# Patient Record
Sex: Male | Born: 1935 | Race: White | Hispanic: No | Smoking: Former smoker
Health system: Southern US, Community
[De-identification: ages and names within clinical notes are randomized; demographics above are authoritative.]

## PROBLEM LIST (undated history)

## (undated) DIAGNOSIS — E785 Hyperlipidemia, unspecified: Secondary | ICD-10-CM

## (undated) DIAGNOSIS — Z947 Corneal transplant status: Secondary | ICD-10-CM

## (undated) DIAGNOSIS — H2010 Chronic iridocyclitis, unspecified eye: Secondary | ICD-10-CM

## (undated) DIAGNOSIS — M25559 Pain in unspecified hip: Secondary | ICD-10-CM

## (undated) DIAGNOSIS — H5702 Anisocoria: Secondary | ICD-10-CM

## (undated) DIAGNOSIS — I63239 Cerebral infarction due to unspecified occlusion or stenosis of unspecified carotid arteries: Secondary | ICD-10-CM

## (undated) DIAGNOSIS — I10 Essential (primary) hypertension: Principal | ICD-10-CM

## (undated) DIAGNOSIS — H1851 Endothelial corneal dystrophy: Secondary | ICD-10-CM

## (undated) DIAGNOSIS — G459 Transient cerebral ischemic attack, unspecified: Secondary | ICD-10-CM

## (undated) DIAGNOSIS — R202 Paresthesia of skin: Secondary | ICD-10-CM

## (undated) DIAGNOSIS — L821 Other seborrheic keratosis: Secondary | ICD-10-CM

## (undated) DIAGNOSIS — H908 Mixed conductive and sensorineural hearing loss, unspecified: Secondary | ICD-10-CM

## (undated) DIAGNOSIS — R55 Syncope and collapse: Secondary | ICD-10-CM

## (undated) DIAGNOSIS — I498 Other specified cardiac arrhythmias: Secondary | ICD-10-CM

## (undated) DIAGNOSIS — Z961 Presence of intraocular lens: Secondary | ICD-10-CM

## (undated) DIAGNOSIS — I739 Peripheral vascular disease, unspecified: Secondary | ICD-10-CM

## (undated) DIAGNOSIS — H18519 Endothelial corneal dystrophy, unspecified eye: Secondary | ICD-10-CM

## (undated) DIAGNOSIS — R351 Nocturia: Secondary | ICD-10-CM

## (undated) DIAGNOSIS — H35359 Cystoid macular degeneration, unspecified eye: Secondary | ICD-10-CM

## (undated) DIAGNOSIS — L570 Actinic keratosis: Secondary | ICD-10-CM

## (undated) DIAGNOSIS — M542 Cervicalgia: Secondary | ICD-10-CM

## (undated) DIAGNOSIS — H401190 Primary open-angle glaucoma, unspecified eye, stage unspecified: Secondary | ICD-10-CM

## (undated) DIAGNOSIS — L719 Rosacea, unspecified: Secondary | ICD-10-CM

## (undated) DIAGNOSIS — M2040 Other hammer toe(s) (acquired), unspecified foot: Secondary | ICD-10-CM

## (undated) DIAGNOSIS — H353132 Nonexudative age-related macular degeneration, bilateral, intermediate dry stage: Secondary | ICD-10-CM

## (undated) DIAGNOSIS — R350 Frequency of micturition: Secondary | ICD-10-CM

## (undated) HISTORY — DX: Syncope and collapse: R55

## (undated) HISTORY — DX: Presence of intraocular lens: Z96.1

## (undated) HISTORY — DX: Essential (primary) hypertension: I10

## (undated) HISTORY — DX: Endothelial corneal dystrophy, unspecified eye: H18.519

## (undated) HISTORY — DX: Rosacea, unspecified: L71.9

## (undated) HISTORY — DX: Mixed conductive and sensorineural hearing loss, unspecified: H90.8

## (undated) HISTORY — PX: VASECTOMY: SHX75

## (undated) HISTORY — DX: Corneal transplant status: Z94.7

## (undated) HISTORY — DX: Frequency of micturition: R35.0

## (undated) HISTORY — DX: Nonexudative age-related macular degeneration, bilateral, intermediate dry stage: H35.3132

## (undated) HISTORY — DX: Anisocoria: H57.02

## (undated) HISTORY — DX: Peripheral vascular disease, unspecified: I73.9

## (undated) HISTORY — PX: CORNEAL TRANSPLANT: SHX108

## (undated) HISTORY — DX: Endothelial corneal dystrophy: H18.51

## (undated) HISTORY — DX: Hyperlipidemia, unspecified: E78.5

## (undated) HISTORY — DX: Nocturia: R35.1

## (undated) HISTORY — DX: Actinic keratosis: L57.0

## (undated) HISTORY — DX: Pain in unspecified hip: M25.559

## (undated) HISTORY — DX: Primary open-angle glaucoma, unspecified eye, stage unspecified: H40.1190

## (undated) HISTORY — DX: Cystoid macular degeneration, unspecified eye: H35.359

## (undated) HISTORY — DX: Cerebral infarction due to unspecified occlusion or stenosis of unspecified carotid artery: I63.239

## (undated) HISTORY — DX: Chronic iridocyclitis, unspecified eye: H20.10

## (undated) HISTORY — DX: Other hammer toe(s) (acquired), unspecified foot: M20.40

## (undated) HISTORY — DX: Cervicalgia: M54.2

## (undated) HISTORY — DX: Other specified cardiac arrhythmias: I49.8

## (undated) HISTORY — DX: Paresthesia of skin: R20.2

## (undated) HISTORY — DX: Transient cerebral ischemic attack, unspecified: G45.9

## (undated) HISTORY — PX: TONSILLECTOMY: SHX5217

## (undated) HISTORY — DX: Other seborrheic keratosis: L82.1

---

## 1983-07-21 HISTORY — PX: HERNIA REPAIR: SHX51

## 2003-07-21 HISTORY — PX: CATARACT EXTRACTION: SUR2

## 2003-07-21 HISTORY — PX: SHOULDER DEBRIDEMENT: SHX1052

## 2003-10-19 HISTORY — PX: TRANSURETHRAL RESECTION OF PROSTATE: SHX73

## 2004-03-20 HISTORY — PX: HERNIA REPAIR: SHX51

## 2011-09-15 DIAGNOSIS — M25569 Pain in unspecified knee: Secondary | ICD-10-CM | POA: Diagnosis not present

## 2011-09-15 DIAGNOSIS — I10 Essential (primary) hypertension: Secondary | ICD-10-CM | POA: Diagnosis not present

## 2011-09-25 DIAGNOSIS — H401190 Primary open-angle glaucoma, unspecified eye, stage unspecified: Secondary | ICD-10-CM | POA: Insufficient documentation

## 2011-09-25 DIAGNOSIS — H4011X0 Primary open-angle glaucoma, stage unspecified: Secondary | ICD-10-CM | POA: Diagnosis not present

## 2011-09-25 DIAGNOSIS — H409 Unspecified glaucoma: Secondary | ICD-10-CM | POA: Diagnosis not present

## 2011-09-25 HISTORY — DX: Primary open-angle glaucoma, unspecified eye, stage unspecified: H40.1190

## 2011-10-26 DIAGNOSIS — H4011X0 Primary open-angle glaucoma, stage unspecified: Secondary | ICD-10-CM | POA: Diagnosis not present

## 2011-10-26 DIAGNOSIS — H409 Unspecified glaucoma: Secondary | ICD-10-CM | POA: Diagnosis not present

## 2011-11-13 DIAGNOSIS — M2040 Other hammer toe(s) (acquired), unspecified foot: Secondary | ICD-10-CM | POA: Diagnosis not present

## 2011-11-23 DIAGNOSIS — L570 Actinic keratosis: Secondary | ICD-10-CM | POA: Diagnosis not present

## 2011-11-23 DIAGNOSIS — L821 Other seborrheic keratosis: Secondary | ICD-10-CM | POA: Diagnosis not present

## 2011-11-23 DIAGNOSIS — L719 Rosacea, unspecified: Secondary | ICD-10-CM | POA: Diagnosis not present

## 2011-11-23 DIAGNOSIS — D485 Neoplasm of uncertain behavior of skin: Secondary | ICD-10-CM | POA: Diagnosis not present

## 2011-12-07 DIAGNOSIS — H18519 Endothelial corneal dystrophy, unspecified eye: Secondary | ICD-10-CM | POA: Diagnosis not present

## 2011-12-07 DIAGNOSIS — I440 Atrioventricular block, first degree: Secondary | ICD-10-CM | POA: Diagnosis not present

## 2011-12-07 DIAGNOSIS — I498 Other specified cardiac arrhythmias: Secondary | ICD-10-CM | POA: Diagnosis not present

## 2011-12-10 DIAGNOSIS — L719 Rosacea, unspecified: Secondary | ICD-10-CM | POA: Diagnosis not present

## 2011-12-10 DIAGNOSIS — I10 Essential (primary) hypertension: Secondary | ICD-10-CM | POA: Diagnosis not present

## 2011-12-10 DIAGNOSIS — H409 Unspecified glaucoma: Secondary | ICD-10-CM | POA: Diagnosis not present

## 2011-12-10 DIAGNOSIS — Z87891 Personal history of nicotine dependence: Secondary | ICD-10-CM | POA: Diagnosis not present

## 2011-12-10 DIAGNOSIS — N400 Enlarged prostate without lower urinary tract symptoms: Secondary | ICD-10-CM | POA: Diagnosis not present

## 2011-12-10 DIAGNOSIS — Z7982 Long term (current) use of aspirin: Secondary | ICD-10-CM | POA: Diagnosis not present

## 2011-12-10 DIAGNOSIS — H18519 Endothelial corneal dystrophy, unspecified eye: Secondary | ICD-10-CM

## 2011-12-10 DIAGNOSIS — H4010X0 Unspecified open-angle glaucoma, stage unspecified: Secondary | ICD-10-CM | POA: Diagnosis not present

## 2011-12-10 HISTORY — DX: Endothelial corneal dystrophy, unspecified eye: H18.519

## 2011-12-16 DIAGNOSIS — H18519 Endothelial corneal dystrophy, unspecified eye: Secondary | ICD-10-CM | POA: Diagnosis not present

## 2011-12-22 DIAGNOSIS — M25519 Pain in unspecified shoulder: Secondary | ICD-10-CM | POA: Diagnosis not present

## 2012-01-19 DIAGNOSIS — M9981 Other biomechanical lesions of cervical region: Secondary | ICD-10-CM | POA: Diagnosis not present

## 2012-01-19 DIAGNOSIS — M545 Low back pain: Secondary | ICD-10-CM | POA: Diagnosis not present

## 2012-01-19 DIAGNOSIS — M999 Biomechanical lesion, unspecified: Secondary | ICD-10-CM | POA: Diagnosis not present

## 2012-01-23 DIAGNOSIS — M999 Biomechanical lesion, unspecified: Secondary | ICD-10-CM | POA: Diagnosis not present

## 2012-01-23 DIAGNOSIS — M545 Low back pain: Secondary | ICD-10-CM | POA: Diagnosis not present

## 2012-01-23 DIAGNOSIS — M9981 Other biomechanical lesions of cervical region: Secondary | ICD-10-CM | POA: Diagnosis not present

## 2012-01-25 DIAGNOSIS — T85398A Other mechanical complication of other ocular prosthetic devices, implants and grafts, initial encounter: Secondary | ICD-10-CM | POA: Diagnosis not present

## 2012-01-26 DIAGNOSIS — M545 Low back pain: Secondary | ICD-10-CM | POA: Diagnosis not present

## 2012-01-26 DIAGNOSIS — M9981 Other biomechanical lesions of cervical region: Secondary | ICD-10-CM | POA: Diagnosis not present

## 2012-01-26 DIAGNOSIS — M999 Biomechanical lesion, unspecified: Secondary | ICD-10-CM | POA: Diagnosis not present

## 2012-02-27 DIAGNOSIS — M545 Low back pain: Secondary | ICD-10-CM | POA: Diagnosis not present

## 2012-02-27 DIAGNOSIS — M9981 Other biomechanical lesions of cervical region: Secondary | ICD-10-CM | POA: Diagnosis not present

## 2012-02-27 DIAGNOSIS — M999 Biomechanical lesion, unspecified: Secondary | ICD-10-CM | POA: Diagnosis not present

## 2012-03-05 DIAGNOSIS — M545 Low back pain: Secondary | ICD-10-CM | POA: Diagnosis not present

## 2012-03-05 DIAGNOSIS — M9981 Other biomechanical lesions of cervical region: Secondary | ICD-10-CM | POA: Diagnosis not present

## 2012-03-05 DIAGNOSIS — M999 Biomechanical lesion, unspecified: Secondary | ICD-10-CM | POA: Diagnosis not present

## 2012-03-29 DIAGNOSIS — L821 Other seborrheic keratosis: Secondary | ICD-10-CM | POA: Diagnosis not present

## 2012-03-29 DIAGNOSIS — L719 Rosacea, unspecified: Secondary | ICD-10-CM | POA: Diagnosis not present

## 2012-03-29 DIAGNOSIS — L570 Actinic keratosis: Secondary | ICD-10-CM | POA: Diagnosis not present

## 2012-04-04 DIAGNOSIS — E785 Hyperlipidemia, unspecified: Secondary | ICD-10-CM | POA: Diagnosis not present

## 2012-04-04 DIAGNOSIS — I10 Essential (primary) hypertension: Secondary | ICD-10-CM | POA: Diagnosis not present

## 2012-04-06 DIAGNOSIS — H4011X0 Primary open-angle glaucoma, stage unspecified: Secondary | ICD-10-CM | POA: Diagnosis not present

## 2012-04-06 DIAGNOSIS — H409 Unspecified glaucoma: Secondary | ICD-10-CM | POA: Diagnosis not present

## 2012-04-06 DIAGNOSIS — I10 Essential (primary) hypertension: Secondary | ICD-10-CM | POA: Diagnosis not present

## 2012-04-12 DIAGNOSIS — E785 Hyperlipidemia, unspecified: Secondary | ICD-10-CM | POA: Diagnosis not present

## 2012-04-12 DIAGNOSIS — M25559 Pain in unspecified hip: Secondary | ICD-10-CM | POA: Diagnosis not present

## 2012-04-12 DIAGNOSIS — I10 Essential (primary) hypertension: Secondary | ICD-10-CM | POA: Diagnosis not present

## 2012-04-25 DIAGNOSIS — H409 Unspecified glaucoma: Secondary | ICD-10-CM | POA: Diagnosis not present

## 2012-04-25 DIAGNOSIS — Z947 Corneal transplant status: Secondary | ICD-10-CM | POA: Insufficient documentation

## 2012-04-25 DIAGNOSIS — H4011X0 Primary open-angle glaucoma, stage unspecified: Secondary | ICD-10-CM | POA: Diagnosis not present

## 2012-04-25 HISTORY — DX: Corneal transplant status: Z94.7

## 2012-06-10 DIAGNOSIS — H4011X0 Primary open-angle glaucoma, stage unspecified: Secondary | ICD-10-CM | POA: Diagnosis not present

## 2012-06-10 DIAGNOSIS — Z947 Corneal transplant status: Secondary | ICD-10-CM | POA: Diagnosis not present

## 2012-06-10 DIAGNOSIS — H409 Unspecified glaucoma: Secondary | ICD-10-CM | POA: Diagnosis not present

## 2012-07-18 ENCOUNTER — Ambulatory Visit (INDEPENDENT_AMBULATORY_CARE_PROVIDER_SITE_OTHER): Payer: Medicare Other | Admitting: Family Medicine

## 2012-07-18 VITALS — BP 151/73 | HR 63 | Temp 97.7°F | Resp 18 | Ht 68.5 in | Wt 155.6 lb

## 2012-07-18 DIAGNOSIS — G459 Transient cerebral ischemic attack, unspecified: Secondary | ICD-10-CM | POA: Diagnosis not present

## 2012-07-18 DIAGNOSIS — H539 Unspecified visual disturbance: Secondary | ICD-10-CM | POA: Diagnosis not present

## 2012-07-18 MED ORDER — ASPIRIN 81 MG PO TABS: 324.0000 mg | ORAL_TABLET | Freq: Every day | ORAL | Status: DC

## 2012-07-18 NOTE — Progress Notes (Signed)
Subjective:    Patient ID: Russell [name], male    DOB: Aug 31, 1926, 78 y.o.   MRN: [id number] Chief Complaint  Patient presents with  . Eye Problem    episode of visual disturbance lasted about 13 min (rt eye) has had prev episode same eye also some speech issues     HPI  Russell [name] is a delightful 78 yo male with a history of well controlled HTN - on hctz and enalapril and HPL - on niacin and lovastatin, and has taken asa 81mg  po qd for many years.  When he and his wife were driving home from [facility] today, from 10:13 - 10:26, he experience vision changes where he could see a checkerboard pattern out in his right field of vision and he had trouble with work finding.  He was able to recognize that he was using the wrong word though and immediately correct it and choose the right word - but this happened recurrently during these 13 minutes.  The checkerboard and speech problems resolved and he did notice for some time afterwards that he continued to have a visual field defect where he could not see an entire road sign, this gradually resolved back to normal. Russell [name] presented to my clinic - about 9-10 hrs after this episode and all sxs have completely resolved. He feels fine and currently has no complaints. His wife is with him who agrees that he appears to be at baseline. Pt had what was assumed to be a TIA in 2011 as he had a self-limited episode of poor word finding - sounds like a Wernicke's type aphasia - that lasted for about an hour. In this episode he was speaking almost complete jargon and could not find the correct words to correct himself as he had been able to during his episode today.  After this TIA in 2011, he underwent a CT scan which did not show any sig abnormality and retina scan.   Pt's PCP is Dr. Eulah Citizen w/ [facility].   His glaucoma specialist is Dr. Lottie Dawson and Dr. 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 Pt reports his baseline vision is 20/40 and 20/80 with glasses which pt does not have with him currently.   Past Medical History  Diagnosis Date  . Hypertension    Past Surgical History  Procedure Date  . Eye surgery   . Hernia repair   . Vasectomy    Current Outpatient Prescriptions on File Prior to Visit  Medication Sig Dispense Refill  . hydrochlorothiazide (HYDRODIURIL) 25 MG tablet Take 25 mg by mouth daily.       Not on File  Review of Systems  Constitutional: Negative for fever, chills, diaphoresis, activity change, appetite change and fatigue.  HENT: Positive for hearing loss. Negative for ear pain, congestion, sore throat, neck pain, neck stiffness, sinus pressure and ear discharge.   Eyes: Positive for visual disturbance. Negative for photophobia.  Respiratory: Negative for cough and shortness of breath.   Cardiovascular: Negative for chest pain and leg swelling.  Gastrointestinal: Negative for nausea, vomiting, abdominal pain, diarrhea and constipation.  Genitourinary: Negative for dysuria and decreased urine volume.  Musculoskeletal: Negative for myalgias, joint swelling and gait problem.  Skin: Negative for rash.  Neurological: Positive for speech difficulty. Negative for dizziness, tremors, seizures, syncope, facial asymmetry, weakness, light-headedness, numbness and headaches.  Psychiatric/Behavioral: Negative for sleep disturbance.  BP 151/73  Pulse 63  Temp 97.7 F (36.5 C) (Oral)  Resp 18  Ht 5' 8.5" (1.74 m)  Wt 155 lb 9.6 oz (70.58 kg)  BMI 23.31 kg/m2  SpO2 97% Objective:   Physical Exam  Constitutional: He is oriented to person, place, and time. He appears well-developed and well-nourished. No distress.  HENT:  Head: Normocephalic and atraumatic. No trismus in the jaw.  Right Ear: Tympanic membrane, external ear and ear canal normal.  Left Ear: Tympanic membrane, external ear and ear canal normal.  Nose: Nose normal.    Mouth/Throat: Uvula is midline, oropharynx is clear and moist and mucous membranes are normal. No uvula swelling. No oropharyngeal exudate.       Wearing bilateral hearing aides.  Eyes: Conjunctivae normal, EOM and lids are normal. Right eye exhibits no discharge. Left eye exhibits no discharge. Right conjunctiva has no hemorrhage. Left conjunctiva has no hemorrhage. No scleral icterus. Right eye exhibits normal extraocular motion and no nystagmus. Left eye exhibits normal extraocular motion and no nystagmus. Right pupil is round and reactive. Left pupil is not reactive. Left pupil is round. Pupils are unequal.       Left blown pupil, right constricted with light.  Neck: Trachea normal, normal range of motion and full passive range of motion without pain. Neck supple. Normal carotid pulses present. Carotid bruit is not present. No rigidity. Normal range of motion present. No mass and no thyromegaly present.  Cardiovascular: Normal rate, regular rhythm, S1 normal, S2 normal, normal heart sounds and intact distal pulses.   No murmur heard. Pulmonary/Chest: Effort normal and breath sounds normal. No respiratory distress.  Abdominal: Normal appearance.  Musculoskeletal: He exhibits no edema.       Right shoulder: He exhibits normal strength.       Left shoulder: He exhibits normal strength.       Right hip: He exhibits normal strength.       Left hip: He exhibits normal strength.  Lymphadenopathy:    He has no cervical adenopathy.       Right: No supraclavicular adenopathy present.       Left: No supraclavicular adenopathy present.  Neurological: He is alert and oriented to person, place, and time. He has normal strength. He displays no atrophy and no tremor. No cranial nerve deficit or sensory deficit. He exhibits normal muscle tone. He displays a negative Romberg sign. Coordination and gait normal.  Reflex Scores:      Bicep reflexes are 2+ on the right side and 2+ on the left side.       Brachioradialis reflexes are 2+ on the right side and 2+ on the left side.      Patellar reflexes are 3+ on the right side and 3+ on the left side.      Achilles reflexes are 3+ on the right side and 3+ on the left side. Skin: Skin is warm and dry. No rash noted. He is not diaphoretic. No erythema.  Psychiatric: He has a normal mood and affect. His behavior is normal. Judgment and thought content normal. His speech is not delayed and not slurred. Cognition and memory are normal. He exhibits normal recent memory and normal remote memory.      Assessment & Plan:  1. ?TIA - As this has been resolved for over 10 hrs now and pt's exam is completey normal, I do not think that he needs to undergo an urgent work-up tonight. I explained to the pt and his  wife that he is at increased risk for having a full CVA in the next 24-48 hours so he needs to be monitored very closely and at the first sign of any neurologic abnormality, they need to call 911 - do not wait to see if it will resolve on its own.  He may benefit from further work-up over the next 1-2 days with head CT or poss even MRI, consider holter, echo, encouraged pt to obtain carotid dopplers. None of these were ordered - pt would like to defer to his PCP. Pt plans to contact his PCP after the holiday to pursue further w/u and agrees to call or RTC if they would like to pursue eval sooner or have any other questions or concerns.  Start asa 325mg  po qd tonight.  2. HTN - BP at clinic decreased to 138/68 at recheck prior to discharge. Cont BP meds and monitor closely. Call if still elevated. Hydrate.

## 2012-07-19 DIAGNOSIS — M9981 Other biomechanical lesions of cervical region: Secondary | ICD-10-CM | POA: Diagnosis not present

## 2012-07-19 DIAGNOSIS — M999 Biomechanical lesion, unspecified: Secondary | ICD-10-CM | POA: Diagnosis not present

## 2012-07-19 DIAGNOSIS — M545 Low back pain: Secondary | ICD-10-CM | POA: Diagnosis not present

## 2012-07-19 DIAGNOSIS — M531 Cervicobrachial syndrome: Secondary | ICD-10-CM | POA: Diagnosis not present

## 2012-07-20 NOTE — Progress Notes (Signed)
Faxed OV notes to Dr A. Green w/confirmation.

## 2012-07-21 DIAGNOSIS — M545 Low back pain: Secondary | ICD-10-CM | POA: Diagnosis not present

## 2012-07-21 DIAGNOSIS — M9981 Other biomechanical lesions of cervical region: Secondary | ICD-10-CM | POA: Diagnosis not present

## 2012-07-21 DIAGNOSIS — M531 Cervicobrachial syndrome: Secondary | ICD-10-CM | POA: Diagnosis not present

## 2012-07-21 DIAGNOSIS — M999 Biomechanical lesion, unspecified: Secondary | ICD-10-CM | POA: Diagnosis not present

## 2012-07-26 DIAGNOSIS — M545 Low back pain: Secondary | ICD-10-CM | POA: Diagnosis not present

## 2012-07-26 DIAGNOSIS — M999 Biomechanical lesion, unspecified: Secondary | ICD-10-CM | POA: Diagnosis not present

## 2012-07-26 DIAGNOSIS — M9981 Other biomechanical lesions of cervical region: Secondary | ICD-10-CM | POA: Diagnosis not present

## 2012-07-26 DIAGNOSIS — M531 Cervicobrachial syndrome: Secondary | ICD-10-CM | POA: Diagnosis not present

## 2012-07-28 DIAGNOSIS — M531 Cervicobrachial syndrome: Secondary | ICD-10-CM | POA: Diagnosis not present

## 2012-07-28 DIAGNOSIS — M545 Low back pain: Secondary | ICD-10-CM | POA: Diagnosis not present

## 2012-07-28 DIAGNOSIS — M999 Biomechanical lesion, unspecified: Secondary | ICD-10-CM | POA: Diagnosis not present

## 2012-07-28 DIAGNOSIS — M9981 Other biomechanical lesions of cervical region: Secondary | ICD-10-CM | POA: Diagnosis not present

## 2012-08-01 DIAGNOSIS — M531 Cervicobrachial syndrome: Secondary | ICD-10-CM | POA: Diagnosis not present

## 2012-08-01 DIAGNOSIS — M9981 Other biomechanical lesions of cervical region: Secondary | ICD-10-CM | POA: Diagnosis not present

## 2012-08-01 DIAGNOSIS — M545 Low back pain: Secondary | ICD-10-CM | POA: Diagnosis not present

## 2012-08-01 DIAGNOSIS — M999 Biomechanical lesion, unspecified: Secondary | ICD-10-CM | POA: Diagnosis not present

## 2012-08-02 ENCOUNTER — Other Ambulatory Visit: Payer: Self-pay | Admitting: Internal Medicine

## 2012-08-02 DIAGNOSIS — G459 Transient cerebral ischemic attack, unspecified: Secondary | ICD-10-CM

## 2012-08-02 DIAGNOSIS — E785 Hyperlipidemia, unspecified: Secondary | ICD-10-CM | POA: Diagnosis not present

## 2012-08-02 DIAGNOSIS — Z8673 Personal history of transient ischemic attack (TIA), and cerebral infarction without residual deficits: Secondary | ICD-10-CM | POA: Diagnosis not present

## 2012-08-02 DIAGNOSIS — I10 Essential (primary) hypertension: Secondary | ICD-10-CM | POA: Diagnosis not present

## 2012-08-03 ENCOUNTER — Ambulatory Visit
Admission: RE | Admit: 2012-08-03 | Discharge: 2012-08-03 | Disposition: A | Discharge status: Home / Self Care | Payer: Medicare Other | Source: Ambulatory Visit | Attending: Internal Medicine | Admitting: Internal Medicine

## 2012-08-03 DIAGNOSIS — G459 Transient cerebral ischemic attack, unspecified: Secondary | ICD-10-CM

## 2012-08-03 DIAGNOSIS — I6529 Occlusion and stenosis of unspecified carotid artery: Secondary | ICD-10-CM | POA: Diagnosis not present

## 2012-08-03 MED ORDER — IOHEXOL 300 MG/ML  SOLN
75.0000 mL | Freq: Once | INTRAMUSCULAR | Status: AC | PRN
  Administered 2012-08-03: 75 mL via INTRAVENOUS

## 2012-08-04 DIAGNOSIS — M531 Cervicobrachial syndrome: Secondary | ICD-10-CM | POA: Diagnosis not present

## 2012-08-04 DIAGNOSIS — M9981 Other biomechanical lesions of cervical region: Secondary | ICD-10-CM | POA: Diagnosis not present

## 2012-08-04 DIAGNOSIS — M999 Biomechanical lesion, unspecified: Secondary | ICD-10-CM | POA: Diagnosis not present

## 2012-08-04 DIAGNOSIS — M545 Low back pain: Secondary | ICD-10-CM | POA: Diagnosis not present

## 2012-08-08 DIAGNOSIS — M531 Cervicobrachial syndrome: Secondary | ICD-10-CM | POA: Diagnosis not present

## 2012-08-08 DIAGNOSIS — M9981 Other biomechanical lesions of cervical region: Secondary | ICD-10-CM | POA: Diagnosis not present

## 2012-08-08 DIAGNOSIS — M545 Low back pain: Secondary | ICD-10-CM | POA: Diagnosis not present

## 2012-08-08 DIAGNOSIS — M999 Biomechanical lesion, unspecified: Secondary | ICD-10-CM | POA: Diagnosis not present

## 2012-08-11 DIAGNOSIS — M999 Biomechanical lesion, unspecified: Secondary | ICD-10-CM | POA: Diagnosis not present

## 2012-08-11 DIAGNOSIS — M545 Low back pain: Secondary | ICD-10-CM | POA: Diagnosis not present

## 2012-08-11 DIAGNOSIS — M9981 Other biomechanical lesions of cervical region: Secondary | ICD-10-CM | POA: Diagnosis not present

## 2012-08-11 DIAGNOSIS — M531 Cervicobrachial syndrome: Secondary | ICD-10-CM | POA: Diagnosis not present

## 2012-08-15 DIAGNOSIS — M531 Cervicobrachial syndrome: Secondary | ICD-10-CM | POA: Diagnosis not present

## 2012-08-15 DIAGNOSIS — M545 Low back pain: Secondary | ICD-10-CM | POA: Diagnosis not present

## 2012-08-15 DIAGNOSIS — M9981 Other biomechanical lesions of cervical region: Secondary | ICD-10-CM | POA: Diagnosis not present

## 2012-08-15 DIAGNOSIS — M999 Biomechanical lesion, unspecified: Secondary | ICD-10-CM | POA: Diagnosis not present

## 2012-08-17 DIAGNOSIS — G459 Transient cerebral ischemic attack, unspecified: Secondary | ICD-10-CM | POA: Diagnosis not present

## 2012-08-18 DIAGNOSIS — M545 Low back pain: Secondary | ICD-10-CM | POA: Diagnosis not present

## 2012-08-18 DIAGNOSIS — G459 Transient cerebral ischemic attack, unspecified: Secondary | ICD-10-CM | POA: Diagnosis not present

## 2012-08-18 DIAGNOSIS — M999 Biomechanical lesion, unspecified: Secondary | ICD-10-CM | POA: Diagnosis not present

## 2012-08-18 DIAGNOSIS — M9981 Other biomechanical lesions of cervical region: Secondary | ICD-10-CM | POA: Diagnosis not present

## 2012-08-18 DIAGNOSIS — M531 Cervicobrachial syndrome: Secondary | ICD-10-CM | POA: Diagnosis not present

## 2012-08-22 DIAGNOSIS — M999 Biomechanical lesion, unspecified: Secondary | ICD-10-CM | POA: Diagnosis not present

## 2012-08-22 DIAGNOSIS — M9981 Other biomechanical lesions of cervical region: Secondary | ICD-10-CM | POA: Diagnosis not present

## 2012-08-22 DIAGNOSIS — M531 Cervicobrachial syndrome: Secondary | ICD-10-CM | POA: Diagnosis not present

## 2012-08-22 DIAGNOSIS — M545 Low back pain: Secondary | ICD-10-CM | POA: Diagnosis not present

## 2012-08-29 DIAGNOSIS — M999 Biomechanical lesion, unspecified: Secondary | ICD-10-CM | POA: Diagnosis not present

## 2012-08-29 DIAGNOSIS — M531 Cervicobrachial syndrome: Secondary | ICD-10-CM | POA: Diagnosis not present

## 2012-08-29 DIAGNOSIS — M545 Low back pain: Secondary | ICD-10-CM | POA: Diagnosis not present

## 2012-08-29 DIAGNOSIS — M9981 Other biomechanical lesions of cervical region: Secondary | ICD-10-CM | POA: Diagnosis not present

## 2012-08-30 DIAGNOSIS — I10 Essential (primary) hypertension: Secondary | ICD-10-CM | POA: Diagnosis not present

## 2012-08-30 DIAGNOSIS — Z8673 Personal history of transient ischemic attack (TIA), and cerebral infarction without residual deficits: Secondary | ICD-10-CM | POA: Diagnosis not present

## 2012-09-02 ENCOUNTER — Encounter: Payer: Self-pay | Admitting: *Deleted

## 2012-09-03 ENCOUNTER — Other Ambulatory Visit: Payer: Self-pay

## 2012-09-05 DIAGNOSIS — M999 Biomechanical lesion, unspecified: Secondary | ICD-10-CM | POA: Diagnosis not present

## 2012-09-05 DIAGNOSIS — M545 Low back pain: Secondary | ICD-10-CM | POA: Diagnosis not present

## 2012-09-05 DIAGNOSIS — M9981 Other biomechanical lesions of cervical region: Secondary | ICD-10-CM | POA: Diagnosis not present

## 2012-09-05 DIAGNOSIS — M531 Cervicobrachial syndrome: Secondary | ICD-10-CM | POA: Diagnosis not present

## 2012-09-12 DIAGNOSIS — M9981 Other biomechanical lesions of cervical region: Secondary | ICD-10-CM | POA: Diagnosis not present

## 2012-09-12 DIAGNOSIS — M531 Cervicobrachial syndrome: Secondary | ICD-10-CM | POA: Diagnosis not present

## 2012-09-12 DIAGNOSIS — M999 Biomechanical lesion, unspecified: Secondary | ICD-10-CM | POA: Diagnosis not present

## 2012-09-19 DIAGNOSIS — M545 Low back pain: Secondary | ICD-10-CM | POA: Diagnosis not present

## 2012-09-19 DIAGNOSIS — M999 Biomechanical lesion, unspecified: Secondary | ICD-10-CM | POA: Diagnosis not present

## 2012-09-19 DIAGNOSIS — M9981 Other biomechanical lesions of cervical region: Secondary | ICD-10-CM | POA: Diagnosis not present

## 2012-09-19 DIAGNOSIS — M531 Cervicobrachial syndrome: Secondary | ICD-10-CM | POA: Diagnosis not present

## 2012-09-26 DIAGNOSIS — L570 Actinic keratosis: Secondary | ICD-10-CM | POA: Diagnosis not present

## 2012-09-26 DIAGNOSIS — M9981 Other biomechanical lesions of cervical region: Secondary | ICD-10-CM | POA: Diagnosis not present

## 2012-09-26 DIAGNOSIS — M531 Cervicobrachial syndrome: Secondary | ICD-10-CM | POA: Diagnosis not present

## 2012-09-26 DIAGNOSIS — M999 Biomechanical lesion, unspecified: Secondary | ICD-10-CM | POA: Diagnosis not present

## 2012-09-26 DIAGNOSIS — L719 Rosacea, unspecified: Secondary | ICD-10-CM | POA: Diagnosis not present

## 2012-09-26 DIAGNOSIS — M545 Low back pain: Secondary | ICD-10-CM | POA: Diagnosis not present

## 2012-09-26 DIAGNOSIS — L821 Other seborrheic keratosis: Secondary | ICD-10-CM | POA: Diagnosis not present

## 2012-10-03 DIAGNOSIS — M999 Biomechanical lesion, unspecified: Secondary | ICD-10-CM | POA: Diagnosis not present

## 2012-10-03 DIAGNOSIS — M9981 Other biomechanical lesions of cervical region: Secondary | ICD-10-CM | POA: Diagnosis not present

## 2012-10-03 DIAGNOSIS — M545 Low back pain: Secondary | ICD-10-CM | POA: Diagnosis not present

## 2012-10-03 DIAGNOSIS — M531 Cervicobrachial syndrome: Secondary | ICD-10-CM | POA: Diagnosis not present

## 2012-10-10 DIAGNOSIS — I10 Essential (primary) hypertension: Secondary | ICD-10-CM | POA: Diagnosis not present

## 2012-10-10 DIAGNOSIS — E785 Hyperlipidemia, unspecified: Secondary | ICD-10-CM | POA: Diagnosis not present

## 2012-10-10 DIAGNOSIS — Z947 Corneal transplant status: Secondary | ICD-10-CM | POA: Diagnosis not present

## 2012-10-11 ENCOUNTER — Other Ambulatory Visit: Payer: Self-pay | Admitting: *Deleted

## 2012-10-11 ENCOUNTER — Encounter: Payer: Self-pay | Admitting: Internal Medicine

## 2012-10-11 MED ORDER — ENALAPRIL MALEATE 20 MG PO TABS: ORAL_TABLET | ORAL | Status: DC

## 2012-10-11 MED ORDER — HYDROCHLOROTHIAZIDE 25 MG PO TABS: ORAL_TABLET | ORAL | Status: DC

## 2012-10-13 DIAGNOSIS — H4011X0 Primary open-angle glaucoma, stage unspecified: Secondary | ICD-10-CM | POA: Diagnosis not present

## 2012-10-13 DIAGNOSIS — H409 Unspecified glaucoma: Secondary | ICD-10-CM | POA: Diagnosis not present

## 2012-10-14 DIAGNOSIS — L570 Actinic keratosis: Secondary | ICD-10-CM | POA: Diagnosis not present

## 2012-10-17 DIAGNOSIS — M531 Cervicobrachial syndrome: Secondary | ICD-10-CM | POA: Diagnosis not present

## 2012-10-17 DIAGNOSIS — M9981 Other biomechanical lesions of cervical region: Secondary | ICD-10-CM | POA: Diagnosis not present

## 2012-10-17 DIAGNOSIS — M999 Biomechanical lesion, unspecified: Secondary | ICD-10-CM | POA: Diagnosis not present

## 2012-10-17 DIAGNOSIS — M545 Low back pain: Secondary | ICD-10-CM | POA: Diagnosis not present

## 2012-10-18 ENCOUNTER — Non-Acute Institutional Stay: Payer: Medicare Other | Admitting: Internal Medicine

## 2012-10-18 ENCOUNTER — Encounter: Payer: Self-pay | Admitting: Internal Medicine

## 2012-10-18 VITALS — BP 100/58 | HR 56 | Ht 68.5 in | Wt 156.0 lb

## 2012-10-18 DIAGNOSIS — I498 Other specified cardiac arrhythmias: Secondary | ICD-10-CM | POA: Insufficient documentation

## 2012-10-18 DIAGNOSIS — E785 Hyperlipidemia, unspecified: Secondary | ICD-10-CM

## 2012-10-18 DIAGNOSIS — H908 Mixed conductive and sensorineural hearing loss, unspecified: Secondary | ICD-10-CM | POA: Diagnosis not present

## 2012-10-18 DIAGNOSIS — I10 Essential (primary) hypertension: Secondary | ICD-10-CM

## 2012-10-18 DIAGNOSIS — G459 Transient cerebral ischemic attack, unspecified: Secondary | ICD-10-CM

## 2012-10-18 DIAGNOSIS — Z8673 Personal history of transient ischemic attack (TIA), and cerebral infarction without residual deficits: Secondary | ICD-10-CM | POA: Diagnosis not present

## 2012-10-18 DIAGNOSIS — L570 Actinic keratosis: Secondary | ICD-10-CM

## 2012-10-18 DIAGNOSIS — L719 Rosacea, unspecified: Secondary | ICD-10-CM | POA: Insufficient documentation

## 2012-10-18 DIAGNOSIS — M25559 Pain in unspecified hip: Secondary | ICD-10-CM | POA: Insufficient documentation

## 2012-10-18 HISTORY — DX: Hyperlipidemia, unspecified: E78.5

## 2012-10-18 HISTORY — DX: Essential (primary) hypertension: I10

## 2012-10-18 HISTORY — DX: Mixed conductive and sensorineural hearing loss, unspecified: H90.8

## 2012-10-18 HISTORY — DX: Transient cerebral ischemic attack, unspecified: G45.9

## 2012-10-18 HISTORY — DX: Actinic keratosis: L57.0

## 2012-10-18 NOTE — Patient Instructions (Signed)
Continue current medications. 

## 2012-10-18 NOTE — Progress Notes (Signed)
Patient ID: Russell [name], male   DOB: 1927-01-19, 79 y.o.   MRN: [id number]  History and Physical  Luis Nickles ZHY:[id number] DOB: 1927/02/15   PCP: Kimber Relic, MD   Chief Complaint: Patient is here for a complete exam and review of his chronic medical conditions HPI:  Says he has had 2 TIAs. Last one  Was seen in ER 07/18/12.  When he bends over, he gets flickers of light in his eyes.  Review of Systems  Constitutional: Negative for fever, chills, weight loss, malaise/fatigue and diaphoresis.  HENT: Positive for hearing loss and neck pain. Negative for ear pain, nosebleeds, congestion, sore throat, tinnitus and ear discharge.        Wears hearing aid.  Eyes: Negative for blurred vision, double vision, photophobia, pain and discharge.       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 Can drive. Some visual impairment with a hazy film. Difficult to focus.  Respiratory: Negative for cough, hemoptysis, sputum production, shortness of breath, wheezing and stridor.   Cardiovascular: Negative for chest pain, palpitations, orthopnea, claudication, leg swelling and PND.  Gastrointestinal: Negative for heartburn, nausea, vomiting, abdominal pain, diarrhea, constipation, blood in stool and melena.  Genitourinary: Negative for dysuria, urgency, frequency, hematuria and flank pain.       Diminished stream  Musculoskeletal: Negative for myalgias, back pain, joint pain and falls.       Seeing a chiropractor, Dr. Lucretia Field, for neck discomfort. Has had right hip discomfort, but this is better.  Skin: Negative for itching and rash.       Cryotherapy for AK by Dr. Everlene Farrier, derm.  Neurological: Negative for dizziness, tingling, tremors, sensory change, speech change, focal weakness, seizures, loss of consciousness, weakness and headaches.  Endo/Heme/Allergies: Negative for environmental allergies and polydipsia. Does not bruise/bleed easily.  Psychiatric/Behavioral: Negative for depression, suicidal ideas,  hallucinations, memory loss and substance abuse. The patient is not nervous/anxious and does not have insomnia.      Past Medical History  Diagnosis Date  . Hypertension   . Occlusion and stenosis of carotid artery with cerebral infarction   . Peripheral vascular disease, unspecified   . Other specified cardiac dysrhythmias   . Other and unspecified hyperlipidemia   . Endothelial corneal dystrophy   . Rosacea   . Unspecified glaucoma(365.9)   . Pain in joint, pelvic region and thigh    Past Surgical History  Procedure Laterality Date  . Eye surgery    . Vasectomy    . Hernia repair  1985    Left  . Hernia repair  03/2004    Right  . Corneal transplant  2010 left; 2013 right eye   Social History:  reports that he quit smoking about 48 years ago. He has never used smokeless tobacco. He reports that he  drinks alcohol. He does not use illicit drugs. Retired. Previously employed as a Sport and exercise psychologist. Continues to volunteer his time at [facility]. Spends summers in [place]. Married Housing: Lives in an apartment at [facility]. There February 2013. Previously lived in [facility] in [place]. Exercise: Patient walks, bikes, works out in [employer] He does hiking and biking during the summers. Stress issues: Wife was diagnosed with cancer in 2011.  No Known Allergies  Family History  Problem Relation Age of Onset  . Cancer Mother   . Heart disease Father     Prior to Admission medications   Medication Sig Start Date End Date Taking? Authorizing Provider  aspirin 81  MG tablet Take 4 tablets (324 mg total) by mouth daily. 07/18/12  Yes Sherren Mocha, MD  brimonidine-timolol (COMBIGAN) 0.2-0.5 % ophthalmic solution Place 1 drop into both eyes every 12 (twelve) hours.   Yes Historical Provider, MD  doxycycline (VIBRAMYCIN) 100 MG capsule Take 100 mg by mouth. One tablet three times a week   Yes Historical Provider, MD  enalapril (VASOTEC) 20 MG tablet Take one  tablet twice daily to control blood pressure. 10/11/12  Yes Kimber Relic, MD  hydrochlorothiazide (HYDRODIURIL) 25 MG tablet Take one tablet  a day for blood pressure. 10/11/12  Yes Kimber Relic, MD  loteprednol (LOTEMAX) 0.5 % ophthalmic suspension Place 1 drop into both eyes. In morning   Yes Historical Provider, MD  lovastatin (MEVACOR) 40 MG tablet Take one tablet once daily for cholesterol.   Yes Historical Provider, MD  niacin 500 MG tablet Take 500 mg by mouth daily with breakfast.   Yes Historical Provider, MD   Procedures:  2009 colonoscopy: History of polyps 08/06/2012 CT brain: Mild age-related changes 08/17/2012 2-D echocardiogram: LVEF 65%. Mild aortic regurgitation. Trace mitral regurgitation. Mild tricuspid regurgitation. 08/21/12 carotid Doppler: Heterogeneous nonobstructing plaque in the LICA and RICA. Left bulb has heterogeneous plaque. No stenoses evident.  Consultants: Derm: Dr. Everlene Farrier Chiropractor: Dr. Lucretia Field Ophthalmology: Dr. Deidre Ala  Physical Exam:  Filed Vitals:   10/18/12 1009  BP: 100/58  Pulse: 56  Height: 5' 8.5" (1.74 m)  Weight: 156 lb (70.761 kg)    Constitutional: Vital signs reviewed.  Patient is well-developed and well-nourished in no acute distress and cooperative with exam. He appears physiologically younger than his stated years of age Skin: Warm, dry and intact. No rash, cyanosis, or clubbing.  Head: Normocephalic and atraumatic Ear: TM normal bilaterally. There is some wax partially obstructing the external auditory canals bilaterally. There is diminished hearing bilaterally. He generally wears a hearing aid but does not have these in place today. Mouth: no erythema or exudates. Full dentures Eyes: PERRL, EOMI, conjunctival clouding, No scleral icterus.  Neck: Stiff. Tender to palpation in the left neck. Trachea midline normal ROM, No JVD, mass, thyromegaly, or carotid bruit present.  Cardiovascular: RRR, S1 normal, S2 normal. Pulses  symmetric and intact bilaterally Pulmonary/Chest: no wheezes, rales, or rhonchi Abdominal: Soft. Non-tender, non-distended, bowel sounds are normal, no masses, organomegaly, or guarding present.  Rectal: NST, Small prostate, Stool heme negative GU: no CVA tenderness or bladder distention. Prostate normal Musculoskeletal: No joint deformities, erythema, or stiffness, ROM full and no nontender Ext: no edema and no cyanosis, pulses palpable bilaterally (DP and PT). Hammertoe on the left foot. ptosis or scoliosis. No tenderness in the back. Crepitance in both shoulders with rotation. Hematology: no cervical, inginal, or axillary adenopathy.  Neurological: A&O x3, Strenght is normal and symmetric bilaterally, cranial nerve II-XII are grossly intact, no focal motor deficit, sensory intact to light touch bilaterally.  Psychiatric: Normal mood and affect. speech and behavior is normal. Judgment and thought content normal. Cognition and memory are normal.   Labs: 10/10/12 CMP: nl  Lipids: T. Chol 129, trig 79, HDL 36, LDL 77  Assessment/Plan 401.9 hypertension: Well controlled 272.4 hyperlipidemia: Well-controlled V 12.54 personal history TIA: 2 episodes (2011, December 2013. Ttaking aspirin regularly.  Code Status: living will. 5 Wishes.  GREEN, ARTHUR G  10/18/2012, 10:27 AM

## 2012-10-31 DIAGNOSIS — M531 Cervicobrachial syndrome: Secondary | ICD-10-CM | POA: Diagnosis not present

## 2012-10-31 DIAGNOSIS — M999 Biomechanical lesion, unspecified: Secondary | ICD-10-CM | POA: Diagnosis not present

## 2012-10-31 DIAGNOSIS — M9981 Other biomechanical lesions of cervical region: Secondary | ICD-10-CM | POA: Diagnosis not present

## 2012-10-31 DIAGNOSIS — M461 Sacroiliitis, not elsewhere classified: Secondary | ICD-10-CM | POA: Diagnosis not present

## 2012-10-31 DIAGNOSIS — M545 Low back pain: Secondary | ICD-10-CM | POA: Diagnosis not present

## 2012-10-31 DIAGNOSIS — I10 Essential (primary) hypertension: Secondary | ICD-10-CM | POA: Diagnosis not present

## 2012-11-01 ENCOUNTER — Encounter: Payer: Self-pay | Admitting: Internal Medicine

## 2012-11-15 DIAGNOSIS — H35359 Cystoid macular degeneration, unspecified eye: Secondary | ICD-10-CM | POA: Diagnosis not present

## 2012-11-18 DIAGNOSIS — H35359 Cystoid macular degeneration, unspecified eye: Secondary | ICD-10-CM

## 2012-11-18 DIAGNOSIS — H2010 Chronic iridocyclitis, unspecified eye: Secondary | ICD-10-CM | POA: Insufficient documentation

## 2012-11-18 HISTORY — DX: Chronic iridocyclitis, unspecified eye: H20.10

## 2012-11-18 HISTORY — DX: Cystoid macular degeneration, unspecified eye: H35.359

## 2012-11-21 DIAGNOSIS — M531 Cervicobrachial syndrome: Secondary | ICD-10-CM | POA: Diagnosis not present

## 2012-11-21 DIAGNOSIS — M9981 Other biomechanical lesions of cervical region: Secondary | ICD-10-CM | POA: Diagnosis not present

## 2012-11-21 DIAGNOSIS — H4011X0 Primary open-angle glaucoma, stage unspecified: Secondary | ICD-10-CM | POA: Diagnosis not present

## 2012-11-21 DIAGNOSIS — Z947 Corneal transplant status: Secondary | ICD-10-CM | POA: Diagnosis not present

## 2012-11-21 DIAGNOSIS — H409 Unspecified glaucoma: Secondary | ICD-10-CM | POA: Diagnosis not present

## 2012-11-21 DIAGNOSIS — I10 Essential (primary) hypertension: Secondary | ICD-10-CM | POA: Diagnosis not present

## 2012-11-21 DIAGNOSIS — M545 Low back pain: Secondary | ICD-10-CM | POA: Diagnosis not present

## 2012-11-21 DIAGNOSIS — M999 Biomechanical lesion, unspecified: Secondary | ICD-10-CM | POA: Diagnosis not present

## 2012-11-21 DIAGNOSIS — H2010 Chronic iridocyclitis, unspecified eye: Secondary | ICD-10-CM | POA: Diagnosis not present

## 2012-11-21 DIAGNOSIS — M461 Sacroiliitis, not elsewhere classified: Secondary | ICD-10-CM | POA: Diagnosis not present

## 2012-12-06 DIAGNOSIS — H2010 Chronic iridocyclitis, unspecified eye: Secondary | ICD-10-CM | POA: Diagnosis not present

## 2012-12-06 DIAGNOSIS — H35359 Cystoid macular degeneration, unspecified eye: Secondary | ICD-10-CM | POA: Diagnosis not present

## 2012-12-06 DIAGNOSIS — H409 Unspecified glaucoma: Secondary | ICD-10-CM | POA: Diagnosis not present

## 2012-12-06 DIAGNOSIS — H4011X0 Primary open-angle glaucoma, stage unspecified: Secondary | ICD-10-CM | POA: Diagnosis not present

## 2012-12-14 DIAGNOSIS — L570 Actinic keratosis: Secondary | ICD-10-CM | POA: Diagnosis not present

## 2012-12-14 DIAGNOSIS — D485 Neoplasm of uncertain behavior of skin: Secondary | ICD-10-CM | POA: Diagnosis not present

## 2012-12-15 DIAGNOSIS — M461 Sacroiliitis, not elsewhere classified: Secondary | ICD-10-CM | POA: Diagnosis not present

## 2012-12-15 DIAGNOSIS — M531 Cervicobrachial syndrome: Secondary | ICD-10-CM | POA: Diagnosis not present

## 2012-12-15 DIAGNOSIS — M999 Biomechanical lesion, unspecified: Secondary | ICD-10-CM | POA: Diagnosis not present

## 2012-12-15 DIAGNOSIS — M545 Low back pain: Secondary | ICD-10-CM | POA: Diagnosis not present

## 2012-12-15 DIAGNOSIS — M9981 Other biomechanical lesions of cervical region: Secondary | ICD-10-CM | POA: Diagnosis not present

## 2012-12-15 DIAGNOSIS — I10 Essential (primary) hypertension: Secondary | ICD-10-CM | POA: Diagnosis not present

## 2013-01-10 DIAGNOSIS — H4011X0 Primary open-angle glaucoma, stage unspecified: Secondary | ICD-10-CM | POA: Diagnosis not present

## 2013-01-10 DIAGNOSIS — Z947 Corneal transplant status: Secondary | ICD-10-CM | POA: Diagnosis not present

## 2013-01-10 DIAGNOSIS — H2010 Chronic iridocyclitis, unspecified eye: Secondary | ICD-10-CM | POA: Diagnosis not present

## 2013-01-10 DIAGNOSIS — I10 Essential (primary) hypertension: Secondary | ICD-10-CM | POA: Diagnosis not present

## 2013-01-10 DIAGNOSIS — H409 Unspecified glaucoma: Secondary | ICD-10-CM | POA: Diagnosis not present

## 2013-01-10 DIAGNOSIS — H35359 Cystoid macular degeneration, unspecified eye: Secondary | ICD-10-CM | POA: Diagnosis not present

## 2013-02-22 ENCOUNTER — Other Ambulatory Visit: Payer: Self-pay

## 2013-04-13 DIAGNOSIS — M5137 Other intervertebral disc degeneration, lumbosacral region: Secondary | ICD-10-CM | POA: Diagnosis not present

## 2013-04-13 DIAGNOSIS — M9981 Other biomechanical lesions of cervical region: Secondary | ICD-10-CM | POA: Diagnosis not present

## 2013-04-13 DIAGNOSIS — M5030 Other cervical disc degeneration, unspecified cervical region: Secondary | ICD-10-CM | POA: Diagnosis not present

## 2013-04-13 DIAGNOSIS — I10 Essential (primary) hypertension: Secondary | ICD-10-CM | POA: Diagnosis not present

## 2013-04-13 DIAGNOSIS — M999 Biomechanical lesion, unspecified: Secondary | ICD-10-CM | POA: Diagnosis not present

## 2013-04-13 DIAGNOSIS — M461 Sacroiliitis, not elsewhere classified: Secondary | ICD-10-CM | POA: Diagnosis not present

## 2013-04-17 DIAGNOSIS — M5137 Other intervertebral disc degeneration, lumbosacral region: Secondary | ICD-10-CM | POA: Diagnosis not present

## 2013-04-17 DIAGNOSIS — M9981 Other biomechanical lesions of cervical region: Secondary | ICD-10-CM | POA: Diagnosis not present

## 2013-04-17 DIAGNOSIS — M999 Biomechanical lesion, unspecified: Secondary | ICD-10-CM | POA: Diagnosis not present

## 2013-04-17 DIAGNOSIS — I10 Essential (primary) hypertension: Secondary | ICD-10-CM | POA: Diagnosis not present

## 2013-04-17 DIAGNOSIS — M5030 Other cervical disc degeneration, unspecified cervical region: Secondary | ICD-10-CM | POA: Diagnosis not present

## 2013-04-17 DIAGNOSIS — M461 Sacroiliitis, not elsewhere classified: Secondary | ICD-10-CM | POA: Diagnosis not present

## 2013-04-18 DIAGNOSIS — I10 Essential (primary) hypertension: Secondary | ICD-10-CM | POA: Diagnosis not present

## 2013-04-18 DIAGNOSIS — H409 Unspecified glaucoma: Secondary | ICD-10-CM | POA: Diagnosis not present

## 2013-04-18 DIAGNOSIS — Z947 Corneal transplant status: Secondary | ICD-10-CM | POA: Diagnosis not present

## 2013-04-18 DIAGNOSIS — H4011X0 Primary open-angle glaucoma, stage unspecified: Secondary | ICD-10-CM | POA: Diagnosis not present

## 2013-04-18 DIAGNOSIS — H2010 Chronic iridocyclitis, unspecified eye: Secondary | ICD-10-CM | POA: Diagnosis not present

## 2013-04-18 DIAGNOSIS — H35359 Cystoid macular degeneration, unspecified eye: Secondary | ICD-10-CM | POA: Diagnosis not present

## 2013-04-19 DIAGNOSIS — L570 Actinic keratosis: Secondary | ICD-10-CM | POA: Diagnosis not present

## 2013-04-20 DIAGNOSIS — M5137 Other intervertebral disc degeneration, lumbosacral region: Secondary | ICD-10-CM | POA: Diagnosis not present

## 2013-04-20 DIAGNOSIS — M461 Sacroiliitis, not elsewhere classified: Secondary | ICD-10-CM | POA: Diagnosis not present

## 2013-04-20 DIAGNOSIS — M5030 Other cervical disc degeneration, unspecified cervical region: Secondary | ICD-10-CM | POA: Diagnosis not present

## 2013-04-20 DIAGNOSIS — I10 Essential (primary) hypertension: Secondary | ICD-10-CM | POA: Diagnosis not present

## 2013-04-20 DIAGNOSIS — M9981 Other biomechanical lesions of cervical region: Secondary | ICD-10-CM | POA: Diagnosis not present

## 2013-04-20 DIAGNOSIS — M999 Biomechanical lesion, unspecified: Secondary | ICD-10-CM | POA: Diagnosis not present

## 2013-04-24 DIAGNOSIS — M461 Sacroiliitis, not elsewhere classified: Secondary | ICD-10-CM | POA: Diagnosis not present

## 2013-04-24 DIAGNOSIS — M5137 Other intervertebral disc degeneration, lumbosacral region: Secondary | ICD-10-CM | POA: Diagnosis not present

## 2013-04-24 DIAGNOSIS — M9981 Other biomechanical lesions of cervical region: Secondary | ICD-10-CM | POA: Diagnosis not present

## 2013-04-24 DIAGNOSIS — M5030 Other cervical disc degeneration, unspecified cervical region: Secondary | ICD-10-CM | POA: Diagnosis not present

## 2013-04-24 DIAGNOSIS — I10 Essential (primary) hypertension: Secondary | ICD-10-CM | POA: Diagnosis not present

## 2013-04-24 DIAGNOSIS — M999 Biomechanical lesion, unspecified: Secondary | ICD-10-CM | POA: Diagnosis not present

## 2013-04-25 ENCOUNTER — Encounter: Payer: Self-pay | Admitting: Internal Medicine

## 2013-04-25 ENCOUNTER — Non-Acute Institutional Stay: Payer: Medicare Other | Admitting: Internal Medicine

## 2013-04-25 VITALS — BP 102/58 | HR 60 | Ht 68.5 in | Wt 156.0 lb

## 2013-04-25 DIAGNOSIS — M542 Cervicalgia: Secondary | ICD-10-CM | POA: Diagnosis not present

## 2013-04-25 DIAGNOSIS — E785 Hyperlipidemia, unspecified: Secondary | ICD-10-CM | POA: Diagnosis not present

## 2013-04-25 DIAGNOSIS — I10 Essential (primary) hypertension: Secondary | ICD-10-CM

## 2013-04-25 HISTORY — DX: Cervicalgia: M54.2

## 2013-04-25 MED ORDER — ENALAPRIL MALEATE 20 MG PO TABS: ORAL_TABLET | ORAL | Status: DC

## 2013-04-25 NOTE — Progress Notes (Signed)
Subjective:    Patient ID: Russell [name], male    DOB: [birth year], 79 y.o.   MRN: [id number]   Chief Complaint  Patient presents with  . Medical Managment of Chronic Issues    blood pressure, TIA, cholesterol    HPI Cervicalgia: Having reduced motion in his neck for several months. Left side is worse. Massage therapy gave minor relief. Currently seeing a chiropractor. Having little success.Would like to try PT  Nocturia x 4. Prior hx of TURP in 2005. He does not want a new medication or a referral to urologist yet.  Had question about whether oranges affect his medications. I advised him they are safe to eat.  Unspecified essential hypertension: controlled and now low  Other and unspecified hyperlipidemia: needs recheck      Current Outpatient Prescriptions on File Prior to Visit  Medication Sig Dispense Refill  . aspirin 81 MG tablet Take 4 tablets (324 mg total) by mouth daily.  30 tablet  0  . brimonidine-timolol (COMBIGAN) 0.2-0.5 % ophthalmic solution Place 1 drop into both eyes every 12 (twelve) hours.      Marland Kitchen doxycycline (VIBRAMYCIN) 100 MG capsule Take 100 mg by mouth. One tablet three times a week      . enalapril (VASOTEC) 20 MG tablet Take one tablet twice daily to control blood pressure.  180 tablet  3  . hydrochlorothiazide (HYDRODIURIL) 25 MG tablet Take one tablet  a day for blood pressure.      . loteprednol (LOTEMAX) 0.5 % ophthalmic suspension Place 1 drop into both eyes. In morning      . lovastatin (MEVACOR) 40 MG tablet Take one tablet once daily for cholesterol.      . niacin 500 MG tablet Take 500 mg by mouth daily with breakfast.       No current facility-administered medications on file prior to visit.    Review of Systems  Constitutional: Negative for fever, activity change, appetite change and fatigue.  HENT: Positive for hearing loss. Negative for ear pain.   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 Has a visual impairment with a  hazy film. Difficult to focus.  Respiratory: Negative for apnea, cough, choking, chest tightness and wheezing.   Cardiovascular: Negative for chest pain, palpitations and leg swelling.  Gastrointestinal: Negative for nausea, abdominal pain, diarrhea, constipation and abdominal distention.  Endocrine: Negative.   Genitourinary:       Weak stream.  Musculoskeletal:       Neck pains.  Skin:       Has seen Dr. Everlene Farrier for cryotherapy for AK.  Allergic/Immunologic: Negative.   Neurological: Negative.   Hematological: Negative.   Psychiatric/Behavioral: Negative.        Objective:BP 102/58  Pulse 60  Ht 5' 8.5" (1.74 m)  Wt 156 lb (70.761 kg)  BMI 23.37 kg/m2    Physical Exam  Constitutional: He is oriented to person, place, and time. He appears well-developed and well-nourished. No distress.  HENT:  Head: Normocephalic and atraumatic.  Right Ear: External ear normal.  Left Ear: External ear normal.  Nose: Nose normal.  Mouth/Throat: Oropharynx is clear and moist.  Partial deafness. Wears hearing aids.  Eyes:  Conjunctival clouding.  Neck: No JVD present. No tracheal deviation present. No thyromegaly present.  Cardiovascular: Normal rate, regular rhythm, normal heart sounds and intact distal pulses.  Exam reveals no gallop and no friction rub.   No murmur heard. Pulmonary/Chest: Effort normal. No respiratory  distress. He has no wheezes. He has no rales. He exhibits no tenderness.  Abdominal: He exhibits no distension and no mass. There is no tenderness.  Musculoskeletal: Normal range of motion. He exhibits no edema and no tenderness.  Neck pain with flexion, extension,and rotation.  Lymphadenopathy:    He has no cervical adenopathy.  Neurological: He is alert and oriented to person, place, and time. He has normal reflexes. No cranial nerve deficit.  Skin: No rash noted. No erythema. No pallor.  Psychiatric: He has a normal mood and affect. His behavior is normal. Judgment and  thought content normal.          Assessment & Plan:  Unspecified essential hypertension:  Stop HCTZ and reduce enalapril to 20 qd  Other and unspecified hyperlipidemia: recheck next visit  Cervicalgia  Refer to PT

## 2013-04-25 NOTE — Patient Instructions (Signed)
Use medications as directed

## 2013-04-27 DIAGNOSIS — M9981 Other biomechanical lesions of cervical region: Secondary | ICD-10-CM | POA: Diagnosis not present

## 2013-04-27 DIAGNOSIS — M5137 Other intervertebral disc degeneration, lumbosacral region: Secondary | ICD-10-CM | POA: Diagnosis not present

## 2013-04-27 DIAGNOSIS — I10 Essential (primary) hypertension: Secondary | ICD-10-CM | POA: Diagnosis not present

## 2013-04-27 DIAGNOSIS — M999 Biomechanical lesion, unspecified: Secondary | ICD-10-CM | POA: Diagnosis not present

## 2013-04-27 DIAGNOSIS — M5030 Other cervical disc degeneration, unspecified cervical region: Secondary | ICD-10-CM | POA: Diagnosis not present

## 2013-04-27 DIAGNOSIS — M461 Sacroiliitis, not elsewhere classified: Secondary | ICD-10-CM | POA: Diagnosis not present

## 2013-05-01 DIAGNOSIS — R293 Abnormal posture: Secondary | ICD-10-CM | POA: Diagnosis not present

## 2013-05-01 DIAGNOSIS — M542 Cervicalgia: Secondary | ICD-10-CM | POA: Diagnosis not present

## 2013-05-08 DIAGNOSIS — M542 Cervicalgia: Secondary | ICD-10-CM | POA: Diagnosis not present

## 2013-05-08 DIAGNOSIS — R293 Abnormal posture: Secondary | ICD-10-CM | POA: Diagnosis not present

## 2013-05-10 DIAGNOSIS — R293 Abnormal posture: Secondary | ICD-10-CM | POA: Diagnosis not present

## 2013-05-10 DIAGNOSIS — M542 Cervicalgia: Secondary | ICD-10-CM | POA: Diagnosis not present

## 2013-05-12 DIAGNOSIS — M542 Cervicalgia: Secondary | ICD-10-CM | POA: Diagnosis not present

## 2013-05-12 DIAGNOSIS — R293 Abnormal posture: Secondary | ICD-10-CM | POA: Diagnosis not present

## 2013-05-16 DIAGNOSIS — R293 Abnormal posture: Secondary | ICD-10-CM | POA: Diagnosis not present

## 2013-05-16 DIAGNOSIS — M542 Cervicalgia: Secondary | ICD-10-CM | POA: Diagnosis not present

## 2013-05-18 DIAGNOSIS — R293 Abnormal posture: Secondary | ICD-10-CM | POA: Diagnosis not present

## 2013-05-18 DIAGNOSIS — M542 Cervicalgia: Secondary | ICD-10-CM | POA: Diagnosis not present

## 2013-05-23 DIAGNOSIS — R293 Abnormal posture: Secondary | ICD-10-CM | POA: Diagnosis not present

## 2013-05-23 DIAGNOSIS — M542 Cervicalgia: Secondary | ICD-10-CM | POA: Diagnosis not present

## 2013-05-26 DIAGNOSIS — M542 Cervicalgia: Secondary | ICD-10-CM | POA: Diagnosis not present

## 2013-05-26 DIAGNOSIS — R293 Abnormal posture: Secondary | ICD-10-CM | POA: Diagnosis not present

## 2013-05-30 DIAGNOSIS — M542 Cervicalgia: Secondary | ICD-10-CM | POA: Diagnosis not present

## 2013-05-30 DIAGNOSIS — R293 Abnormal posture: Secondary | ICD-10-CM | POA: Diagnosis not present

## 2013-06-01 DIAGNOSIS — M542 Cervicalgia: Secondary | ICD-10-CM | POA: Diagnosis not present

## 2013-06-01 DIAGNOSIS — R293 Abnormal posture: Secondary | ICD-10-CM | POA: Diagnosis not present

## 2013-06-06 DIAGNOSIS — R293 Abnormal posture: Secondary | ICD-10-CM | POA: Diagnosis not present

## 2013-06-06 DIAGNOSIS — M542 Cervicalgia: Secondary | ICD-10-CM | POA: Diagnosis not present

## 2013-06-08 DIAGNOSIS — M542 Cervicalgia: Secondary | ICD-10-CM | POA: Diagnosis not present

## 2013-06-08 DIAGNOSIS — R293 Abnormal posture: Secondary | ICD-10-CM | POA: Diagnosis not present

## 2013-06-12 DIAGNOSIS — M542 Cervicalgia: Secondary | ICD-10-CM | POA: Diagnosis not present

## 2013-06-12 DIAGNOSIS — R293 Abnormal posture: Secondary | ICD-10-CM | POA: Diagnosis not present

## 2013-06-20 DIAGNOSIS — R293 Abnormal posture: Secondary | ICD-10-CM | POA: Diagnosis not present

## 2013-06-20 DIAGNOSIS — M542 Cervicalgia: Secondary | ICD-10-CM | POA: Diagnosis not present

## 2013-06-22 ENCOUNTER — Encounter (HOSPITAL_COMMUNITY): Payer: Self-pay | Admitting: Emergency Medicine

## 2013-06-22 ENCOUNTER — Telehealth: Payer: Self-pay | Admitting: *Deleted

## 2013-06-22 ENCOUNTER — Ambulatory Visit (INDEPENDENT_AMBULATORY_CARE_PROVIDER_SITE_OTHER): Payer: Medicare Other | Admitting: Emergency Medicine

## 2013-06-22 ENCOUNTER — Emergency Department (HOSPITAL_COMMUNITY)
Admission: EM | Admit: 2013-06-22 | Discharge: 2013-06-22 | Discharge status: Against Medical Advice | Payer: Medicare Other | Attending: Emergency Medicine | Admitting: Emergency Medicine

## 2013-06-22 VITALS — BP 118/72 | HR 61 | Temp 97.7°F | Resp 16 | Ht 68.0 in | Wt 157.2 lb

## 2013-06-22 DIAGNOSIS — Z87891 Personal history of nicotine dependence: Secondary | ICD-10-CM | POA: Diagnosis not present

## 2013-06-22 DIAGNOSIS — Z8673 Personal history of transient ischemic attack (TIA), and cerebral infarction without residual deficits: Secondary | ICD-10-CM | POA: Diagnosis not present

## 2013-06-22 DIAGNOSIS — R55 Syncope and collapse: Secondary | ICD-10-CM

## 2013-06-22 DIAGNOSIS — H409 Unspecified glaucoma: Secondary | ICD-10-CM | POA: Diagnosis not present

## 2013-06-22 DIAGNOSIS — I10 Essential (primary) hypertension: Secondary | ICD-10-CM | POA: Insufficient documentation

## 2013-06-22 DIAGNOSIS — Z8669 Personal history of other diseases of the nervous system and sense organs: Secondary | ICD-10-CM | POA: Insufficient documentation

## 2013-06-22 DIAGNOSIS — Z7982 Long term (current) use of aspirin: Secondary | ICD-10-CM | POA: Insufficient documentation

## 2013-06-22 DIAGNOSIS — Z79899 Other long term (current) drug therapy: Secondary | ICD-10-CM | POA: Diagnosis not present

## 2013-06-22 DIAGNOSIS — E785 Hyperlipidemia, unspecified: Secondary | ICD-10-CM | POA: Insufficient documentation

## 2013-06-22 DIAGNOSIS — Z87448 Personal history of other diseases of urinary system: Secondary | ICD-10-CM | POA: Insufficient documentation

## 2013-06-22 DIAGNOSIS — Z872 Personal history of diseases of the skin and subcutaneous tissue: Secondary | ICD-10-CM | POA: Insufficient documentation

## 2013-06-22 DIAGNOSIS — M542 Cervicalgia: Secondary | ICD-10-CM | POA: Diagnosis not present

## 2013-06-22 DIAGNOSIS — R293 Abnormal posture: Secondary | ICD-10-CM | POA: Diagnosis not present

## 2013-06-22 LAB — POCT CBC
Granulocyte percent: 74.1 %G (ref 37–80)
HCT, POC: 45.7 % (ref 43.5–53.7)
MCHC: 32.6 g/dL (ref 31.8–35.4)
MPV: 8.4 fL (ref 0–99.8)
POC Granulocyte: 6.2 (ref 2–6.9)
POC LYMPH PERCENT: 18.5 %L (ref 10–50)
POC MID %: 7.4 %M (ref 0–12)
Platelet Count, POC: 204 10*3/uL (ref 142–424)
RDW, POC: 13.6 %

## 2013-06-22 LAB — CBC WITH DIFFERENTIAL/PLATELET
Basophils Absolute: 0 10*3/uL (ref 0.0–0.1)
Basophils Relative: 0 % (ref 0–1)
Eosinophils Absolute: 0.4 10*3/uL (ref 0.0–0.7)
Eosinophils Relative: 4 % (ref 0–5)
HCT: 41.9 % (ref 39.0–52.0)
Hemoglobin: 15.4 g/dL (ref 13.0–17.0)
Lymphocytes Relative: 21 % (ref 12–46)
Lymphs Abs: 1.9 10*3/uL (ref 0.7–4.0)
MCH: 32.2 pg (ref 26.0–34.0)
MCHC: 36.8 g/dL — ABNORMAL HIGH (ref 30.0–36.0)
MCV: 87.5 fL (ref 78.0–100.0)
Monocytes Absolute: 1.1 10*3/uL — ABNORMAL HIGH (ref 0.1–1.0)
Monocytes Relative: 12 % (ref 3–12)
Neutro Abs: 5.5 10*3/uL (ref 1.7–7.7)
Neutrophils Relative %: 62 % (ref 43–77)
Platelets: 186 10*3/uL (ref 150–400)
RBC: 4.79 MIL/uL (ref 4.22–5.81)
RDW: 12.3 % (ref 11.5–15.5)
WBC: 8.9 10*3/uL (ref 4.0–10.5)

## 2013-06-22 LAB — COMPREHENSIVE METABOLIC PANEL
ALT: 20 U/L (ref 0–53)
AST: 23 U/L (ref 0–37)
AST: 25 U/L (ref 0–37)
Albumin: 3.9 g/dL (ref 3.5–5.2)
Albumin: 3.9 g/dL (ref 3.5–5.2)
Alkaline Phosphatase: 49 U/L (ref 39–117)
Alkaline Phosphatase: 54 U/L (ref 39–117)
BUN: 20 mg/dL (ref 6–23)
CO2: 28 mEq/L (ref 19–32)
Calcium: 9.4 mg/dL (ref 8.4–10.5)
Chloride: 94 mEq/L — ABNORMAL LOW (ref 96–112)
Chloride: 92 mEq/L — ABNORMAL LOW (ref 96–112)
Creatinine, Ser: 0.89 mg/dL (ref 0.50–1.35)
GFR calc Af Amer: 89 mL/min — ABNORMAL LOW (ref >90)
GFR calc non Af Amer: 76 mL/min — ABNORMAL LOW (ref >90)
Glucose, Bld: 114 mg/dL — ABNORMAL HIGH (ref 70–99)
Glucose, Bld: 94 mg/dL (ref 70–99)
Potassium: 3.8 mEq/L (ref 3.5–5.3)
Potassium: 3.7 mEq/L (ref 3.5–5.1)
Sodium: 134 mEq/L — ABNORMAL LOW (ref 135–145)
Sodium: 130 mEq/L — ABNORMAL LOW (ref 135–145)
Total Bilirubin: 0.7 mg/dL (ref 0.3–1.2)
Total Protein: 5.9 g/dL — ABNORMAL LOW (ref 6.0–8.3)
Total Protein: 6.6 g/dL (ref 6.0–8.3)

## 2013-06-22 MED ORDER — SODIUM CHLORIDE 0.9 % IV BOLUS (SEPSIS): 500.0000 mL | INTRAVENOUS | Status: DC

## 2013-06-22 NOTE — Patient Instructions (Signed)
Syncope Syncope Syncope is a fainting spell. This means the person loses consciousness and drops to the ground. The person is generally unconscious for less than 5 minutes. The person may have some muscle twitches for up to 15 seconds before waking up and returning to normal. Syncope occurs more often in elderly people, but it can happen to anyone. While most causes of syncope are not dangerous, syncope can be a sign of a serious medical problem. It is important to seek medical care.  CAUSES  Syncope is caused by a sudden decrease in blood flow to the brain. The specific cause is often not determined. Factors that can trigger syncope include:  Taking medicines that lower blood pressure.  Sudden changes in posture, such as standing up suddenly.  Taking more medicine than prescribed.  Standing in one place for too long.  Seizure disorders.  Dehydration and excessive exposure to heat.  Low blood sugar (hypoglycemia).  Straining to have a bowel movement.  Heart disease, irregular heartbeat, or other circulatory problems.  Fear, emotional distress, seeing blood, or severe pain. SYMPTOMS  Right before fainting, you may:  Feel dizzy or lightheaded.  Feel nauseous.  See all white or all black in your field of vision.  Have cold, clammy skin. DIAGNOSIS  Your caregiver will ask about your symptoms, perform a physical exam, and perform electrocardiography (ECG) to record the electrical activity of your heart. Your caregiver may also perform other heart or blood tests to determine the cause of your syncope. TREATMENT  In most cases, no treatment is needed. Depending on the cause of your syncope, your caregiver may recommend changing or stopping some of your medicines. HOME CARE INSTRUCTIONS  Have someone stay with you until you feel stable.  Do not drive, operate machinery, or play sports until your caregiver says it is okay.  Keep all follow-up appointments as directed by your  caregiver.  Lie down right away if you start feeling like you might faint. Breathe deeply and steadily. Wait until all the symptoms have passed.  Drink enough fluids to keep your urine clear or pale yellow.  If you are taking blood pressure or heart medicine, get up slowly, taking several minutes to sit and then stand. This can reduce dizziness. SEEK IMMEDIATE MEDICAL CARE IF:   You have a severe headache.  You have unusual pain in the chest, abdomen, or back.  You are bleeding from the mouth or rectum, or you have black or tarry stool.  You have an irregular or very fast heartbeat.  You have pain with breathing.  You have repeated fainting or seizure-like jerking during an episode.  You faint when sitting or lying down.  You have confusion.  You have difficulty walking.  You have severe weakness.  You have vision problems. If you fainted, call your local emergency services (911 in [place] Do not drive yourself to the hospital.  MAKE SURE YOU:  Understand these instructions.  Will watch your condition.  Will get help right away if you are not doing well or get worse. Document Released: 07/06/2005 Document Revised: 01/05/2012 Document Reviewed: 09/04/2011 [facility] Patient Information 2014 [place], [place].

## 2013-06-22 NOTE — Telephone Encounter (Signed)
Therese with [facility] called and stated that patient had syncope, cold and clamey, Sweaty and Unresponsive for 5 minutes this morning. She took his BP and it was 84/50, Pulse 43, SPO2: 96. She was calling wanting to know what to do and tell the patient. She wanted me to call the patient back at # [phone]. I called patient and spoke with him and his wife and told them he needed to go ahead and go to Urgent Care Center to be evaluated. Wife stated that he was feeling much better, I told her that we needed to find out why he did what he did and needed to be evaluated. Patient and wife agreed and stated they will go and then follow up with Dr. Chilton Si.

## 2013-06-22 NOTE — ED Provider Notes (Signed)
CSN: [id number]     Arrival date & time 06/22/13  1659 History   First MD Initiated Contact with Patient 06/22/13 1818     Chief Complaint  Patient presents with  . Hypotension    Sent per Urgent Care   (Consider location/radiation/quality/duration/timing/severity/associated sxs/prior Treatment) HPI Patient presents to the emergency department following a syncopal episode that occurred this morning while at physical therapy.  Patient, states, that physical therapist was manipulating his neck, and during that he passed out.  Patient, states she's not had any chest pain, shortness of breath, nausea, vomiting, headache, blurred vision, weakness, numbness, dizziness, fever abdominal pain, back pain, or dysuria.  Patient, states, that he felt fine before and after the episode.  Patient was seen at urgent care and sent to the emergency department for further evaluation.  The urgent care felt he needed more laboratory testing, and they were able to provide. Past Medical History  Diagnosis Date  . Hypertension   . Occlusion and stenosis of carotid artery with cerebral infarction   . Peripheral vascular disease, unspecified   . Other specified cardiac dysrhythmias(427.89)   . Other and unspecified hyperlipidemia   . Endothelial corneal dystrophy   . Rosacea   . Unspecified glaucoma(365.9)   . Pain in joint, pelvic region and thigh   . Urine frequency    Past Surgical History  Procedure Laterality Date  . Eye surgery    . Vasectomy    . Hernia repair  1985    Left  . Hernia repair  03/2004    Right  . 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 Tonsillectomy Bilateral childhood  . Shoulder debridement Left 2005  . Transurethral resection of prostate N/A 10/2003   Family History  Problem Relation Age of Onset  . Cancer Mother   . Heart disease Father    History  Substance Use Topics  . Smoking status: Former Smoker -- 1.00 packs/day for 25 years    Quit date: 10/18/1964  .  Smokeless tobacco: Never Used     Comment: quit 1966  . Alcohol Use: 0.6 oz/week    1 Glasses of wine per week     Comment: glass couple times a week    Review of Systems All other systems negative except as documented in the HPI. All pertinent positives and negatives as reviewed in the HPI.  Allergies  Review of patient's allergies indicates no known allergies.  Home Medications   Current Outpatient Rx  Name  Route  Sig  Dispense  Refill  . aspirin 325 MG tablet   Oral   Take 325 mg by mouth every evening.         . brimonidine-timolol (COMBIGAN) 0.2-0.5 % ophthalmic solution   Both Eyes   Place 1 drop into both eyes every 12 (twelve) hours.         Marland Kitchen doxycycline (VIBRAMYCIN) 100 MG capsule   Oral   Take 100 mg by mouth every Monday, Wednesday, and Friday. One tablet three times a week         . enalapril (VASOTEC) 20 MG tablet   Oral   Take 20 mg by mouth 2 (two) times daily.         . hydrochlorothiazide (HYDRODIURIL) 25 MG tablet   Oral   Take 25 mg by mouth every evening.          . loteprednol (LOTEMAX) 0.5 % ophthalmic suspension   Both Eyes   Place 1  drop into both eyes every morning. In morning         . lovastatin (MEVACOR) 40 MG tablet   Oral   Take 40 mg by mouth daily.          . niacin 500 MG tablet   Oral   Take 500 mg by mouth daily with breakfast.          BP 166/79  Pulse 70  Temp(Src) 97.8 F (36.6 C) (Oral)  Resp 18  SpO2 100% Physical Exam  Nursing note and vitals reviewed. Constitutional: He is oriented to person, place, and time. He appears well-developed and well-nourished.  HENT:  Head: Normocephalic and atraumatic.  Eyes: Conjunctivae are normal. Pupils are unequal.  Patient has had corneal transplant  Neck: Normal range of motion. Neck supple.  Cardiovascular: Normal rate, regular rhythm and normal heart sounds.  Exam reveals no gallop and no friction rub.   No murmur heard. Pulmonary/Chest: Effort normal and  breath sounds normal. No respiratory distress.  Abdominal: Soft. Bowel sounds are normal. He exhibits no distension. There is no tenderness.  Musculoskeletal: He exhibits no edema.  Neurological: He is alert and oriented to person, place, and time. He exhibits normal muscle tone. Coordination normal.  Skin: Skin is warm and dry.    ED Course  Procedures (including critical care time) Labs Review Labs Reviewed  CBC WITH DIFFERENTIAL - Abnormal; Notable for the following:    MCHC 36.8 (*)    Monocytes Absolute 1.1 (*)    All other components within normal limits  COMPREHENSIVE METABOLIC PANEL - Abnormal; Notable for the following:    Sodium 130 (*)    Chloride 92 (*)    GFR calc non Af Amer 76 (*)    GFR calc Af Amer 89 (*)    All other components within normal limits  URINALYSIS, ROUTINE W REFLEX MICROSCOPIC   Patient would like to leave the emergency department without any further evaluation.  Patient, states he feels fine and has felt fine all day.  He feels, like this was related to physical therapy, that he was given this morning.  The patient will followup with his primary care Dr. for recheck.  Patient's vital signs and stable here in the emergency department.  Patient is in no acute distress.  Advised patient to return here at anytime for any worsening in his condition.  He is advised that the risk of leaving include death  Carlyle Dolly, PA-C 06/23/13 0120

## 2013-06-22 NOTE — Progress Notes (Signed)
[facility] [address] [place] [address] [phone]  Date:  06/22/2013   Name:  Russell [name]   DOB:  18-Feb-1927   MRN:  [id number]  PCP:  Kimber Relic, MD    Chief Complaint: Follow-up   History of Present Illness:  Russell [name] is a 79 y.o. very pleasant male patient who presents with the following:  Undergoing PT with neck rotation manipulation this morning and had episode of self-limited unresponsiveness.  He was seen by a nurse shortly after with reported hypotension and bradycardia.  He was noted to be pallid but alert and oriented.  The patient has limited recollection of the episode.  There was no chest pain, shortness of breath, acute neuro symptoms including headache, expressive difficulties, weakness, facial asymmetry; no acute visual complaints.  No nausea or vomiting, no diaphoresis. He has a history of two prior TIA's with normal post event CT's.  Never had (apparently) examination of the carotids.  Currently admits to no distress.   Hypertension and elevated cholesterol.  Non smoker. Does use alcohol. Denies other complaint or health concern today.   Patient Active Problem List   Diagnosis Date Noted  . Cervicalgia 04/25/2013  . Unspecified essential hypertension 10/18/2012  . Other and unspecified hyperlipidemia 10/18/2012  . Transient ischemic attack (TIA), and cerebral infarction without residual deficits 10/18/2012  . 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 Pain in joint, pelvic region and thigh 10/18/2012  . Other specified cardiac dysrhythmias 10/18/2012  . 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 Unspecified glaucoma(365.9) 10/18/2012  . Rosacea 10/18/2012  . Actinic keratosis 10/18/2012    Past Medical History  Diagnosis Date  . Hypertension   . Occlusion and stenosis of carotid artery with cerebral infarction   . Peripheral vascular disease, unspecified    . Other specified cardiac dysrhythmias(427.89)   . Other and unspecified hyperlipidemia   . Endothelial corneal dystrophy   . Rosacea   . Unspecified glaucoma(365.9)   . Pain in joint, pelvic region and thigh   . Urine frequency     Past Surgical History  Procedure Laterality Date  . Eye surgery    . Vasectomy    . Hernia repair  1985    Left  . Hernia repair  03/2004    Right  . 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 Tonsillectomy Bilateral childhood  . Shoulder debridement Left 2005  . Transurethral resection of prostate N/A 10/2003    History  Substance Use Topics  . Smoking status: Former Smoker -- 1.00 packs/day for 25 years    Quit date: 10/18/1964  . Smokeless tobacco: Never Used     Comment: quit 1966  . Alcohol Use: 0.6 oz/week    1 Glasses of wine per week     Comment: glass couple times a week    Family History  Problem Relation Age of Onset  . Cancer Mother   . Heart disease Father     No Known Allergies  Medication list has been reviewed and updated.  Current Outpatient Prescriptions on File Prior to Visit  Medication Sig Dispense Refill  . aspirin 81 MG tablet Take 4 tablets (324 mg total) by mouth daily.  30 tablet  0  . brimonidine-timolol (COMBIGAN) 0.2-0.5 % ophthalmic solution Place 1 drop into both eyes every 12 (twelve) hours.      Marland Kitchen doxycycline (VIBRAMYCIN) 100 MG capsule Take  100 mg by mouth. One tablet three times a week      . enalapril (VASOTEC) 20 MG tablet Take one tablet daily to control blood pressure.  90 tablet  3  . loteprednol (LOTEMAX) 0.5 % ophthalmic suspension Place 1 drop into both eyes. In morning      . lovastatin (MEVACOR) 40 MG tablet Take one tablet once daily for cholesterol.      . niacin 500 MG tablet Take 500 mg by mouth daily with breakfast.       No current facility-administered medications on file prior to visit.    Review of Systems:  As per HPI, otherwise negative.    Physical  Examination: Filed Vitals:   06/22/13 1515  BP: 118/72  Pulse: 61  Temp: 97.7 F (36.5 C)  Resp: 16   Filed Vitals:   06/22/13 1515  Height: 5\' 8"  (1.727 m)  Weight: 157 lb 3.2 oz (71.305 kg)   Body mass index is 23.91 kg/(m^2). Ideal Body Weight: Weight in (lb) to have BMI = 25: 164.1  GEN: WDWN, NAD, Non-toxic, A & O x 3.  CN 2-12 intact.   HEENT: Atraumatic, Normocephalic. Neck supple. No masses, No LAD. Ears and Nose: No external deformity.  Hearing aids  Scant wax CV: RRR, No M/G/R. No JVD. No thrill. No extra heart sounds.  No carotid bruits PULM: CTA B, no wheezes, crackles, rhonchi. No retractions. No resp. distress. No accessory muscle use. ABD: S, NT, ND, +BS. No rebound. No HSM. EXTR: No c/c/e NEURO Normal gait.  Normal motor. PSYCH: Normally interactive. Conversant. Not depressed or anxious appearing.  Calm demeanor.    Assessment and Plan: Syncope:  Carotid sinus stimulation vs TIA vs arrhythmia To ER Refused EMS  Signed,  Phillips Odor, MD   Results for orders placed in visit on 06/22/13  POCT CBC      Result Value Range   WBC 8.3  4.6 - 10.2 K/uL   Lymph, poc 1.5  0.6 - 3.4   POC LYMPH PERCENT 18.5  10 - 50 %L   MID (cbc) 0.6  0 - 0.9   POC MID % 7.4  0 - 12 %M   POC Granulocyte 6.2  2 - 6.9   Granulocyte percent 74.1  37 - 80 %G   RBC 4.71  4.69 - 6.13 M/uL   Hemoglobin 14.9  14.1 - 18.1 g/dL   HCT, POC 16.1  09.6 - 53.7 %   MCV 97.1 (*) 80 - 97 fL   MCH, POC 31.6 (*) 27 - 31.2 pg   MCHC 32.6  31.8 - 35.4 g/dL   RDW, POC 04.5     Platelet Count, POC 204  142 - 424 K/uL   MPV 8.4  0 - 99.8 fL

## 2013-06-22 NOTE — ED Notes (Signed)
Pt states he does not want to stay any long and be evaluated. PA Lawyer aware.

## 2013-06-22 NOTE — ED Notes (Addendum)
Pt alert, arrives from Urgent Care, instructed to come to ED after evaluation, pt states "they could not find anything", denies needs, resp even unlabored, skin pwd

## 2013-06-25 NOTE — ED Provider Notes (Signed)
Medical screening examination/treatment/procedure(s) were performed by non-physician practitioner and as supervising physician I was immediately available for consultation/collaboration.  EKG Interpretation   None        Juliet Rude. Rubin Payor, MD 06/25/13 1043

## 2013-06-27 ENCOUNTER — Encounter: Payer: Self-pay | Admitting: Internal Medicine

## 2013-06-27 ENCOUNTER — Non-Acute Institutional Stay: Payer: Medicare Other | Admitting: Internal Medicine

## 2013-06-27 VITALS — BP 118/60 | HR 52 | Wt 156.0 lb

## 2013-06-27 DIAGNOSIS — M542 Cervicalgia: Secondary | ICD-10-CM | POA: Diagnosis not present

## 2013-06-27 DIAGNOSIS — E871 Hypo-osmolality and hyponatremia: Secondary | ICD-10-CM

## 2013-06-27 DIAGNOSIS — R55 Syncope and collapse: Secondary | ICD-10-CM | POA: Insufficient documentation

## 2013-06-27 DIAGNOSIS — R293 Abnormal posture: Secondary | ICD-10-CM | POA: Diagnosis not present

## 2013-06-27 HISTORY — DX: Syncope and collapse: R55

## 2013-06-27 NOTE — Progress Notes (Signed)
Subjective:    Patient ID: Russell [name], male    DOB: 1927/02/17, 79 y.o.   MRN: [id number]  Chief Complaint  Patient presents with  . Near Syncope    on 06/22/13 durning PT working on neck, had syncopal episode. Went to Urgent Care, then to [facility] ER. Couple of weeks prior to this durning PT almost had syncope episode.     HPI Episode of unresponsiveness, but did not black out, during a manipulation of the neck with PT. Had a similar episode about 2 weeks. He did not remember much about the episode.   Denies difficulty with speech. No change in vision. Sodium was slightly low at 130 in ER.  When he recovered from this episode, the was no detected neurologic issue, arrhythmia, or significant BP change noted.  Current Outpatient Prescriptions on File Prior to Visit  Medication Sig Dispense Refill  . aspirin 325 MG tablet Take 325 mg by mouth every evening.      . brimonidine-timolol (COMBIGAN) 0.2-0.5 % ophthalmic solution Place 1 drop into both eyes every 12 (twelve) hours.      Marland Kitchen doxycycline (VIBRAMYCIN) 100 MG capsule Take 100 mg by mouth every Monday, Wednesday, and Friday. One tablet three times a week      . enalapril (VASOTEC) 20 MG tablet Take 20 mg by mouth 2 (two) times daily.      . hydrochlorothiazide (HYDRODIURIL) 25 MG tablet Take 25 mg by mouth every evening.       . loteprednol (LOTEMAX) 0.5 % ophthalmic suspension Place 1 drop into both eyes every morning. In morning      . lovastatin (MEVACOR) 40 MG tablet Take 40 mg by mouth daily.       . niacin 500 MG tablet Take 500 mg by mouth daily with breakfast.       No current facility-administered medications on file prior to visit.    Review of Systems  Constitutional: Negative for fever, activity change, appetite change and fatigue.  HENT: Positive for hearing loss. Negative for ear pain.   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 Has a visual impairment with a hazy film. Difficult to focus.  Respiratory:  Negative for apnea, cough, choking, chest tightness and wheezing.   Cardiovascular: Negative for chest pain, palpitations and leg swelling.  Gastrointestinal: Negative for nausea, abdominal pain, diarrhea, constipation and abdominal distention.  Endocrine: Negative.   Genitourinary:       Weak stream.  Musculoskeletal:       Neck pains.  Skin:       Has seen Dr. Everlene Farrier for cryotherapy for AK.  Allergic/Immunologic: Negative.   Neurological: Positive for syncope.  Hematological: Negative.   Psychiatric/Behavioral: Negative.        Objective:   Physical Exam  Constitutional: He is oriented to person, place, and time. He appears well-developed and well-nourished. No distress.  HENT:  Head: Normocephalic and atraumatic.  Right Ear: External ear normal.  Left Ear: External ear normal.  Nose: Nose normal.  Mouth/Throat: Oropharynx is clear and moist.  Partial deafness. Wears hearing aids.  Eyes:  Conjunctival clouding.  Neck: No JVD present. No tracheal deviation present. No thyromegaly present.  No carotid bruit.No mass.  Cardiovascular: Normal rate, regular rhythm, normal heart sounds and intact distal pulses.  Exam reveals no gallop and no friction rub.   No murmur heard. Pulmonary/Chest: Effort normal. No respiratory distress. He has no wheezes. He has no rales. He  exhibits no tenderness.  Abdominal: He exhibits no distension and no mass. There is no tenderness.  Musculoskeletal: Normal range of motion. He exhibits no edema and no tenderness.  Neck pain with flexion, extension,and rotation.  Lymphadenopathy:    He has no cervical adenopathy.  Neurological: He is alert and oriented to person, place, and time. He has normal reflexes. No cranial nerve deficit.  Skin: No rash noted. No erythema. No pallor.  Psychiatric: He has a normal mood and affect. His behavior is normal. Judgment and thought content normal.    Admission on 06/22/2013, Discharged on 06/22/2013  Component  Date Value Range Status  . WBC 06/22/2013 8.9  4.0 - 10.5 K/uL Final  . RBC 06/22/2013 4.79  4.22 - 5.81 MIL/uL Final  . Hemoglobin 06/22/2013 15.4  13.0 - 17.0 g/dL Final  . HCT 16/04/9603 41.9  39.0 - 52.0 % Final  . MCV 06/22/2013 87.5  78.0 - 100.0 fL Final  . MCH 06/22/2013 32.2  26.0 - 34.0 pg Final  . MCHC 06/22/2013 36.8* 30.0 - 36.0 g/dL Final  . RDW 54/03/8118 12.3  11.5 - 15.5 % Final  . Platelets 06/22/2013 186  150 - 400 K/uL Final  . Neutrophils Relative % 06/22/2013 62  43 - 77 % Final  . Neutro Abs 06/22/2013 5.5  1.7 - 7.7 K/uL Final  . Lymphocytes Relative 06/22/2013 21  12 - 46 % Final  . Lymphs Abs 06/22/2013 1.9  0.7 - 4.0 K/uL Final  . Monocytes Relative 06/22/2013 12  3 - 12 % Final  . Monocytes Absolute 06/22/2013 1.1* 0.1 - 1.0 K/uL Final  . Eosinophils Relative 06/22/2013 4  0 - 5 % Final  . Eosinophils Absolute 06/22/2013 0.4  0.0 - 0.7 K/uL Final  . Basophils Relative 06/22/2013 0  0 - 1 % Final  . Basophils Absolute 06/22/2013 0.0  0.0 - 0.1 K/uL Final  . Sodium 06/22/2013 130* 135 - 145 mEq/L Final  . Potassium 06/22/2013 3.7  3.5 - 5.1 mEq/L Final  . Chloride 06/22/2013 92* 96 - 112 mEq/L Final  . CO2 06/22/2013 28  19 - 32 mEq/L Final  . Glucose, Bld 06/22/2013 94  70 - 99 mg/dL Final  . BUN 14/78/2956 20  6 - 23 mg/dL Final  . Creatinine, Ser 06/22/2013 0.89  0.50 - 1.35 mg/dL Final  . Calcium 21/30/8657 9.4  8.4 - 10.5 mg/dL Final  . Total Protein 06/22/2013 6.6  6.0 - 8.3 g/dL Final  . Albumin 84/69/6295 3.9  3.5 - 5.2 g/dL Final  . AST 28/41/3244 25  0 - 37 U/L Final  . ALT 06/22/2013 20  0 - 53 U/L Final  . Alkaline Phosphatase 06/22/2013 54  39 - 117 U/L Final  . Total Bilirubin 06/22/2013 0.7  0.3 - 1.2 mg/dL Final  . GFR calc non Af Amer 06/22/2013 76* >90 mL/min Final  . GFR calc Af Amer 06/22/2013 89* >90 mL/min Final   Comment: (NOTE)                          The eGFR has been calculated using the CKD EPI equation.                           This calculation has not been validated in all clinical situations.  eGFR's persistently <90 mL/min signify possible Chronic Kidney                          Disease.  Office Visit on 06/22/2013  Component Date Value Range Status  . WBC 06/22/2013 8.3  4.6 - 10.2 K/uL Final  . Lymph, poc 06/22/2013 1.5  0.6 - 3.4 Final  . POC LYMPH PERCENT 06/22/2013 18.5  10 - 50 %L Final  . MID (cbc) 06/22/2013 0.6  0 - 0.9 Final  . POC MID % 06/22/2013 7.4  0 - 12 %M Final  . POC Granulocyte 06/22/2013 6.2  2 - 6.9 Final  . Granulocyte percent 06/22/2013 74.1  37 - 80 %G Final  . RBC 06/22/2013 4.71  4.69 - 6.13 M/uL Final  . Hemoglobin 06/22/2013 14.9  14.1 - 18.1 g/dL Final  . HCT, POC 16/04/9603 45.7  43.5 - 53.7 % Final  . MCV 06/22/2013 97.1* 80 - 97 fL Final  . MCH, POC 06/22/2013 31.6* 27 - 31.2 pg Final  . MCHC 06/22/2013 32.6  31.8 - 35.4 g/dL Final  . RDW, POC 54/03/8118 13.6   Final  . Platelet Count, POC 06/22/2013 204  142 - 424 K/uL Final  . MPV 06/22/2013 8.4  0 - 99.8 fL Final  . Sodium 06/22/2013 134* 135 - 145 mEq/L Final  . Potassium 06/22/2013 3.8  3.5 - 5.3 mEq/L Final  . Chloride 06/22/2013 94* 96 - 112 mEq/L Final  . CO2 06/22/2013 32  19 - 32 mEq/L Final  . Glucose, Bld 06/22/2013 114* 70 - 99 mg/dL Final  . BUN 14/78/2956 19  6 - 23 mg/dL Final  . Creat 21/30/8657 0.82  0.50 - 1.35 mg/dL Final  . Total Bilirubin 06/22/2013 0.8  0.3 - 1.2 mg/dL Final  . Alkaline Phosphatase 06/22/2013 49  39 - 117 U/L Final  . AST 06/22/2013 23  0 - 37 U/L Final  . ALT 06/22/2013 18  0 - 53 U/L Final  . Total Protein 06/22/2013 5.9* 6.0 - 8.3 g/dL Final  . Albumin 84/69/6295 3.9  3.5 - 5.2 g/dL Final  . Calcium 28/41/3244 9.1  8.4 - 10.5 mg/dL Final         Assessment & Plan:  1. Syncope Stop HCTZ. Do carotid US.  2. Hyponatremia Recheck at next visit

## 2013-06-27 NOTE — Patient Instructions (Signed)
Stop HCTZ due to low BP and low sodium.

## 2013-06-29 ENCOUNTER — Other Ambulatory Visit: Payer: Self-pay

## 2013-06-29 DIAGNOSIS — M542 Cervicalgia: Secondary | ICD-10-CM | POA: Diagnosis not present

## 2013-06-29 DIAGNOSIS — R55 Syncope and collapse: Secondary | ICD-10-CM

## 2013-06-29 DIAGNOSIS — R293 Abnormal posture: Secondary | ICD-10-CM | POA: Diagnosis not present

## 2013-06-30 ENCOUNTER — Telehealth: Payer: Self-pay

## 2013-06-30 ENCOUNTER — Other Ambulatory Visit: Payer: Self-pay | Admitting: Internal Medicine

## 2013-06-30 DIAGNOSIS — R55 Syncope and collapse: Secondary | ICD-10-CM

## 2013-06-30 NOTE — Telephone Encounter (Signed)
Made appt at [facility] 07/03/13 arrive at 12:15 at [address]. Left message on Mr. Wintle recorder, Dr. Chilton Si does want test done before he leaves town,  Left date and time on recorder.

## 2013-07-03 ENCOUNTER — Ambulatory Visit
Admission: RE | Admit: 2013-07-03 | Discharge: 2013-07-03 | Disposition: A | Discharge status: Home / Self Care | Payer: Medicare Other | Source: Ambulatory Visit | Attending: Internal Medicine | Admitting: Internal Medicine

## 2013-07-03 DIAGNOSIS — R293 Abnormal posture: Secondary | ICD-10-CM | POA: Diagnosis not present

## 2013-07-03 DIAGNOSIS — L821 Other seborrheic keratosis: Secondary | ICD-10-CM | POA: Diagnosis not present

## 2013-07-03 DIAGNOSIS — M542 Cervicalgia: Secondary | ICD-10-CM | POA: Diagnosis not present

## 2013-07-03 DIAGNOSIS — R55 Syncope and collapse: Secondary | ICD-10-CM

## 2013-07-24 DIAGNOSIS — M542 Cervicalgia: Secondary | ICD-10-CM | POA: Diagnosis not present

## 2013-07-24 DIAGNOSIS — R293 Abnormal posture: Secondary | ICD-10-CM | POA: Diagnosis not present

## 2013-07-28 DIAGNOSIS — M542 Cervicalgia: Secondary | ICD-10-CM | POA: Diagnosis not present

## 2013-07-28 DIAGNOSIS — R293 Abnormal posture: Secondary | ICD-10-CM | POA: Diagnosis not present

## 2013-07-31 DIAGNOSIS — R293 Abnormal posture: Secondary | ICD-10-CM | POA: Diagnosis not present

## 2013-07-31 DIAGNOSIS — M542 Cervicalgia: Secondary | ICD-10-CM | POA: Diagnosis not present

## 2013-08-02 DIAGNOSIS — R293 Abnormal posture: Secondary | ICD-10-CM | POA: Diagnosis not present

## 2013-08-02 DIAGNOSIS — M542 Cervicalgia: Secondary | ICD-10-CM | POA: Diagnosis not present

## 2013-08-07 DIAGNOSIS — R293 Abnormal posture: Secondary | ICD-10-CM | POA: Diagnosis not present

## 2013-08-07 DIAGNOSIS — M542 Cervicalgia: Secondary | ICD-10-CM | POA: Diagnosis not present

## 2013-08-09 DIAGNOSIS — R293 Abnormal posture: Secondary | ICD-10-CM | POA: Diagnosis not present

## 2013-08-09 DIAGNOSIS — M542 Cervicalgia: Secondary | ICD-10-CM | POA: Diagnosis not present

## 2013-08-14 DIAGNOSIS — R293 Abnormal posture: Secondary | ICD-10-CM | POA: Diagnosis not present

## 2013-08-14 DIAGNOSIS — M542 Cervicalgia: Secondary | ICD-10-CM | POA: Diagnosis not present

## 2013-09-04 DIAGNOSIS — H4011X0 Primary open-angle glaucoma, stage unspecified: Secondary | ICD-10-CM | POA: Diagnosis not present

## 2013-09-04 DIAGNOSIS — H5231 Anisometropia: Secondary | ICD-10-CM | POA: Diagnosis not present

## 2013-09-04 DIAGNOSIS — H52209 Unspecified astigmatism, unspecified eye: Secondary | ICD-10-CM | POA: Diagnosis not present

## 2013-09-04 DIAGNOSIS — Z947 Corneal transplant status: Secondary | ICD-10-CM | POA: Diagnosis not present

## 2013-09-04 DIAGNOSIS — H18519 Endothelial corneal dystrophy, unspecified eye: Secondary | ICD-10-CM | POA: Diagnosis not present

## 2013-09-08 ENCOUNTER — Other Ambulatory Visit: Payer: Self-pay | Admitting: Internal Medicine

## 2013-09-25 ENCOUNTER — Other Ambulatory Visit: Payer: Self-pay | Admitting: Internal Medicine

## 2013-09-25 DIAGNOSIS — Z947 Corneal transplant status: Secondary | ICD-10-CM | POA: Diagnosis not present

## 2013-09-25 DIAGNOSIS — H4011X0 Primary open-angle glaucoma, stage unspecified: Secondary | ICD-10-CM | POA: Diagnosis not present

## 2013-09-25 DIAGNOSIS — H409 Unspecified glaucoma: Secondary | ICD-10-CM | POA: Diagnosis not present

## 2013-10-05 DIAGNOSIS — L570 Actinic keratosis: Secondary | ICD-10-CM | POA: Diagnosis not present

## 2013-10-05 DIAGNOSIS — D485 Neoplasm of uncertain behavior of skin: Secondary | ICD-10-CM | POA: Diagnosis not present

## 2013-10-05 DIAGNOSIS — D239 Other benign neoplasm of skin, unspecified: Secondary | ICD-10-CM | POA: Diagnosis not present

## 2013-10-05 DIAGNOSIS — L719 Rosacea, unspecified: Secondary | ICD-10-CM | POA: Diagnosis not present

## 2013-10-05 DIAGNOSIS — L821 Other seborrheic keratosis: Secondary | ICD-10-CM | POA: Diagnosis not present

## 2013-10-05 DIAGNOSIS — L819 Disorder of pigmentation, unspecified: Secondary | ICD-10-CM | POA: Diagnosis not present

## 2013-10-16 DIAGNOSIS — I10 Essential (primary) hypertension: Secondary | ICD-10-CM | POA: Diagnosis not present

## 2013-10-16 LAB — BASIC METABOLIC PANEL
BUN: 17 mg/dL (ref 4–21)
Creatinine: 0.9 mg/dL (ref 0.6–1.3)
Glucose: 118 mg/dL
Potassium: 4.1 mmol/L (ref 3.4–5.3)
SODIUM: 138 mmol/L (ref 137–147)

## 2013-10-16 LAB — LIPID PANEL
Cholesterol: 138 mg/dL (ref 0–200)
HDL: 41 mg/dL (ref 35–70)
LDL Cholesterol: 83 mg/dL
LDl/HDL Ratio: 3.4
TRIGLYCERIDES: 72 mg/dL (ref 40–160)

## 2013-10-16 LAB — HEPATIC FUNCTION PANEL
ALT: 15 U/L (ref 10–40)
AST: 21 U/L (ref 14–40)
Alkaline Phosphatase: 41 U/L (ref 25–125)
BILIRUBIN, TOTAL: 0.8 mg/dL

## 2013-10-20 DIAGNOSIS — H18519 Endothelial corneal dystrophy, unspecified eye: Secondary | ICD-10-CM | POA: Diagnosis not present

## 2013-10-20 DIAGNOSIS — H2010 Chronic iridocyclitis, unspecified eye: Secondary | ICD-10-CM | POA: Diagnosis not present

## 2013-10-20 DIAGNOSIS — H35359 Cystoid macular degeneration, unspecified eye: Secondary | ICD-10-CM | POA: Diagnosis not present

## 2013-10-24 ENCOUNTER — Encounter: Payer: Self-pay | Admitting: Internal Medicine

## 2013-10-24 ENCOUNTER — Non-Acute Institutional Stay: Payer: Medicare Other | Admitting: Internal Medicine

## 2013-10-24 VITALS — BP 122/60 | HR 60 | Wt 159.0 lb

## 2013-10-24 DIAGNOSIS — L719 Rosacea, unspecified: Secondary | ICD-10-CM

## 2013-10-24 DIAGNOSIS — E871 Hypo-osmolality and hyponatremia: Secondary | ICD-10-CM

## 2013-10-24 DIAGNOSIS — I10 Essential (primary) hypertension: Secondary | ICD-10-CM | POA: Diagnosis not present

## 2013-10-24 DIAGNOSIS — Z8673 Personal history of transient ischemic attack (TIA), and cerebral infarction without residual deficits: Secondary | ICD-10-CM

## 2013-10-24 DIAGNOSIS — E785 Hyperlipidemia, unspecified: Secondary | ICD-10-CM

## 2013-10-24 DIAGNOSIS — M542 Cervicalgia: Secondary | ICD-10-CM

## 2013-10-24 MED ORDER — DOXYCYCLINE HYCLATE 100 MG PO CAPS: 100.0000 mg | ORAL_CAPSULE | ORAL | Status: DC

## 2013-10-24 NOTE — Progress Notes (Signed)
Patient ID: Russell [name], male   DOB: Mar 29, 1927, 80 y.o.   MRN: [id number]    Location:  [facility]  Place of Service: CLINIC    No Known Allergies  Chief Complaint  Patient presents with  . 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 Russell [name] it difficult to turn head to the left.  Hyponatremia: resolved    Medications: Patient's Medications  New Prescriptions   No medications on file  Previous Medications   ASPIRIN 325 MG TABLET    Take 325 mg by mouth every evening.   BRIMONIDINE-TIMOLOL (COMBIGAN) 0.2-0.5 % OPHTHALMIC SOLUTION    Place 1 drop into both eyes every 12 (twelve) hours.   DOXYCYCLINE (VIBRAMYCIN) 100 MG CAPSULE    Take 100 mg by mouth every Monday, Wednesday, and Friday. One tablet three times a week   ENALAPRIL (VASOTEC) 20 MG TABLET    TAKE 1 TABLET TWICE A DAY TO CONTROL BLOOD PRESSURE   LOTEPREDNOL (LOTEMAX) 0.5 % OPHTHALMIC SUSPENSION    Place 1 drop into both eyes every morning. In morning   LOVASTATIN (MEVACOR) 40 MG TABLET    TAKE 1 TABLET BY MOUTH EVERY DAY   MULTIPLE VITAMINS-MINERALS (PRESERVISION AREDS 2 PO)    Take by mouth. Take one tablet twice daily for eyes   NIACIN 500 MG TABLET    Take 500 mg by mouth daily with breakfast.  Modified Medications   No medications on file  Discontinued Medications   No medications on file     Review of Systems  Constitutional: Negative for fever, activity change, appetite change and fatigue.  HENT: Positive for hearing loss. Negative for ear pain.   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 Has a visual impairment with a hazy film. Difficult to focus.  Respiratory:  Negative for apnea, cough, choking, chest tightness and wheezing.   Cardiovascular: Negative for chest pain, palpitations and leg swelling.  Gastrointestinal: Negative for nausea, abdominal pain, diarrhea, constipation and abdominal distention.  Endocrine: Negative.   Genitourinary:       Weak stream.  Musculoskeletal:       Neck pains.  Skin:       Has seen Dr. Larena Sox for cryotherapy for AK.  Allergic/Immunologic: Negative.   Neurological: Positive for syncope.  Hematological: Negative.   Psychiatric/Behavioral: Negative.     Filed Vitals:   10/24/13 0956  BP: 122/60  Pulse: 60  Weight: 159 lb (72.122 kg)   Physical Exam  Constitutional: He is oriented to person, place, and time. He appears well-developed and well-nourished. No distress.  HENT:  Head: Normocephalic and atraumatic.  Right Ear: External ear normal.  Left Ear: External ear normal.  Nose: Nose normal.  Mouth/Throat: Oropharynx is clear and moist.  Partial deafness. Wears hearing aids.  Eyes:  Conjunctival clouding.  Neck: No JVD present. No tracheal deviation present. No thyromegaly present.  No carotid bruit.No mass.  Cardiovascular: Normal rate, regular rhythm, normal heart sounds and intact distal pulses.  Exam reveals no gallop and no friction rub.   No murmur heard. Pulmonary/Chest: Effort normal. No respiratory distress. He has no wheezes. He has no rales. He exhibits no tenderness.  Abdominal: He exhibits no distension and  no mass. There is no tenderness.  Musculoskeletal: Normal range of motion. He exhibits no edema and no tenderness.  Neck pain with flexion, extension,and rotation.  Lymphadenopathy:    He has no cervical adenopathy.  Neurological: He is alert and oriented to person, place, and time. He has normal reflexes. No cranial nerve deficit.  Skin: No rash noted. No erythema. No pallor.  Psychiatric: He has a normal mood and affect. His behavior is normal. Judgment and thought content normal.      Labs reviewed: Nursing Home on 10/24/2013  Component Date Value Ref Range Status  . Glucose 10/16/2013 118   Final  . BUN 10/16/2013 17  4 - 21 mg/dL Final  . Creatinine 10/16/2013 0.9  0.6 - 1.3 mg/dL Final  . Potassium 10/16/2013 4.1  3.4 - 5.3 mmol/L Final  . Sodium 10/16/2013 138  137 - 147 mmol/L Final  . LDl/HDL Ratio 10/16/2013 3.4   Final  . Triglycerides 10/16/2013 72  40 - 160 mg/dL Final  . Cholesterol 10/16/2013 138  0 - 200 mg/dL Final  . HDL 10/16/2013 41  35 - 70 mg/dL Final  . LDL Cholesterol 10/16/2013 83   Final  . Alkaline Phosphatase 10/16/2013 41  25 - 125 U/L Final  . ALT 10/16/2013 15  10 - 40 U/L Final  . AST 10/16/2013 21  14 - 40 U/L Final  . 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 Continue with therapist.  Hyponatremia: resolved

## 2013-10-30 DIAGNOSIS — H409 Unspecified glaucoma: Secondary | ICD-10-CM | POA: Diagnosis not present

## 2013-10-30 DIAGNOSIS — Z947 Corneal transplant status: Secondary | ICD-10-CM | POA: Diagnosis not present

## 2013-10-30 DIAGNOSIS — H4011X0 Primary open-angle glaucoma, stage unspecified: Secondary | ICD-10-CM | POA: Diagnosis not present

## 2013-10-31 DIAGNOSIS — L570 Actinic keratosis: Secondary | ICD-10-CM | POA: Diagnosis not present

## 2013-11-14 ENCOUNTER — Encounter: Payer: Self-pay | Admitting: Internal Medicine

## 2013-11-15 DIAGNOSIS — Z947 Corneal transplant status: Secondary | ICD-10-CM | POA: Diagnosis not present

## 2013-11-16 ENCOUNTER — Encounter: Payer: Self-pay | Admitting: *Deleted

## 2013-12-01 ENCOUNTER — Other Ambulatory Visit: Payer: Self-pay | Admitting: Internal Medicine

## 2013-12-11 ENCOUNTER — Encounter: Payer: Self-pay | Admitting: Internal Medicine

## 2013-12-25 DIAGNOSIS — L570 Actinic keratosis: Secondary | ICD-10-CM | POA: Diagnosis not present

## 2014-04-09 DIAGNOSIS — Z947 Corneal transplant status: Secondary | ICD-10-CM | POA: Diagnosis not present

## 2014-04-09 DIAGNOSIS — H409 Unspecified glaucoma: Secondary | ICD-10-CM | POA: Diagnosis not present

## 2014-04-09 DIAGNOSIS — H4011X0 Primary open-angle glaucoma, stage unspecified: Secondary | ICD-10-CM | POA: Diagnosis not present

## 2014-04-16 DIAGNOSIS — H4011X0 Primary open-angle glaucoma, stage unspecified: Secondary | ICD-10-CM | POA: Diagnosis not present

## 2014-04-16 DIAGNOSIS — Z947 Corneal transplant status: Secondary | ICD-10-CM | POA: Diagnosis not present

## 2014-04-16 DIAGNOSIS — H409 Unspecified glaucoma: Secondary | ICD-10-CM | POA: Diagnosis not present

## 2014-04-24 ENCOUNTER — Encounter: Payer: Self-pay | Admitting: Internal Medicine

## 2014-04-24 ENCOUNTER — Non-Acute Institutional Stay: Payer: Medicare Other | Admitting: Internal Medicine

## 2014-04-24 VITALS — BP 118/70 | HR 64 | Wt 154.0 lb

## 2014-04-24 DIAGNOSIS — E785 Hyperlipidemia, unspecified: Secondary | ICD-10-CM

## 2014-04-24 DIAGNOSIS — I10 Essential (primary) hypertension: Secondary | ICD-10-CM | POA: Diagnosis not present

## 2014-04-24 DIAGNOSIS — M25569 Pain in unspecified knee: Secondary | ICD-10-CM

## 2014-04-24 DIAGNOSIS — M2040 Other hammer toe(s) (acquired), unspecified foot: Secondary | ICD-10-CM | POA: Diagnosis not present

## 2014-04-24 DIAGNOSIS — L821 Other seborrheic keratosis: Secondary | ICD-10-CM

## 2014-04-24 DIAGNOSIS — H409 Unspecified glaucoma: Secondary | ICD-10-CM

## 2014-04-24 DIAGNOSIS — M542 Cervicalgia: Secondary | ICD-10-CM | POA: Diagnosis not present

## 2014-04-24 HISTORY — DX: Other seborrheic keratosis: L82.1

## 2014-04-24 HISTORY — DX: Other hammer toe(s) (acquired), unspecified foot: M20.40

## 2014-04-24 NOTE — Progress Notes (Signed)
Patient ID: Russell [name], male   DOB: October 30, 1926, 80 y.o.   MRN: [id number]    [facility]     Place of Service: Clinic (12)    No Known Allergies  Chief Complaint  Patient presents with  . Medical Management of Chronic Issues    blood pressure  . Neck Pain    and stiffness, been to PT, gotten massages, went to Chiropractor, all suggest he has arithritis in neck  . Knee Pain    left only when he does strecking in morning  . Hammer Toe    has one on right, now starting on left, what can he do about it?  . Sore    behind lef ear, doesn't heal    HPI:  Essential hypertension: Controlled  Cervicalgia: Chronic discomfort in the neck patient has been to physical therapy, massage therapist, and a chiropractor. He has some increase in mobility at night. He presents me with x-rays done at the chiropractor, Dr. Donovan Kail. X-rays show a mild lumbar scoliosis. In the cervical spine area, there are some osteophytes consistent with arthritis in the area.  Hammer toe, unspecified laterality: Bilateral hammertoes on the third of. No ulcerations of the fifth toe. No ulceration at the proximal interphalangeal joint where the hammertoe effect occurs.  Knee pain, unspecified laterality: Left knee medial side when stretching  Glaucoma: Change in eyedrops over the summer. Last fractures reportedly was normal at the ophthalmologist  Hyperlipidemia: Needs followup in the next visit  Seborrheic keratosis: Behind left ear. No bleeding. No pain.    Medications: Patient's Medications  New Prescriptions   No medications on file  Previous Medications   ASPIRIN 325 MG TABLET    Take 325 mg by mouth every evening.   DORZOLAMIDE-TIMOLOL (COSOPT) 22.3-6.8 MG/ML OPHTHALMIC SOLUTION    1 drop. One drop in each eye twice daily for glaucoma   DOXYCYCLINE (VIBRAMYCIN) 100 MG CAPSULE    Take 1 capsule (100 mg total) by mouth every Monday, Wednesday, and Friday. One tablet three times a week for  rosacea.   ENALAPRIL (VASOTEC) 20 MG TABLET    TAKE 1 TABLET TWICE A DAY TO CONTROL BLOOD PRESSURE   LOVASTATIN (MEVACOR) 40 MG TABLET    TAKE 1 TABLET DAILY   MULTIPLE VITAMINS-MINERALS (PRESERVISION AREDS 2 PO)    Take by mouth. Take one tablet twice daily for eyes   NIACIN 500 MG TABLET    Take 500 mg by mouth daily with breakfast.   TRAVOPROST, BAK FREE, (TRAVATAN Z) 0.004 % SOLN OPHTHALMIC SOLUTION    1 drop. One drop left eye once a day for glaucoma  Modified Medications   No medications on file  Discontinued Medications   BRIMONIDINE-TIMOLOL (COMBIGAN) 0.2-0.5 % OPHTHALMIC SOLUTION    Place 1 drop into both eyes every 12 (twelve) hours.   LOTEPREDNOL (LOTEMAX) 0.5 % OPHTHALMIC SUSPENSION    Place 1 drop into both eyes every morning. In morning     Review of Systems  Constitutional: Negative for fever, activity change, appetite change and fatigue.  HENT: Positive for hearing loss. Negative for ear pain.   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 Has a visual impairment with a hazy film. Difficult to focus.  Respiratory: Negative for apnea, cough, choking, chest tightness and wheezing.   Cardiovascular: Negative for chest pain, palpitations and leg swelling.  Gastrointestinal: Negative for nausea, abdominal pain, diarrhea, constipation and abdominal distention.  Endocrine: Negative.  Genitourinary:       Weak stream.  Musculoskeletal:       Neck pains.  Skin:       Has seen Dr. Larena Sox for cryotherapy for AK.  Allergic/Immunologic: Negative.   Neurological: Positive for syncope.  Hematological: Negative.   Psychiatric/Behavioral: Negative.     Filed Vitals:   04/24/14 0959  BP: 118/70  Pulse: 64  Weight: 154 lb (69.854 kg)   Body mass index is 23.42 kg/(m^2).  Physical Exam  Constitutional: He is oriented to person, place, and time. He appears well-developed and well-nourished. No distress.  HENT:  Head: Normocephalic and atraumatic.  Right Ear:  External ear normal.  Left Ear: External ear normal.  Nose: Nose normal.  Mouth/Throat: Oropharynx is clear and moist.  Partial deafness. Wears hearing aids.  Eyes:  Conjunctival clouding.  Neck: No JVD present. No tracheal deviation present. No thyromegaly present.  No carotid bruit.No mass.  Cardiovascular: Normal rate, regular rhythm, normal heart sounds and intact distal pulses.  Exam reveals no gallop and no friction rub.   No murmur heard. Pulmonary/Chest: Effort normal. No respiratory distress. He has no wheezes. He has no rales. He exhibits no tenderness.  Abdominal: He exhibits no distension and no mass. There is no tenderness.  Musculoskeletal: Normal range of motion. He exhibits no edema and no tenderness.  Neck pain with flexion, extension,and rotation. Hammer toe bilaterally at the third toes.  Lymphadenopathy:    He has no cervical adenopathy.  Neurological: He is alert and oriented to person, place, and time. He has normal reflexes. No cranial nerve deficit.  Skin: No rash noted. No erythema. No pallor.  Small scaly seborrheic keratosis behind the left pinna.  Psychiatric: He has a normal mood and affect. His behavior is normal. Judgment and thought content normal.     Labs reviewed: No visits with results within 3 Month(s) from this visit. Latest known visit with results is:  Nursing Home on 10/24/2013  Component Date Value Ref Range Status  . Glucose 10/16/2013 118   Final  . BUN 10/16/2013 17  4 - 21 mg/dL Final  . Creatinine 10/16/2013 0.9  0.6 - 1.3 mg/dL Final  . Potassium 10/16/2013 4.1  3.4 - 5.3 mmol/L Final  . Sodium 10/16/2013 138  137 - 147 mmol/L Final  . LDl/HDL Ratio 10/16/2013 3.4   Final  . Triglycerides 10/16/2013 72  40 - 160 mg/dL Final  . Cholesterol 10/16/2013 138  0 - 200 mg/dL Final  . HDL 10/16/2013 41  35 - 70 mg/dL Final  . LDL Cholesterol 10/16/2013 83   Final  . Alkaline Phosphatase 10/16/2013 41  25 - 125 U/L Final  . ALT  10/16/2013 15  10 - 40 U/L Final  . AST 10/16/2013 21  14 - 40 U/L Final  . Bilirubin, Total 10/16/2013 0.8   Final     Assessment/Plan  1. Essential hypertension Controlled  2. Cervicalgia Chronic discomfort. Patient can "live with" his current discomfort. He has demonstrates that one of the therapeutic modalities recommended that the physical therapy, massage therapy, chiropractic was not helpful to a greater degree.  3. Hammer toe, unspecified laterality Refer to podiatrist or orthopedic foot surgeon when patient desires.  4. Knee pain, unspecified laterality Improved. Focal spot of pain on the medial aspect of the left knee. No crepitus or effusion.  5. Glaucoma Improved on current medications  6. Hyperlipidemia -Lipids  7. Seborrheic keratosis Try castor oil. If lesion fails to respond  he will show it to his dermatologist.

## 2014-05-01 ENCOUNTER — Other Ambulatory Visit: Payer: Self-pay | Admitting: Internal Medicine

## 2014-05-04 DIAGNOSIS — H35351 Cystoid macular degeneration, right eye: Secondary | ICD-10-CM | POA: Diagnosis not present

## 2014-05-04 DIAGNOSIS — H2013 Chronic iridocyclitis, bilateral: Secondary | ICD-10-CM | POA: Diagnosis not present

## 2014-05-04 DIAGNOSIS — H4011X1 Primary open-angle glaucoma, mild stage: Secondary | ICD-10-CM | POA: Diagnosis not present

## 2014-05-05 DIAGNOSIS — Z23 Encounter for immunization: Secondary | ICD-10-CM | POA: Diagnosis not present

## 2014-05-07 DIAGNOSIS — Z947 Corneal transplant status: Secondary | ICD-10-CM | POA: Diagnosis not present

## 2014-05-07 DIAGNOSIS — H4011X1 Primary open-angle glaucoma, mild stage: Secondary | ICD-10-CM | POA: Diagnosis not present

## 2014-05-08 ENCOUNTER — Telehealth: Payer: Self-pay

## 2014-05-08 NOTE — Telephone Encounter (Signed)
Patient came to [facility] clinic, needs referral to [facility] for hearing test. Call [phone], appt Tues Nov 10th at 2:00. Called patient at [phone] left message on recorder, if he can't keep that appt for him to call [facility]. Fax referral [phone].

## 2014-05-09 ENCOUNTER — Other Ambulatory Visit: Payer: Self-pay | Admitting: Internal Medicine

## 2014-06-11 DIAGNOSIS — H903 Sensorineural hearing loss, bilateral: Secondary | ICD-10-CM | POA: Diagnosis not present

## 2014-08-02 ENCOUNTER — Other Ambulatory Visit: Payer: Self-pay | Admitting: Internal Medicine

## 2014-09-26 DIAGNOSIS — H4011X1 Primary open-angle glaucoma, mild stage: Secondary | ICD-10-CM | POA: Diagnosis not present

## 2014-09-26 DIAGNOSIS — Z947 Corneal transplant status: Secondary | ICD-10-CM | POA: Diagnosis not present

## 2014-10-01 DIAGNOSIS — H4011X1 Primary open-angle glaucoma, mild stage: Secondary | ICD-10-CM | POA: Diagnosis not present

## 2014-10-01 DIAGNOSIS — Z947 Corneal transplant status: Secondary | ICD-10-CM | POA: Diagnosis not present

## 2014-10-08 DIAGNOSIS — L719 Rosacea, unspecified: Secondary | ICD-10-CM | POA: Diagnosis not present

## 2014-10-08 DIAGNOSIS — L570 Actinic keratosis: Secondary | ICD-10-CM | POA: Diagnosis not present

## 2014-10-08 DIAGNOSIS — L821 Other seborrheic keratosis: Secondary | ICD-10-CM | POA: Diagnosis not present

## 2014-10-18 ENCOUNTER — Other Ambulatory Visit: Payer: Self-pay | Admitting: Internal Medicine

## 2014-10-22 DIAGNOSIS — I10 Essential (primary) hypertension: Secondary | ICD-10-CM | POA: Diagnosis not present

## 2014-10-22 DIAGNOSIS — E785 Hyperlipidemia, unspecified: Secondary | ICD-10-CM | POA: Diagnosis not present

## 2014-10-22 LAB — LIPID PANEL
Cholesterol: 132 mg/dL (ref 0–200)
HDL: 36 mg/dL (ref 35–70)
LDL Cholesterol: 78 mg/dL
Triglycerides: 90 mg/dL (ref 40–160)

## 2014-10-22 LAB — BASIC METABOLIC PANEL
BUN: 18 mg/dL (ref 4–21)
Creatinine: 0.9 mg/dL (ref 0.6–1.3)
Glucose: 85 mg/dL
Potassium: 4 mmol/L (ref 3.4–5.3)
Sodium: 140 mmol/L (ref 137–147)

## 2014-10-22 LAB — HEPATIC FUNCTION PANEL
ALT: 14 U/L (ref 10–40)
AST: 19 U/L (ref 14–40)
Alkaline Phosphatase: 40 U/L (ref 25–125)
Bilirubin, Total: 0.8 mg/dL

## 2014-10-23 DIAGNOSIS — H35351 Cystoid macular degeneration, right eye: Secondary | ICD-10-CM | POA: Diagnosis not present

## 2014-10-30 ENCOUNTER — Non-Acute Institutional Stay: Payer: Medicare Other | Admitting: Internal Medicine

## 2014-10-30 ENCOUNTER — Encounter: Payer: Self-pay | Admitting: Internal Medicine

## 2014-10-30 VITALS — BP 136/82 | HR 60 | Temp 97.7°F | Ht 69.5 in | Wt 156.0 lb

## 2014-10-30 DIAGNOSIS — H5702 Anisocoria: Secondary | ICD-10-CM | POA: Diagnosis not present

## 2014-10-30 DIAGNOSIS — I10 Essential (primary) hypertension: Secondary | ICD-10-CM

## 2014-10-30 DIAGNOSIS — M542 Cervicalgia: Secondary | ICD-10-CM

## 2014-10-30 DIAGNOSIS — H908 Mixed conductive and sensorineural hearing loss, unspecified: Secondary | ICD-10-CM | POA: Diagnosis not present

## 2014-10-30 DIAGNOSIS — E785 Hyperlipidemia, unspecified: Secondary | ICD-10-CM

## 2014-10-30 HISTORY — DX: Anisocoria: H57.02

## 2014-10-30 NOTE — Progress Notes (Signed)
Patient ID: Russell [name], male   DOB: Jul 12, 1927, 81 y.o.   MRN: [id number]    HISTORY AND PHYSICAL  Location:  [place] of Service: Clinic (12)   Extended Emergency Contact Information Primary Emergency Contact: Optician, dispensing Address: [address] [place], [place] [address] [place] Phone: [phone] Relation: Spouse Secondary Emergency Contact: Plake,Steve  [place] Phone: [phone] Relation: Son  Advanced Directive information Does patient have an advance directive?: Yes, Type of Advance Directive: Healthcare Power of [place];Living will, Does patient want to make changes to advanced directive?: No - Patient declined  Chief Complaint  Patient presents with  . 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 Has also had bilateral cataract surgery with lens implants.    Past Medical History  Diagnosis Date  . Hypertension   . Occlusion and stenosis of carotid artery with cerebral infarction   . Peripheral vascular disease, unspecified   . Other specified cardiac dysrhythmias(427.89)   . Other and unspecified hyperlipidemia   . Endothelial corneal dystrophy   . Rosacea   . Unspecified glaucoma   . Pain in joint, pelvic region and thigh   . Urine frequency     Past Surgical History  Procedure Laterality Date  . Cataract extraction  2005    bilateral  . Vasectomy    . Hernia repair  1985    Left  . Hernia repair  03/2004    Right  . 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 Tonsillectomy Bilateral childhood  . Shoulder debridement Left 2005  . Transurethral resection of prostate N/A 10/2003    Patient Care  Team: Estill Dooms, MD as PCP - General (Internal Medicine) [facility]  History   Social History  . Marital Status: Married    Spouse Name: N/A  . Number of Children: N/A  . Years of Education: N/A   Occupational History  . retired Insurance claims handler    Social History Main Topics  . Smoking status: Former Smoker -- 1.00 packs/day for 25 years    Quit date: 10/18/1964  . Smokeless tobacco: Never Used     Comment: quit 1966  . Alcohol Use: 0.6 oz/week    1 Glasses of wine per week     Comment: glass couple times a week  . Drug Use: No  . Sexual Activity: Not on file   Other Topics Concern  . Not on file   Social History Narrative   [facility]ives at [facility] since 08/21/2011   Married to Floyd Will   Exercise: 3 x week bicycle   Previously smoked stopped 1966   Does not drink cafferinated beverage   Drinks one small glass of wine at night     reports that he quit smoking about 50 years ago. He has never used smokeless tobacco. He reports that he drinks about 0.6 oz of alcohol per week. He reports that he does not use illicit drugs.  Family History  Problem Relation Age of Onset  . Cancer Mother   . Heart disease Father  Family Status  Relation Status Death Age  . Mother Deceased 40    cancer  . Father Deceased 75    heart disease  . Sister Alive   . Son Alive   . Daughter Deceased 62    MVA    Immunization History  Administered Date(s) Administered  . Influenza Whole 04/19/2012, 04/20/2013  . Influenza-Unspecified 05/03/2014  . Pneumococcal Conjugate-13 03/20/2010  . Tdap 06/27/2011    No Known Allergies  Medications: Patient's Medications  New Prescriptions   No medications on file  Previous Medications   ASPIRIN 325 MG TABLET    Take 325 mg by mouth every evening.   DORZOLAMIDE-TIMOLOL (COSOPT) 22.3-6.8 MG/ML OPHTHALMIC SOLUTION    1 drop. One drop in each eye twice daily for glaucoma   DOXYCYCLINE (VIBRAMYCIN) 100 MG  CAPSULE    Take 100 mg by mouth. Take one tablet Monday and Thursday   ENALAPRIL (VASOTEC) 20 MG TABLET    TAKE 1 TABLET TWICE A DAY TO CONTROL BLOOD PRESSURE   LOVASTATIN (MEVACOR) 40 MG TABLET    TAKE 1 TABLET DAILY   MULTIPLE VITAMINS-MINERALS (PRESERVISION AREDS 2 PO)    Take by mouth. Take one tablet twice daily for eyes   NIACIN 500 MG TABLET    Take 500 mg by mouth daily with breakfast.   TRAVOPROST, BAK FREE, (TRAVATAN Z) 0.004 % SOLN OPHTHALMIC SOLUTION    1 drop. One drop left eye once a day for glaucoma  Modified Medications   No medications on file  Discontinued Medications   DOXYCYCLINE (VIBRAMYCIN) 100 MG CAPSULE    Take 1 capsule (100 mg total) by mouth every Monday, Wednesday, and Friday. One tablet three times a week for rosacea.    Review of Systems  Constitutional: Negative for fever, activity change, appetite change and fatigue.  HENT: Positive for hearing loss. Negative for ear pain.   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 Has a visual impairment with a hazy film. Difficult to focus.  Respiratory: Negative for apnea, cough, choking, chest tightness and wheezing.   Cardiovascular: Negative for chest pain, palpitations and leg swelling.  Gastrointestinal: Negative for nausea, abdominal pain, diarrhea, constipation and abdominal distention.  Endocrine: Negative.   Genitourinary:       Weak stream.  Musculoskeletal:       Neck pains.  Skin:       Has seen Dr. Larena Sox for cryotherapy for AK.  Allergic/Immunologic: Negative.   Neurological: Positive for syncope.  Hematological: Negative.   Psychiatric/Behavioral: Negative.     Filed Vitals:   10/30/14 1044  BP: 136/82  Pulse: 60  Temp: 97.7 F (36.5 C)  TempSrc: Oral  Height: 5' 9.5" (1.765 m)  Weight: 156 lb (70.761 kg)  SpO2: 99%   Body mass index is 22.71 kg/(m^2).  Physical Exam  Constitutional: He is oriented to person, place, and time. He appears well-developed and well-nourished.  No distress.  HENT:  Head: Normocephalic and atraumatic.  Right Ear: External ear normal.  Left Ear: External ear normal.  Nose: Nose normal.  Mouth/Throat: Oropharynx is clear and moist.  Partial deafness. Wears hearing aids.  Eyes:  Left pupil larger than the right due to surgery  Neck: No JVD present. No tracheal deviation present. No thyromegaly present.  No carotid bruit.No mass.  Cardiovascular: Normal rate, regular rhythm, normal heart sounds and intact distal pulses.  Exam reveals no gallop and no friction rub.   No murmur  heard. Pulmonary/Chest: Effort normal. No respiratory distress. He has no wheezes. He has no rales. He exhibits no tenderness.  Abdominal: He exhibits no distension and no mass. There is no tenderness.  Genitourinary: Rectum normal, prostate normal and penis normal. Guaiac negative stool.  Left lobe of the prostate is slightly larger than the right lobe.  Musculoskeletal: Normal range of motion. He exhibits no edema or tenderness.  Neck pain with flexion, extension,and rotation. Hammer toe bilaterally at the third toes.  Lymphadenopathy:    He has no cervical adenopathy.  Neurological: He is alert and oriented to person, place, and time. He has normal reflexes. No cranial nerve deficit.  10/30/14 MMSE 30/30. Passed clock drawing.  Skin: No rash noted. No erythema. No pallor.  Small scaly seborrheic keratosis behind the left pinna. Innumerable small SK on the trunk.  Psychiatric: He has a normal mood and affect. His behavior is normal. Judgment and thought content normal.     Labs reviewed: Nursing Home on 10/30/2014  Component Date Value Ref Range Status  . Glucose 10/22/2014 85   Final  . BUN 10/22/2014 18  4 - 21 mg/dL Final  . Creatinine 10/22/2014 0.9  0.6 - 1.3 mg/dL Final  . Potassium 10/22/2014 4.0  3.4 - 5.3 mmol/L Final  . Sodium 10/22/2014 140  137 - 147 mmol/L Final  . Triglycerides 10/22/2014 90  40 - 160 mg/dL Final  . Cholesterol  10/22/2014 132  0 - 200 mg/dL Final  . HDL 10/22/2014 36  35 - 70 mg/dL Final  . LDL Cholesterol 10/22/2014 78   Final  . Alkaline Phosphatase 10/22/2014 40  25 - 125 U/L Final  . ALT 10/22/2014 14  10 - 40 U/L Final  . AST 10/22/2014 19  14 - 40 U/L Final  . Bilirubin, Total 10/22/2014 0.8   Final    Assessment/Plan  1. Essential hypertension Controlled  2. Hyperlipidemia Controlled  3. Cervicalgia Improved   4. Deafness Gradually worsening  5. Anisocoria Unchanged

## 2014-10-30 NOTE — Progress Notes (Signed)
Pass clock drawing 

## 2014-11-14 ENCOUNTER — Encounter: Payer: Self-pay | Admitting: Internal Medicine

## 2014-12-24 DIAGNOSIS — L719 Rosacea, unspecified: Secondary | ICD-10-CM | POA: Diagnosis not present

## 2014-12-24 DIAGNOSIS — L570 Actinic keratosis: Secondary | ICD-10-CM | POA: Diagnosis not present

## 2014-12-28 ENCOUNTER — Other Ambulatory Visit: Payer: Self-pay | Admitting: Internal Medicine

## 2015-04-08 DIAGNOSIS — H52203 Unspecified astigmatism, bilateral: Secondary | ICD-10-CM | POA: Diagnosis not present

## 2015-04-08 DIAGNOSIS — H524 Presbyopia: Secondary | ICD-10-CM | POA: Diagnosis not present

## 2015-04-08 DIAGNOSIS — H4011X1 Primary open-angle glaucoma, mild stage: Secondary | ICD-10-CM | POA: Diagnosis not present

## 2015-04-16 ENCOUNTER — Encounter: Payer: Self-pay | Admitting: Internal Medicine

## 2015-04-18 DIAGNOSIS — Z23 Encounter for immunization: Secondary | ICD-10-CM | POA: Diagnosis not present

## 2015-04-22 DIAGNOSIS — I10 Essential (primary) hypertension: Secondary | ICD-10-CM | POA: Diagnosis not present

## 2015-04-22 LAB — BASIC METABOLIC PANEL
BUN: 16 mg/dL (ref 4–21)
CREATININE: 0.9 mg/dL (ref 0.6–1.3)
Glucose: 100 mg/dL
POTASSIUM: 4 mmol/L (ref 3.4–5.3)
Sodium: 140 mmol/L (ref 137–147)

## 2015-04-23 ENCOUNTER — Encounter: Payer: Self-pay | Admitting: *Deleted

## 2015-04-25 DIAGNOSIS — D0422 Carcinoma in situ of skin of left ear and external auricular canal: Secondary | ICD-10-CM | POA: Diagnosis not present

## 2015-04-25 DIAGNOSIS — L570 Actinic keratosis: Secondary | ICD-10-CM | POA: Diagnosis not present

## 2015-04-25 DIAGNOSIS — D485 Neoplasm of uncertain behavior of skin: Secondary | ICD-10-CM | POA: Diagnosis not present

## 2015-04-30 ENCOUNTER — Non-Acute Institutional Stay: Payer: Medicare Other | Admitting: Internal Medicine

## 2015-04-30 ENCOUNTER — Encounter: Payer: Self-pay | Admitting: Internal Medicine

## 2015-04-30 VITALS — BP 138/82 | HR 61 | Temp 97.4°F | Resp 20 | Ht 70.0 in | Wt 152.0 lb

## 2015-04-30 DIAGNOSIS — M25569 Pain in unspecified knee: Secondary | ICD-10-CM | POA: Diagnosis not present

## 2015-04-30 DIAGNOSIS — I10 Essential (primary) hypertension: Secondary | ICD-10-CM | POA: Diagnosis not present

## 2015-04-30 DIAGNOSIS — R202 Paresthesia of skin: Secondary | ICD-10-CM

## 2015-04-30 DIAGNOSIS — M542 Cervicalgia: Secondary | ICD-10-CM

## 2015-04-30 HISTORY — DX: Paresthesia of skin: R20.2

## 2015-04-30 NOTE — Progress Notes (Signed)
Patient ID: Russell [name], male   DOB: [birth year], 81 y.o.   MRN: [id number]    [facility]     Place of Service: Clinic (12)     No Known Allergies  Chief Complaint  Patient presents with  . Medical Management of Chronic Issues    6 mos f/u, lab review,    HPI:  Loss of sensation in toes of both feet. Des not interfere with walking.  Pain in the right knee. - Chronic and unchanged  Essential hypertension - controlled  Cervicalgia - mild neck discomfort when turning head. No paresthesias in arms or hands.    Medications: Patient's Medications  New Prescriptions   No medications on file  Previous Medications   ASPIRIN 325 MG TABLET    Take 325 mg by mouth every evening.   DORZOLAMIDE-TIMOLOL (COSOPT) 22.3-6.8 MG/ML OPHTHALMIC SOLUTION    1 drop. One drop in each eye twice daily for glaucoma   DOXYCYCLINE (VIBRAMYCIN) 100 MG CAPSULE    Take 100 mg by mouth. Take one tablet Monday and Thursday   ENALAPRIL (VASOTEC) 20 MG TABLET    TAKE 1 TABLET TWICE A DAY TO CONTROL BLOOD PRESSURE   LOVASTATIN (MEVACOR) 40 MG TABLET    TAKE 1 TABLET DAILY   MULTIPLE VITAMINS-MINERALS (PRESERVISION AREDS 2 PO)    Take by mouth. Take one tablet twice daily for eyes   NIACIN 500 MG TABLET    Take 500 mg by mouth daily with breakfast.   TRAVOPROST, BAK FREE, (TRAVATAN Z) 0.004 % SOLN OPHTHALMIC SOLUTION    1 drop. One drop left eye once a day for glaucoma  Modified Medications   No medications on file  Discontinued Medications   No medications on file     Review of Systems  Constitutional: Negative for fever, activity change, appetite change and fatigue.  HENT: Positive for hearing loss. Negative for ear pain.   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 Has a visual impairment with a hazy film. Difficult to focus.  Respiratory: Negative for apnea, cough, choking, chest tightness and wheezing.   Cardiovascular: Negative for chest pain, palpitations and leg  swelling.  Gastrointestinal: Negative for nausea, abdominal pain, diarrhea, constipation and abdominal distention.  Endocrine: Negative.   Genitourinary:       Weak stream.  Musculoskeletal:       Neck pains.  Skin:       Has seen Dr. Larena Sox for cryotherapy for AK.  Allergic/Immunologic: Negative.   Neurological: Positive for syncope.  Hematological: Negative.   Psychiatric/Behavioral: Negative.     Filed Vitals:   04/30/15 0908  BP: 138/82  Pulse: 61  Temp: 97.4 F (36.3 C)  TempSrc: Oral  Resp: 20  Height: '5\' 10"'  (1.778 m)  Weight: 152 lb (68.947 kg)  SpO2: 99%   Body mass index is 21.81 kg/(m^2).  Physical Exam  Constitutional: He is oriented to person, place, and time. He appears well-developed and well-nourished. No distress.  HENT:  Head: Normocephalic and atraumatic.  Right Ear: External ear normal.  Left Ear: External ear normal.  Nose: Nose normal.  Mouth/Throat: Oropharynx is clear and moist.  Partial deafness. Wears hearing aids.  Eyes:  Left pupil larger than the right due to surgery  Neck: No JVD present. No tracheal deviation present. No thyromegaly present.  No carotid bruit.No mass.  Cardiovascular: Normal rate, regular rhythm, normal heart sounds and intact distal pulses.  Exam reveals no gallop  and no friction rub.   No murmur heard. Pulmonary/Chest: Effort normal. No respiratory distress. He has no wheezes. He has no rales. He exhibits no tenderness.  Abdominal: He exhibits no distension and no mass. There is no tenderness.  Genitourinary: Rectum normal, prostate normal and penis normal. Guaiac negative stool.  Left lobe of the prostate is slightly larger than the right lobe.  Musculoskeletal: Normal range of motion. He exhibits no edema or tenderness.  Neck pain with flexion, extension,and rotation. Hammer toe bilaterally at the third toes.  Lymphadenopathy:    He has no cervical adenopathy.  Neurological: He is alert and oriented to person,  place, and time. He has normal reflexes. No cranial nerve deficit.  Numbness in toes of both feet. 10/30/14 MMSE 30/30. Passed clock drawing.  Skin: No rash noted. No erythema. No pallor.  Small scaly seborrheic keratosis behind the left pinna. Innumerable small SK on the trunk.  Psychiatric: He has a normal mood and affect. His behavior is normal. Judgment and thought content normal.     Labs reviewed: Lab Summary Latest Ref Rng 04/22/2015 10/22/2014 10/16/2013 06/22/2013 06/22/2013 06/22/2013  Hemoglobin 13.0 - 17.0 g/dL (None) (None) (None) 15.4 14.9 (None)  Hematocrit 39.0 - 52.0 % (None) (None) (None) 41.9 45.7 (None)  White count 4.0 - 10.5 K/uL (None) (None) (None) 8.9 8.3 (None)  Platelet count 150 - 400 K/uL (None) (None) (None) 186 (None) (None)  Sodium 137 - 147 mmol/L 140 140 138 130(L) (None) 134(L)  Potassium 3.4 - 5.3 mmol/L 4.0 4.0 4.1 3.7 (None) 3.8  Calcium 8.4 - 10.5 mg/dL (None) (None) (None) 9.4 (None) 9.1  Phosphorus - (None) (None) (None) (None) (None) (None)  Creatinine 0.6 - 1.3 mg/dL 0.9 0.9 0.9 0.89 (None) 0.82  AST 14 - 40 U/L (None) '19 21 25 ' (None) 23  Alk Phos 25 - 125 U/L (None) 40 41 54 (None) 49  Bilirubin 0.3 - 1.2 mg/dL (None) (None) (None) 0.7 (None) 0.8  Glucose - 100 85 118 94 (None) 114(H)  Cholesterol 0 - 200 mg/dL (None) 132 138 (None) (None) (None)  HDL cholesterol 35 - 70 mg/dL (None) 36 41 (None) (None) (None)  Triglycerides 40 - 160 mg/dL (None) 90 72 (None) (None) (None)  LDL Direct - (None) (None) (None) (None) (None) (None)  LDL Calc - (None) 78 83 (None) (None) (None)  Total protein 6.0 - 8.3 g/dL (None) (None) (None) 6.6 (None) 5.9(L)  Albumin 3.5 - 5.2 g/dL (None) (None) (None) 3.9 (None) 3.9   No results found for: TSH, T3TOTAL, T4TOTAL, THYROIDAB Lab Results  Component Value Date   BUN 16 04/22/2015   No results found for: HGBA1C     Assessment/Plan 1. Essential hypertension Controlled  2. Knee pain, unspecified  laterality Continue mild analgesics as needed  3. Cervicalgia Continue mild analgesics as needed  4. Paresthesia Patient does not feel additional action needs take place at this time. Test to consider in the future might be PNCV.

## 2015-05-27 DIAGNOSIS — D0430 Carcinoma in situ of skin of unspecified part of face: Secondary | ICD-10-CM | POA: Diagnosis not present

## 2015-09-08 ENCOUNTER — Other Ambulatory Visit: Payer: Self-pay | Admitting: Internal Medicine

## 2015-09-13 DIAGNOSIS — H401131 Primary open-angle glaucoma, bilateral, mild stage: Secondary | ICD-10-CM | POA: Diagnosis not present

## 2015-10-14 DIAGNOSIS — Z947 Corneal transplant status: Secondary | ICD-10-CM | POA: Diagnosis not present

## 2015-10-14 DIAGNOSIS — H401131 Primary open-angle glaucoma, bilateral, mild stage: Secondary | ICD-10-CM | POA: Diagnosis not present

## 2015-10-14 DIAGNOSIS — Z961 Presence of intraocular lens: Secondary | ICD-10-CM | POA: Diagnosis not present

## 2015-10-14 HISTORY — DX: Presence of intraocular lens: Z96.1

## 2015-10-16 DIAGNOSIS — L821 Other seborrheic keratosis: Secondary | ICD-10-CM | POA: Diagnosis not present

## 2015-10-16 DIAGNOSIS — Z872 Personal history of diseases of the skin and subcutaneous tissue: Secondary | ICD-10-CM | POA: Diagnosis not present

## 2015-10-16 DIAGNOSIS — Z23 Encounter for immunization: Secondary | ICD-10-CM | POA: Diagnosis not present

## 2015-10-16 DIAGNOSIS — L719 Rosacea, unspecified: Secondary | ICD-10-CM | POA: Diagnosis not present

## 2015-10-24 DIAGNOSIS — I10 Essential (primary) hypertension: Secondary | ICD-10-CM | POA: Diagnosis not present

## 2015-10-24 LAB — LIPID PANEL
Cholesterol: 124 mg/dL (ref 0–200)
HDL: 39 mg/dL (ref 35–70)
LDL Cholesterol: 74 mg/dL
TRIGLYCERIDES: 55 mg/dL (ref 40–160)

## 2015-10-24 LAB — HEPATIC FUNCTION PANEL
ALK PHOS: 40 U/L (ref 25–125)
ALT: 17 U/L (ref 10–40)
AST: 21 U/L (ref 14–40)
BILIRUBIN, TOTAL: 0.7 mg/dL

## 2015-10-24 LAB — BASIC METABOLIC PANEL
BUN: 17 mg/dL (ref 4–21)
CREATININE: 0.9 mg/dL (ref 0.6–1.3)
Glucose: 75 mg/dL
POTASSIUM: 4 mmol/L (ref 3.4–5.3)
Sodium: 141 mmol/L (ref 137–147)

## 2015-10-25 ENCOUNTER — Encounter: Payer: Self-pay | Admitting: *Deleted

## 2015-10-28 DIAGNOSIS — H401131 Primary open-angle glaucoma, bilateral, mild stage: Secondary | ICD-10-CM | POA: Diagnosis not present

## 2015-10-29 ENCOUNTER — Encounter: Payer: Self-pay | Admitting: Internal Medicine

## 2015-10-29 ENCOUNTER — Non-Acute Institutional Stay: Payer: Medicare Other | Admitting: Internal Medicine

## 2015-10-29 VITALS — BP 124/60 | HR 60 | Temp 97.5°F | Ht 70.0 in | Wt 158.0 lb

## 2015-10-29 DIAGNOSIS — M25519 Pain in unspecified shoulder: Secondary | ICD-10-CM | POA: Insufficient documentation

## 2015-10-29 DIAGNOSIS — M25511 Pain in right shoulder: Secondary | ICD-10-CM

## 2015-10-29 DIAGNOSIS — E785 Hyperlipidemia, unspecified: Secondary | ICD-10-CM

## 2015-10-29 DIAGNOSIS — I10 Essential (primary) hypertension: Secondary | ICD-10-CM

## 2015-10-29 NOTE — Progress Notes (Signed)
Patient ID: Russell [name], male   DOB: Aug 05, 1926, 82 y.o.   MRN: [id number]   Location:  [facility] [phone])  Provider: Jeanmarie Hubert,  Code Status: Full Goals of Care:  Advanced Directives 10/29/2015  Does patient have an advance directive? Yes  Type of Advance Directive [place]  Does patient want to make changes to advanced directive? -  Copy of advanced directive(s) in chart? Yes     Chief Complaint  Patient presents with  . Medical Management of Chronic Issues    blood pressure, cholesterol, labs  . Shoulder Pain    when he raise arm, hurts more with weight lifting    HPI: Patient is a 82 y.o. male seen today for medical management of chronic diseases.    Pain in joint of right shoulder - Exercising more. Right shoulder hurts with certain movements, but is somewhat unpredictable. He is unable to localize the pain by touching throughout the shoulder. He is using weights overhead and also pulled behind his head.  Essential hypertension - controlled  Hyperlipidemia -controlled      Past Medical History  Diagnosis Date  . Hypertension   . Occlusion and stenosis of carotid artery with cerebral infarction   . Peripheral vascular disease, unspecified ([place])   . Other specified cardiac dysrhythmias(427.89)   . Other and unspecified hyperlipidemia   . Endothelial corneal dystrophy   . Rosacea   . Unspecified glaucoma   . Pain in joint, pelvic region and thigh   . Urine frequency     Past Surgical History  Procedure Laterality Date  . Cataract extraction  2005    bilateral  . Vasectomy    . Hernia repair  1985    Left  . Hernia repair  03/2004    Right  . 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 Tonsillectomy Bilateral childhood  . Shoulder debridement Left 2005  . Transurethral resection of prostate N/A 10/2003    No Known Allergies    Medication List       This list is accurate as of: 10/29/15   9:25 AM.  Always use your most recent med list.               aspirin 325 MG tablet  Take 325 mg by mouth every evening.     COSOPT 22.3-6.8 MG/ML ophthalmic solution  Generic drug:  dorzolamide-timolol  1 drop. One drop in each eye twice daily for glaucoma     doxycycline 100 MG capsule  Commonly known as:  VIBRAMYCIN  Take 100 mg by mouth. Take one tablet Monday and Thursday     enalapril 20 MG tablet  Commonly known as:  VASOTEC  TAKE 1 TABLET TWICE A DAY TO CONTROL BLOOD PRESSURE     lovastatin 40 MG tablet  Commonly known as:  MEVACOR  TAKE 1 TABLET DAILY     metroNIDAZOLE 0.75 % gel  Commonly known as:  METROGEL     niacin 500 MG tablet  Take 500 mg by mouth daily with breakfast.     PRESERVISION AREDS 2 PO  Take by mouth. Take one tablet twice daily for eyes     TRAVATAN Z 0.004 % Soln ophthalmic solution  Generic drug:  Travoprost (BAK Free)  1 drop. One drop left eye once a day for glaucoma        Review of Systems:  Review of Systems  Constitutional: Negative  for fever, activity change, appetite change and fatigue.  HENT: Positive for hearing loss. Negative for ear pain.   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 Has a visual impairment with a hazy film. Difficult to focus.  Respiratory: Negative for apnea, cough, choking, chest tightness and wheezing.   Cardiovascular: Negative for chest pain, palpitations and leg swelling.  Gastrointestinal: Negative for nausea, abdominal pain, diarrhea, constipation and abdominal distention.  Endocrine: Negative.   Genitourinary:       Weak stream.  Musculoskeletal:       Neck pains.  Skin:       Has seen Dr. Larena Sox for cryotherapy for AK.  Allergic/Immunologic: Negative.   Neurological: Positive for syncope.  Hematological: Negative.   Psychiatric/Behavioral: Negative.     Health Maintenance  Topic Date Due  . ZOSTAVAX  10/21/1988  . PNA vac Low Risk Adult (2 of 2 - PPSV23) 03/21/2011  .  INFLUENZA VACCINE  02/18/2016  . TETANUS/TDAP  06/26/2021    Physical Exam: Filed Vitals:   10/29/15 0907  BP: 124/60  Pulse: 60  Temp: 97.5 F (36.4 C)  TempSrc: Oral  Height: 5\' 10"  (1.778 m)  Weight: 158 lb (71.668 kg)  SpO2: 96%   Body mass index is 22.67 kg/(m^2). Physical Exam  Constitutional: He is oriented to person, place, and time. He appears well-developed and well-nourished. No distress.  HENT:  Head: Normocephalic and atraumatic.  Right Ear: External ear normal.  Left Ear: External ear normal.  Nose: Nose normal.  Mouth/Throat: Oropharynx is clear and moist.  Partial deafness. Wears hearing aids.  Eyes:  Left pupil larger than the right due to surgery  Neck: No JVD present. No tracheal deviation present. No thyromegaly present.  No carotid bruit.No mass.  Cardiovascular: Normal rate, regular rhythm, normal heart sounds and intact distal pulses.  Exam reveals no gallop and no friction rub.   No murmur heard. Pulmonary/Chest: Effort normal. No respiratory distress. He has no wheezes. He has no rales. He exhibits no tenderness.  Abdominal: He exhibits no distension and no mass. There is no tenderness.  Genitourinary: Rectum normal, prostate normal and penis normal. Guaiac negative stool.  Left lobe of the prostate is slightly larger than the right lobe.  Musculoskeletal: Normal range of motion. He exhibits no edema or tenderness.  Neck pain with flexion, extension,and rotation. Hammer toe bilaterally at the third toes.  Lymphadenopathy:    He has no cervical adenopathy.  Neurological: He is alert and oriented to person, place, and time. He has normal reflexes. No cranial nerve deficit.  Numbness in toes of both feet. 10/30/14 MMSE 30/30. Passed clock drawing.  Skin: No rash noted. No erythema. No pallor.  Small scaly seborrheic keratosis behind the left pinna. Innumerable small SK on the trunk.  Psychiatric: He has a normal mood and affect. His behavior is  normal. Judgment and thought content normal.    Labs reviewed: Basic Metabolic Panel:  Recent Labs  04/22/15 10/24/15  NA 140 141  K 4.0 4.0  BUN 16 17  CREATININE 0.9 0.9   Liver Function Tests:  Recent Labs  10/24/15  AST 21  ALT 17  ALKPHOS 40   No results for input(s): LIPASE, AMYLASE in the last 8760 hours. No results for input(s): AMMONIA in the last 8760 hours. CBC: No results for input(s): WBC, NEUTROABS, HGB, HCT, MCV, PLT in the last 8760 hours. Lipid Panel:  Recent Labs  10/24/15  CHOL  124  HDL 39  LDLCALC 74  TRIG 55   No results found for: HGBA1C  Procedures since last visit: No results found.  Assessment/Plan  1. Pain in joint of right shoulder I advised him not to use weights overhead and behind his head. If pain should worsen, will refer to orthopedist. He does not feel he is ready for this at this time  2. Essential hypertension -CMP, future  3. Hyperlipidemia Lipid panel, future

## 2016-01-13 ENCOUNTER — Other Ambulatory Visit: Payer: Self-pay | Admitting: Internal Medicine

## 2016-04-16 DIAGNOSIS — H35351 Cystoid macular degeneration, right eye: Secondary | ICD-10-CM | POA: Diagnosis not present

## 2016-04-16 DIAGNOSIS — H401131 Primary open-angle glaucoma, bilateral, mild stage: Secondary | ICD-10-CM | POA: Diagnosis not present

## 2016-04-16 DIAGNOSIS — Z961 Presence of intraocular lens: Secondary | ICD-10-CM | POA: Diagnosis not present

## 2016-04-16 DIAGNOSIS — H353132 Nonexudative age-related macular degeneration, bilateral, intermediate dry stage: Secondary | ICD-10-CM | POA: Diagnosis not present

## 2016-04-16 HISTORY — DX: Nonexudative age-related macular degeneration, bilateral, intermediate dry stage: H35.3132

## 2016-04-27 DIAGNOSIS — I10 Essential (primary) hypertension: Secondary | ICD-10-CM | POA: Diagnosis not present

## 2016-04-27 DIAGNOSIS — E785 Hyperlipidemia, unspecified: Secondary | ICD-10-CM | POA: Diagnosis not present

## 2016-04-27 LAB — BASIC METABOLIC PANEL
BUN: 19 mg/dL (ref 4–21)
CREATININE: 0.9 mg/dL (ref 0.6–1.3)
Glucose: 89 mg/dL
Potassium: 4 mmol/L (ref 3.4–5.3)
Sodium: 143 mmol/L (ref 137–147)

## 2016-04-27 LAB — LIPID PANEL
Cholesterol: 126 mg/dL (ref 0–200)
HDL: 37 mg/dL (ref 35–70)
LDL Cholesterol: 75 mg/dL
TRIGLYCERIDES: 69 mg/dL (ref 40–160)

## 2016-04-27 LAB — HEPATIC FUNCTION PANEL
ALT: 14 U/L (ref 10–40)
AST: 19 U/L (ref 14–40)
Alkaline Phosphatase: 45 U/L (ref 25–125)
Bilirubin, Total: 0.7 mg/dL

## 2016-04-28 ENCOUNTER — Encounter: Payer: Self-pay | Admitting: *Deleted

## 2016-04-30 DIAGNOSIS — Z23 Encounter for immunization: Secondary | ICD-10-CM | POA: Diagnosis not present

## 2016-05-08 DIAGNOSIS — L570 Actinic keratosis: Secondary | ICD-10-CM | POA: Diagnosis not present

## 2016-05-08 DIAGNOSIS — L718 Other rosacea: Secondary | ICD-10-CM | POA: Diagnosis not present

## 2016-05-19 ENCOUNTER — Encounter: Payer: Self-pay | Admitting: Internal Medicine

## 2016-05-19 ENCOUNTER — Non-Acute Institutional Stay: Payer: Medicare Other | Admitting: Internal Medicine

## 2016-05-19 VITALS — BP 144/64 | HR 54 | Temp 97.5°F | Ht 70.0 in | Wt 156.0 lb

## 2016-05-19 DIAGNOSIS — G459 Transient cerebral ischemic attack, unspecified: Secondary | ICD-10-CM | POA: Diagnosis not present

## 2016-05-19 DIAGNOSIS — R351 Nocturia: Secondary | ICD-10-CM | POA: Diagnosis not present

## 2016-05-19 DIAGNOSIS — I10 Essential (primary) hypertension: Secondary | ICD-10-CM | POA: Diagnosis not present

## 2016-05-19 DIAGNOSIS — E785 Hyperlipidemia, unspecified: Secondary | ICD-10-CM | POA: Diagnosis not present

## 2016-05-19 HISTORY — DX: Nocturia: R35.1

## 2016-05-19 NOTE — Progress Notes (Signed)
Patient ID: Russell [name], male   DOB: [birth year], 82 y.o.   MRN: [id number]    HISTORY AND PHYSICAL  Location:    [facility]   Place of Service: Clinic (12)   Extended Emergency Contact Information Primary Emergency Contact: Bress,Elizabeth Patt Address: [address] [place], [place] [address] [place] Phone: [phone] Relation: Spouse Secondary Emergency Contact: Mceachern,Steve  [place] Phone: [phone] Relation: Son  Advanced Directive information Does patient have an advance directive?: Yes, Type of Advance Directive: Healthcare Power of Geographical information systems officer Complaint  Patient presents with  . Annual Exam    wellness exam  . Medical Management of Chronic Issues    medication management blood pressure, cholesterol, TIA  . other    how can he clear up "blood pool" under skin both hands  . MMSE    30/30 passed clock drawing    HPI:  Present for annual exam. Worries that his memory is not as good.  Nocturia x 4  Essential hypertension - controlled  Hyperlipidemia, unspecified hyperlipidemia type - controlled. He would like to stop cholesterol meds.  Transient cerebral ischemia, unspecified type - last episode was several years ago.    Past Medical History:  Diagnosis Date  . Actinic keratosis 10/18/2012  . 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   . Atrophy, Fuchs' 12/10/2011  . 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 Cervicalgia 04/25/2013  . 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 Cyclitis, chronic 11/18/2012  . Deafness 10/18/2012  . Endothelial corneal dystrophy   . Essential hypertension 10/18/2012  . Hammer toe 04/24/2014  . Hyperlipidemia 10/18/2012  . Hypertension   . Intermediate stage nonexudative age-related macular degeneration of both eyes 04/16/2016  . Nocturia 05/19/2016  . Occlusion and stenosis of carotid artery with  cerebral infarction   . Other and unspecified hyperlipidemia   . Other specified cardiac dysrhythmias(427.89)   . Pain in joint, pelvic region and thigh   . Paresthesia 04/30/2015   Toes of both feet   . Peripheral vascular disease, unspecified   . Primary open angle glaucoma 09/25/2011  . 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  . Rosacea   . Seborrheic keratosis 04/24/2014  . Syncope 06/27/2013  . TIA (transient ischemic attack) 10/18/2012   States he has had about 5  times  . Unspecified glaucoma(365.9)   . Urine frequency     Past Surgical History:  Procedure Laterality Date  . CATARACT EXTRACTION  2005   bilateral  . CORNEAL TRANSPLANT  2010 left; 2013 right eye  . HERNIA REPAIR  1985   Left  . HERNIA REPAIR  03/2004   Right  . SHOULDER DEBRIDEMENT Left 2005  . TONSILLECTOMY Bilateral childhood  . TRANSURETHRAL RESECTION OF PROSTATE N/A 10/2003  . VASECTOMY      Patient Care Team: Estill Dooms, MD as PCP - General (Internal Medicine) [facility], MD as Consulting Physician (Dermatology) Marilynne Halsted, MD as Referring Physician (Ophthalmology) Rondel Oh, MD as Referring Physician (Ophthalmology)  Social History   Social History  . Marital status: Married    Spouse name: N/A  . Number of children: N/A  . Years of education: N/A   Occupational History  . retired Insurance claims handler    Social History Main Topics  . Smoking status:  Former Smoker    Packs/day: 1.00    Years: 25.00    Quit date: 10/18/1964  . Smokeless tobacco: Never Used     Comment: quit 1966  . Alcohol use 0.6 oz/week    1 Glasses of wine per week     Comment: glass couple times a week  . Drug use: No  . Sexual activity: Not on file   Other Topics Concern  . Not on file   Social History Narrative   [facility] since 08/21/2011   Married to [place] Will   Exercise: 3 x week bicycle   Previously smoked stopped 1966   Does not drink cafferinated  beverage   Drinks one small glass of wine few nights a week     reports that he quit smoking about 51 years ago. He has a 25.00 pack-year smoking history. He has never used smokeless tobacco. He reports that he drinks about 0.6 oz of alcohol per week . He reports that he does not use drugs.  Family History  Problem Relation Age of Onset  . Cancer Mother   . Heart disease Father    Family Status  Relation Status  . Mother Deceased at age 73   cancer  . Father Deceased at age 31   heart disease  . Sister Alive  . Son Alive  . Daughter Deceased at age 68   MVA    Immunization History  Administered Date(s) Administered  . Influenza Whole 04/19/2012, 04/20/2013  . Influenza-Unspecified 05/03/2014, 04/18/2015, 04/30/2016  . Pneumococcal Conjugate-13 03/20/2010  . Tdap 06/27/2011    No Known Allergies  Medications: Patient's Medications  New Prescriptions   No medications on file  Previous Medications   ASPIRIN 325 MG TABLET    Take 325 mg by mouth every evening.   DORZOLAMIDE-TIMOLOL (COSOPT) 22.3-6.8 MG/ML OPHTHALMIC SOLUTION    1 drop. One drop in each eye twice daily for glaucoma   ENALAPRIL (VASOTEC) 20 MG TABLET    TAKE 1 TABLET TWICE A DAY TO CONTROL BLOOD PRESSURE   LOVASTATIN (MEVACOR) 40 MG TABLET    TAKE 1 TABLET DAILY   METRONIDAZOLE (METROGEL) 0.75 % GEL       MULTIPLE VITAMINS-MINERALS (PRESERVISION AREDS 2 PO)    Take by mouth. Take one tablet twice daily for eyes   NIACIN 500 MG TABLET    Take 500 mg by mouth daily with breakfast.   TRAVOPROST, BAK FREE, (TRAVATAN Z) 0.004 % SOLN OPHTHALMIC SOLUTION    1 drop. One drop left eye once a day for glaucoma  Modified Medications   No medications on file  Discontinued Medications   DOXYCYCLINE (VIBRAMYCIN) 100 MG CAPSULE    Take 100 mg by mouth. Take one tablet Monday and Thursday    Review of Systems  Constitutional: Negative for activity change, appetite change, fatigue and fever.  HENT: Positive for hearing  loss. Negative for ear pain.   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 Has a visual impairment with a hazy film. Difficult to focus.  Respiratory: Negative for apnea, cough, choking, chest tightness and wheezing.   Cardiovascular: Negative for chest pain, palpitations and leg swelling.  Gastrointestinal: Negative for abdominal distention, abdominal pain, constipation, diarrhea and nausea.  Endocrine: Negative.   Genitourinary:       Weak stream.  Musculoskeletal:       Neck pains.  Skin:  Has seen Dr. Larena Sox for cryotherapy for AK.  Allergic/Immunologic: Negative.   Neurological: Positive for syncope.  Hematological: Negative.   Psychiatric/Behavioral: Negative.     Vitals:   05/19/16 0917  BP: (!) 144/64  Pulse: (!) 54  Temp: 97.5 F (36.4 C)  TempSrc: Oral  SpO2: 98%  Weight: 156 lb (70.8 kg)  Height: '5\' 10"'  (1.778 m)   Body mass index is 22.38 kg/m. Filed Weights   05/19/16 0917  Weight: 156 lb (70.8 kg)     Physical Exam  Constitutional: He is oriented to person, place, and time. He appears well-developed and well-nourished. No distress.  HENT:  Head: Normocephalic and atraumatic.  Right Ear: External ear normal.  Left Ear: External ear normal.  Nose: Nose normal.  Mouth/Throat: Oropharynx is clear and moist.  Partial deafness. Wears hearing aids.  Eyes:  Left pupil larger than the right due to surgery  Neck: No JVD present. No tracheal deviation present. No thyromegaly present.  No carotid bruit.No mass.  Cardiovascular: Normal rate, regular rhythm, normal heart sounds and intact distal pulses.  Exam reveals no gallop and no friction rub.   No murmur heard. Pulmonary/Chest: Effort normal. No respiratory distress. He has no wheezes. He has no rales. He exhibits no tenderness.  Abdominal: He exhibits no distension and no mass. There is no tenderness.  Genitourinary: Rectum normal, prostate normal and penis normal. Rectal exam shows guaiac  negative stool.  Genitourinary Comments: Left lobe of the prostate is slightly larger than the right lobe.  Musculoskeletal: Normal range of motion. He exhibits no edema or tenderness.  Neck pain with flexion, extension,and rotation. Hammer toe bilaterally at the third toes.  Lymphadenopathy:    He has no cervical adenopathy.  Neurological: He is alert and oriented to person, place, and time. He has normal reflexes. No cranial nerve deficit.  Numbness in toes of both feet. 10/30/14 MMSE 30/30. Passed clock drawing.  Skin: No rash noted. No erythema. No pallor.  Small scaly seborrheic keratosis behind the left pinna. Innumerable small SK on the trunk. Ecchymosis on the dorsum of the right hand.  Psychiatric: He has a normal mood and affect. His behavior is normal. Judgment and thought content normal.    Labs reviewed: Lab Summary Latest Ref Rng & Units 04/27/2016 10/24/2015 04/22/2015 10/22/2014  Hemoglobin 13.0-17.0 g/dL (None) (None) (None) (None)  Hematocrit 39.0-52.0 % (None) (None) (None) (None)  White count - (None) (None) (None) (None)  Platelet count - (None) (None) (None) (None)  Sodium 137 - 147 mmol/L 143 141 140 140  Potassium 3.4 - 5.3 mmol/L 4.0 4.0 4.0 4.0  Calcium - (None) (None) (None) (None)  Phosphorus - (None) (None) (None) (None)  Creatinine 0.6 - 1.3 mg/dL 0.9 0.9 0.9 0.9  AST 14 - 40 U/L 19 21 (None) 19  Alk Phos 25 - 125 U/L 45 40 (None) 40  Bilirubin - (None) (None) (None) (None)  Glucose mg/dL 89 75 100 85  Cholesterol 0 - 200 mg/dL 126 124 (None) 132  HDL cholesterol 35 - 70 mg/dL 37 39 (None) 36  Triglycerides 40 - 160 mg/dL 69 55 (None) 90  LDL Direct - (None) (None) (None) (None)  LDL Calc mg/dL 75 74 (None) 78  Total protein - (None) (None) (None) (None)  Albumin - (None) (None) (None) (None)  Some recent data might be hidden   Lab Results  Component Value Date   BUN 19 04/27/2016    Assessment/Plan 1. Essential hypertension continue  enalapril  2. Hyperlipidemia, unspecified hyperlipidemia type -stop niacin and lovastatin -lipid panel, future  3. Transient cerebral ischemia, unspecified type Continue ASA  4. Nocturia monitor

## 2016-05-25 ENCOUNTER — Encounter: Payer: Self-pay | Admitting: Internal Medicine

## 2016-07-08 DIAGNOSIS — H401131 Primary open-angle glaucoma, bilateral, mild stage: Secondary | ICD-10-CM | POA: Diagnosis not present

## 2016-08-03 ENCOUNTER — Telehealth: Payer: Self-pay | Admitting: *Deleted

## 2016-08-03 NOTE — Telephone Encounter (Signed)
Patient called and just wanted to inform you that he had a TIA today. Previous one was 06/14/16. Today lasted 5 minutes. Transparent dancing pattern, slightly left of Center in vision. No loss of verbal clarity. Wonders if he should go back on the BP reducing medications you took him off of.  Next appointment 11/17/16. Please Advise.

## 2016-08-03 NOTE — Telephone Encounter (Signed)
Patient notified and agreed. Patient scheduled an appointment here at our office instead of waiting for one at [facility].

## 2016-08-03 NOTE — Telephone Encounter (Signed)
Needs 1st available appt.  If BP is <140/<90, then he does not need to resume BP meds yet. If either the SBP or the DBP is high, then resume the BP meds. Nurse at [facility] can assist in getting BP if he does not have a BP cuff.

## 2016-08-05 ENCOUNTER — Ambulatory Visit: Payer: Self-pay | Admitting: Internal Medicine

## 2016-08-12 ENCOUNTER — Ambulatory Visit: Payer: Self-pay | Admitting: Internal Medicine

## 2016-09-22 ENCOUNTER — Other Ambulatory Visit: Payer: Self-pay | Admitting: *Deleted

## 2016-09-22 MED ORDER — ENALAPRIL MALEATE 20 MG PO TABS: ORAL_TABLET | ORAL | 3 refills | Status: DC

## 2016-09-22 NOTE — Telephone Encounter (Signed)
[employer] [facility]

## 2016-09-29 ENCOUNTER — Non-Acute Institutional Stay: Payer: Medicare Other | Admitting: Internal Medicine

## 2016-09-29 ENCOUNTER — Encounter: Payer: Self-pay | Admitting: Internal Medicine

## 2016-09-29 VITALS — BP 124/60 | HR 54 | Temp 97.6°F | Ht 70.0 in | Wt 154.0 lb

## 2016-09-29 DIAGNOSIS — I10 Essential (primary) hypertension: Secondary | ICD-10-CM

## 2016-09-29 DIAGNOSIS — E785 Hyperlipidemia, unspecified: Secondary | ICD-10-CM

## 2016-09-29 DIAGNOSIS — K5904 Chronic idiopathic constipation: Secondary | ICD-10-CM | POA: Diagnosis not present

## 2016-09-29 DIAGNOSIS — K5900 Constipation, unspecified: Secondary | ICD-10-CM | POA: Insufficient documentation

## 2016-09-29 NOTE — Progress Notes (Signed)
[place] of Service: Clinic (12)     No Known Allergies  Chief Complaint  Patient presents with  . Medical Management of Chronic Issues    trouble with bowels for a while with a little constipation, take senna, then get a little loose.     HPI:  Chronic idiopathic constipation - farting more. Constipated despite prunes, fiber in cerea , and senna. Knows he needs too be more attentive to fluid intake. Stressed by his wife's continuing ill health. Has not tried probiotic. Had colonoscopy less than 10 years ago. He does not know of any problems that were found. No blood in the stool, or on toilet paper.  Essential hypertension - controled  Hyperlipidemia, unspecified hyperlipidemia type - needs lab in the future.    Medications: Patient's Medications  New Prescriptions   No medications on file  Previous Medications   ASPIRIN 325 MG TABLET    Take 325 mg by mouth every evening.   DORZOLAMIDE-TIMOLOL (COSOPT) 22.3-6.8 MG/ML OPHTHALMIC SOLUTION    1 drop. One drop in each eye twice daily for glaucoma   ENALAPRIL (VASOTEC) 20 MG TABLET    Take one tablet by mouth twice daily to control blood pressure   METRONIDAZOLE (METROGEL) 0.75 % GEL    Apply topically. Apply twice daily   MULTIPLE VITAMINS-MINERALS (PRESERVISION AREDS 2 PO)    Take by mouth. Take one tablet twice daily for eyes   TRAVOPROST, BAK FREE, (TRAVATAN Z) 0.004 % SOLN OPHTHALMIC SOLUTION    1 drop. One drop left eye once a day for glaucoma  Modified Medications   No medications on file  Discontinued Medications   No medications on file     Review of Systems  Constitutional: Negative for activity change, appetite change, fatigue and fever.  HENT: Positive for hearing loss. Negative for ear pain.   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 Has a visual impairment with a hazy film. Difficult to focus.  Respiratory: Negative for apnea, cough, choking, chest tightness and wheezing.     Cardiovascular: Negative for chest pain, palpitations and leg swelling.  Gastrointestinal: Positive for constipation. Negative for abdominal distention, abdominal pain, diarrhea and nausea.  Endocrine: Negative.   Genitourinary:       Weak stream.  Musculoskeletal:       Neck pains.  Skin:       Has seen Dr. Larena Sox for cryotherapy for AK.  Allergic/Immunologic: Negative.   Neurological: Positive for syncope.  Hematological: Negative.   Psychiatric/Behavioral: Negative.     Vitals:   09/29/16 1120  BP: 124/60  Pulse: (!) 54  Temp: 97.6 F (36.4 C)  TempSrc: Oral  SpO2: 97%  Weight: 154 lb (69.9 kg)  Height: '5\' 10"'  (1.778 m)   Wt Readings from Last 3 Encounters:  09/29/16 154 lb (69.9 kg)  05/19/16 156 lb (70.8 kg)  10/29/15 158 lb (71.7 kg)    Body mass index is 22.1 kg/m.    Physical Exam  Constitutional: He is oriented to person, place, and time. He appears well-developed and well-nourished. No distress.  HENT:  Head: Normocephalic and atraumatic.  Right Ear: External ear normal.  Left Ear: External ear normal.  Nose: Nose normal.  Mouth/Throat: Oropharynx is clear and moist.  Partial deafness. Wears hearing aids.  Eyes:  Left pupil larger than the right due to surgery  Neck: No JVD present. No tracheal deviation present. No thyromegaly  present.  No carotid bruit.No mass.  Cardiovascular: Normal rate, regular rhythm, normal heart sounds and intact distal pulses.  Exam reveals no gallop and no friction rub.   No murmur heard. Pulmonary/Chest: Effort normal. No respiratory distress. He has no wheezes. He has no rales. He exhibits no tenderness.  Abdominal: He exhibits no distension and no mass. There is no tenderness.  Genitourinary: Rectum normal, prostate normal and penis normal. Rectal exam shows guaiac negative stool.  Genitourinary Comments: Left lobe of the prostate is slightly larger than the right lobe.  Musculoskeletal: Normal range of motion. He  exhibits no edema or tenderness.  Neck pain with flexion, extension,and rotation. Hammer toe bilaterally at the third toes.  Lymphadenopathy:    He has no cervical adenopathy.  Neurological: He is alert and oriented to person, place, and time. He has normal reflexes. No cranial nerve deficit.  Numbness in toes of both feet. 10/30/14 MMSE 30/30. Passed clock drawing.  Skin: No rash noted. No erythema. No pallor.  Small scaly seborrheic keratosis behind the left pinna. Innumerable small SK on the trunk. Ecchymosis on the dorsum of the right hand.  Psychiatric: He has a normal mood and affect. His behavior is normal. Judgment and thought content normal.     Labs reviewed: Lab Summary Latest Ref Rng & Units 04/27/2016 10/24/2015 04/22/2015 10/22/2014  Hemoglobin 13.0-17.0 g/dL (None) (None) (None) (None)  Hematocrit 39.0-52.0 % (None) (None) (None) (None)  White count - (None) (None) (None) (None)  Platelet count - (None) (None) (None) (None)  Sodium 137 - 147 mmol/L 143 141 140 140  Potassium 3.4 - 5.3 mmol/L 4.0 4.0 4.0 4.0  Calcium - (None) (None) (None) (None)  Phosphorus - (None) (None) (None) (None)  Creatinine 0.6 - 1.3 mg/dL 0.9 0.9 0.9 0.9  AST 14 - 40 U/L 19 21 (None) 19  Alk Phos 25 - 125 U/L 45 40 (None) 40  Bilirubin - (None) (None) (None) (None)  Glucose mg/dL 89 75 100 85  Cholesterol 0 - 200 mg/dL 126 124 (None) 132  HDL cholesterol 35 - 70 mg/dL 37 39 (None) 36  Triglycerides 40 - 160 mg/dL 69 55 (None) 90  LDL Direct - (None) (None) (None) (None)  LDL Calc mg/dL 75 74 (None) 78  Total protein - (None) (None) (None) (None)  Albumin - (None) (None) (None) (None)  Some recent data might be hidden   No results found for: TSH Lab Results  Component Value Date   BUN 19 04/27/2016   BUN 17 10/24/2015   BUN 16 04/22/2015   Lab Results  Component Value Date   CREATININE 0.9 04/27/2016   CREATININE 0.9 10/24/2015   CREATININE 0.9 04/22/2015   No results found for:  HGBA1C     Assessment/Plan  1. Chronic idiopathic constipation *increase fluid to 1.5 quarts qd *Miralax *fiber from cereal or bran muffin, fiber bars, or fibeer wafers, etc. *senna prn  2. Essential hypertension The current medical regimen is effective;  continue present plan and medications.  3. Hyperlipidemia, unspecified hyperlipidemia type -lipid panel, future

## 2016-10-18 ENCOUNTER — Encounter: Payer: Self-pay | Admitting: Internal Medicine

## 2016-10-19 ENCOUNTER — Other Ambulatory Visit: Payer: Self-pay | Admitting: Internal Medicine

## 2016-11-06 ENCOUNTER — Other Ambulatory Visit: Payer: Self-pay

## 2016-11-06 DIAGNOSIS — E785 Hyperlipidemia, unspecified: Secondary | ICD-10-CM

## 2016-11-09 ENCOUNTER — Other Ambulatory Visit: Payer: Medicare Other

## 2016-11-17 ENCOUNTER — Encounter: Payer: Self-pay | Admitting: Internal Medicine

## 2016-11-19 ENCOUNTER — Other Ambulatory Visit: Payer: Self-pay

## 2016-11-19 ENCOUNTER — Other Ambulatory Visit: Payer: Self-pay | Admitting: Internal Medicine

## 2016-11-19 ENCOUNTER — Telehealth: Payer: Self-pay | Admitting: Internal Medicine

## 2016-11-19 DIAGNOSIS — E785 Hyperlipidemia, unspecified: Secondary | ICD-10-CM

## 2016-11-19 NOTE — Telephone Encounter (Signed)
Called [name], Appt scheduled for office visit and labs was cancelled.  Called pt to reschedule.  Left message.cdavis

## 2016-11-23 ENCOUNTER — Other Ambulatory Visit: Payer: Medicare Other

## 2016-11-24 ENCOUNTER — Encounter: Payer: Self-pay | Admitting: Internal Medicine

## 2016-11-24 DIAGNOSIS — L570 Actinic keratosis: Secondary | ICD-10-CM | POA: Diagnosis not present

## 2016-11-24 DIAGNOSIS — L821 Other seborrheic keratosis: Secondary | ICD-10-CM | POA: Diagnosis not present

## 2016-11-24 DIAGNOSIS — D225 Melanocytic nevi of trunk: Secondary | ICD-10-CM | POA: Diagnosis not present

## 2016-11-24 DIAGNOSIS — L814 Other melanin hyperpigmentation: Secondary | ICD-10-CM | POA: Diagnosis not present

## 2016-11-24 DIAGNOSIS — D1801 Hemangioma of skin and subcutaneous tissue: Secondary | ICD-10-CM | POA: Diagnosis not present

## 2016-11-24 DIAGNOSIS — D2261 Melanocytic nevi of right upper limb, including shoulder: Secondary | ICD-10-CM | POA: Diagnosis not present

## 2016-11-24 DIAGNOSIS — D2262 Melanocytic nevi of left upper limb, including shoulder: Secondary | ICD-10-CM | POA: Diagnosis not present

## 2016-11-24 DIAGNOSIS — D692 Other nonthrombocytopenic purpura: Secondary | ICD-10-CM | POA: Diagnosis not present

## 2016-11-27 ENCOUNTER — Telehealth: Payer: Self-pay

## 2016-11-27 NOTE — Telephone Encounter (Signed)
Received dermatology report from [facility] diagnostics results placed in Jessica's review and sign due to Dr. Nyoka Cowden absence.

## 2016-12-14 DIAGNOSIS — R071 Chest pain on breathing: Secondary | ICD-10-CM | POA: Diagnosis not present

## 2016-12-14 DIAGNOSIS — J90 Pleural effusion, not elsewhere classified: Secondary | ICD-10-CM | POA: Diagnosis not present

## 2016-12-14 DIAGNOSIS — R918 Other nonspecific abnormal finding of lung field: Secondary | ICD-10-CM | POA: Diagnosis not present

## 2016-12-14 DIAGNOSIS — R0789 Other chest pain: Secondary | ICD-10-CM | POA: Diagnosis not present

## 2016-12-14 DIAGNOSIS — I700 Atherosclerosis of aorta: Secondary | ICD-10-CM | POA: Diagnosis not present

## 2016-12-22 ENCOUNTER — Other Ambulatory Visit: Payer: Medicare Other

## 2016-12-22 DIAGNOSIS — E785 Hyperlipidemia, unspecified: Secondary | ICD-10-CM

## 2016-12-23 LAB — LIPID PANEL
CHOLESTEROL: 187 mg/dL (ref <200)
HDL: 45 mg/dL (ref >40)
LDL CALC: 126 mg/dL — AB (ref <100)
Total CHOL/HDL Ratio: 4.2 Ratio (ref <5.0)
Triglycerides: 78 mg/dL (ref <150)
VLDL: 16 mg/dL (ref <30)

## 2016-12-24 ENCOUNTER — Encounter: Payer: Self-pay | Admitting: *Deleted

## 2016-12-25 ENCOUNTER — Encounter: Payer: Self-pay | Admitting: Internal Medicine

## 2016-12-25 ENCOUNTER — Ambulatory Visit (INDEPENDENT_AMBULATORY_CARE_PROVIDER_SITE_OTHER): Payer: Medicare Other | Admitting: Internal Medicine

## 2016-12-25 VITALS — BP 142/70 | HR 51 | Temp 98.5°F | Wt 151.0 lb

## 2016-12-25 DIAGNOSIS — G459 Transient cerebral ischemic attack, unspecified: Secondary | ICD-10-CM

## 2016-12-25 DIAGNOSIS — Z7189 Other specified counseling: Secondary | ICD-10-CM

## 2016-12-25 DIAGNOSIS — M2040 Other hammer toe(s) (acquired), unspecified foot: Secondary | ICD-10-CM

## 2016-12-25 DIAGNOSIS — S6992XA Unspecified injury of left wrist, hand and finger(s), initial encounter: Secondary | ICD-10-CM | POA: Diagnosis not present

## 2016-12-25 DIAGNOSIS — J90 Pleural effusion, not elsewhere classified: Secondary | ICD-10-CM | POA: Diagnosis not present

## 2016-12-25 DIAGNOSIS — E7800 Pure hypercholesterolemia, unspecified: Secondary | ICD-10-CM

## 2016-12-25 MED ORDER — LOVASTATIN 20 MG PO TABS: 20.0000 mg | ORAL_TABLET | Freq: Every day | ORAL | 3 refills | Status: DC

## 2016-12-25 NOTE — Progress Notes (Signed)
Location:  [facility] clinic Provider:  Daelon Dunivan L. Mariea Clonts, D.O., C.M.D.  Code status:  DNR ACP discussion held today re: code status--pt is clear that if his heart stopped or he stopped breathing, he would not want CPR done.  He is concerned that his quality of life would be poor after that.    Goals of Care:  Advanced Directives 12/25/2016  Does Patient Have a Medical Advance Directive? Yes  Type of Advance Directive [place]  Does patient want to make changes to medical advance directive? -  Copy of [place] in Chart? -   Chief Complaint  Patient presents with  . Medical Management of Chronic Issues    14mth follow-up    HPI: Patient is a 83 y.o. male seen today for medical management of chronic diseases.  He is planning to follow with Dr. Bubba Camp at [facility] after following with Dr. Nyoka Cowden for many years.  Reports doing generally well.    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  Divorced and remarried.  His wife has neuroendocrine cancer and he's stressed.  FDA took a long time approving her medication, then not covered by medicare, put on kaytruda instead, got colitis, now on prednisone, but now has adrenal insufficiency. They went to a [place] clinic (holistic) at her family's urging it sounds.  Only had minimal wifi and food was questionable. He got dehydrated, tired and fell.  Sprained his left thumb.  Was bleeding and still has something possibly under the skin--he asks if this should be removed--we discussed since it's healed over to leave it alone now.    Since his last visit here, he saw Dr. Tammi Klippel at [facility] urgent care about acute chest wall pain on 12/14/16. He was found to have a pleural effusion.  He was to have a f/u CT chest w/o contrast.  Months and months ago, same chest pain lower left lung.  He saw the nurse at the time at [facility].  It went away and  came back.  Pain went away again this time.  Could take a deep breath and bring it on initially.  No shortness of breath.  When they went to [place], he lost 4-5 lbs with vegan diet.  He says he's trying to keep it off b/c it may have been fat anyway. He did smoke for 20 years and stopped at 83 years old.    He had a TIA.  He's been having them off and on for a couple of years now.  A couple of weeks ago, he wrote it down.  It was the usual--one of his eyes starts to show a messed up checkerboard that moves around.  He can't focus on it to study it.  His first TIA, he was losing the ability to speak and put sentences together. He and his wife were looking at something on the computer.  He went to lie down.  It started to diminish after 20 mins (happened in a [place] in [place]--2007 or 2008).  Since then, minor episodes only.  In 2014, he had carotid dopplers done when he didn't feel well during ROM exercises during PT and he became unresponsive.  He then came to and he thought he was fine.  CT in January of 2014 had showed mild age related changes and carotid and vertebral atherosclerosis.    He'd  like a podiatry referral.  He says he has difficulty seeing and reaching his feet and needs his toenails trimmed.  He was previously taken off of lovastatin and niacin by Dr. Nyoka Cowden b/c he had requested for his med list to be reduced.   He did got back to the niacin.    Past Medical History:  Diagnosis Date  . Actinic keratosis 10/18/2012  . 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   . Atrophy, Fuchs' 12/10/2011  . 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 Cervicalgia 04/25/2013  . 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 Cyclitis, chronic 11/18/2012  . Deafness 10/18/2012  . Endothelial corneal dystrophy   . Essential hypertension 10/18/2012  . Hammer toe 04/24/2014  . Hyperlipidemia 10/18/2012  . Hypertension   . Intermediate stage nonexudative  age-related macular degeneration of both eyes 04/16/2016  . Nocturia 05/19/2016  . Occlusion and stenosis of carotid artery with cerebral infarction   . Other and unspecified hyperlipidemia   . Other specified cardiac dysrhythmias(427.89)   . Pain in joint, pelvic region and thigh   . Paresthesia 04/30/2015   Toes of both feet   . Peripheral vascular disease, unspecified ([place])   . Primary open angle glaucoma 09/25/2011  . 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  . Rosacea   . Seborrheic keratosis 04/24/2014  . Syncope 06/27/2013  . TIA (transient ischemic attack) 10/18/2012   States he has had about 5  times  . Unspecified glaucoma(365.9)   . Urine frequency     Past Surgical History:  Procedure Laterality Date  . CATARACT EXTRACTION  2005   bilateral  . CORNEAL TRANSPLANT  2010 left; 2013 right eye  . HERNIA REPAIR  1985   Left  . HERNIA REPAIR  03/2004   Right  . SHOULDER DEBRIDEMENT Left 2005  . TONSILLECTOMY Bilateral childhood  . TRANSURETHRAL RESECTION OF PROSTATE N/A 10/2003  . VASECTOMY      No Known Allergies  Allergies as of 12/25/2016   No Known Allergies     Medication List       Accurate as of 12/25/16  8:35 AM. Always use your most recent med list.          aspirin 325 MG tablet Take 325 mg by mouth every evening.   COSOPT 22.3-6.8 MG/ML ophthalmic solution Generic drug:  dorzolamide-timolol 1 drop. One drop in each eye twice daily for glaucoma   enalapril 20 MG tablet Commonly known as:  VASOTEC Take one tablet by mouth twice daily to control blood pressure   Flaxseed Oil 1000 MG Caps Take by mouth.   metroNIDAZOLE 0.75 % gel Commonly known as:  METROGEL Apply topically. Apply twice daily   PRESERVISION AREDS 2 PO Take by mouth. Take one tablet twice daily for eyes   PROBIOTIC-10 PO Take by mouth.   TRAVATAN Z 0.004 % Soln ophthalmic solution Generic drug:  Travoprost (BAK Free) 1 drop. One drop left eye once a day for glaucoma       Review of  Systems:  Review of Systems  Constitutional: Positive for malaise/fatigue. Negative for chills and fever.  HENT: Positive for hearing loss. Negative for congestion.   Eyes: Positive for blurred vision.  Respiratory: Negative for cough and shortness of breath.   Cardiovascular: Positive for chest pain. Negative for palpitations and leg swelling.  Gastrointestinal: Negative for abdominal pain.  Genitourinary: Negative for dysuria.  Musculoskeletal: Positive for falls.  Skin: Negative for itching and rash.  Neurological: Negative for dizziness, loss of consciousness, weakness and headaches.  Endo/Heme/Allergies: Bruises/bleeds easily.  Psychiatric/Behavioral: Negative for depression and memory loss.    Health Maintenance  Topic Date Due  . PNA vac Low Risk Adult (2 of 2 - PPSV23) 03/21/2011  . INFLUENZA VACCINE  02/17/2017  . TETANUS/TDAP  06/26/2021    Physical Exam: Vitals:   12/25/16 0827  BP: (!) 142/70  Pulse: (!) 51  Temp: 98.5 F (36.9 C)  SpO2: 97%  Weight: 151 lb (68.5 kg)   Body mass index is 21.67 kg/m. Physical Exam  Constitutional: He is oriented to person, place, and time. He appears well-developed and well-nourished. No distress.  Cardiovascular: Normal rate, regular rhythm, normal heart sounds and intact distal pulses.   Pulmonary/Chest: Effort normal. No respiratory distress.  diminished breath sounds left base  Abdominal: Bowel sounds are normal.  Musculoskeletal: Normal range of motion.  Hammertoes; base of thumb tenderness  Neurological: He is alert and oriented to person, place, and time.  Skin: Skin is warm and dry.  Psychiatric: He has a normal mood and affect.    Labs reviewed: Basic Metabolic Panel:  Recent Labs  04/27/16  NA 143  K 4.0  BUN 19  CREATININE 0.9   Liver Function Tests:  Recent Labs  04/27/16  AST 19  ALT 14  ALKPHOS 45   No results for input(s): LIPASE, AMYLASE in the last 8760 hours. No results for input(s):  AMMONIA in the last 8760 hours. CBC: No results for input(s): WBC, NEUTROABS, HGB, HCT, MCV, PLT in the last 8760 hours. Lipid Panel:  Recent Labs  04/27/16 12/22/16 0822  CHOL 126 187  HDL 37 45  LDLCALC 75 126*  TRIG 69 78  CHOLHDL  --  4.2   No results found for: HGBA1C  Procedures since last visit: No results found. Reviewed CXR and ED records from [facility]  Assessment/Plan 1. Recurrent left pleural effusion - diminished breath sounds left base -pt had never heard from [facility] about his f/u CT so never got done, but appears he possibly missed the appt -advised we would arrange one for him and order placed - CT Chest Wo Contrast; Future -he is then to f/u with Dr. Bubba Camp at [facility] about results in clinic (I was told that clinic was booked until October?)  2. Pure hypercholesterolemia -cont niacin, restart lovastatin due to his TIAs and elevated LDL--he did not have difficulty tolerating it - lovastatin (MEVACOR) 20 MG tablet; Take 1 tablet (20 mg total) by mouth at bedtime.  Dispense: 90 tablet; Refill: 3  3. Injury of left thumb, initial encounter -s/p fall, has some tenderness at base of thumb/mcp and a palpable piece of gravel beneath the skin, but it has healed so wound not surgically remove this   4. Advance care planning - discussed over about 18 mins, his goals of care today especially focusing on his code status--he was quite clear that he would not want CPR or to be hooked up to machines to sustain his life, also has legal documents to support his wishes -goldenrod form was completed, copied to be scanned and original given to patient to keep on the back of the door as is the routine at [facility] - Do not attempt resuscitation (DNR)  5. Transient cerebral ischemia, unspecified type -seems he continues to have these episodes as would be expected with his vertebral and basilar artery atherosclerosis -maintain secondary preventative strategies with bp and lipid control given his  highly functional  status   6. Hammer toe, unspecified laterality -history of this and needs toenails trimmed b/c of vision declining and difficulty reaching his toes--podiatry referral placed  Labs/tests ordered:   Orders Placed This Encounter  Procedures  . CT Chest Wo Contrast    Standing Status:   Future    Standing Expiration Date:   02/24/2018    Order Specific Question:   Reason for Exam (SYMPTOM  OR DIAGNOSIS REQUIRED)    Answer:   left pleural effusion, had associated pleuritic chest pain    Order Specific Question:   Preferred imaging location?    Answer:   [id number]    Order Specific Question:   Radiology Contrast Protocol - do NOT remove file path    Answer:   [email or web address]    Next appt:  F/u 2 wks on CT chest (with Dr. Bubba Camp or me--first available)  Shawniece Oyola L. Dell Hurtubise, D.O. [facility] [address],  [address] Cell Phone (Mon-Fri 8am-5pm):  [phone] On Call:  [phone] & follow prompts after 5pm & weekends Office Phone:  [phone] Office Fax:  [phone]

## 2016-12-30 ENCOUNTER — Ambulatory Visit
Admission: RE | Admit: 2016-12-30 | Discharge: 2016-12-30 | Disposition: A | Discharge status: Home / Self Care | Payer: Medicare Other | Source: Ambulatory Visit | Attending: Internal Medicine | Admitting: Internal Medicine

## 2016-12-30 DIAGNOSIS — J90 Pleural effusion, not elsewhere classified: Secondary | ICD-10-CM

## 2016-12-30 DIAGNOSIS — R911 Solitary pulmonary nodule: Secondary | ICD-10-CM | POA: Diagnosis not present

## 2017-01-13 ENCOUNTER — Telehealth: Payer: Self-pay

## 2017-01-13 NOTE — Telephone Encounter (Signed)
Noted.  Pt did not have the prior CT that was ordered at [facility]--he did tell me he never went at the last appt.  Await current report.

## 2017-01-13 NOTE — Telephone Encounter (Signed)
I called [facility] this morning to follow-up on the status of CT results performed on 12/30/16  Per [facility]/Evette, report not resulted due to several attempts to contact [facility] for comparison films with no return call.   I called patient and patient states he never went to [facility] for a CT, there is nothing to compare results to  I then called Evette back at [place] and she states that she would have the CT department call me back for the report should be resulted by now and this process usually take 24-48 hours and she could not further explain why report is not in the system   Awaiting return call from the CT Department.

## 2017-01-14 NOTE — Telephone Encounter (Signed)
Still awaiting return call. I called [facility] and left message requesting return call.

## 2017-01-15 NOTE — Telephone Encounter (Signed)
Report resulted, see imaging tab

## 2017-01-25 ENCOUNTER — Ambulatory Visit (INDEPENDENT_AMBULATORY_CARE_PROVIDER_SITE_OTHER): Payer: Medicare Other | Admitting: Internal Medicine

## 2017-01-25 ENCOUNTER — Encounter: Payer: Self-pay | Admitting: Internal Medicine

## 2017-01-25 VITALS — BP 138/70 | HR 53 | Temp 97.9°F | Wt 149.0 lb

## 2017-01-25 DIAGNOSIS — J90 Pleural effusion, not elsewhere classified: Secondary | ICD-10-CM

## 2017-01-25 DIAGNOSIS — E7800 Pure hypercholesterolemia, unspecified: Secondary | ICD-10-CM

## 2017-01-25 DIAGNOSIS — G459 Transient cerebral ischemic attack, unspecified: Secondary | ICD-10-CM | POA: Diagnosis not present

## 2017-01-25 NOTE — Progress Notes (Signed)
Location:  [facility] clinic Provider:  Lakendra Helling L. Mariea Clonts, D.O., C.M.D.  Code Status: DNR Goals of Care:  Advanced Directives 01/25/2017  Does Patient Have a Medical Advance Directive? Yes  Type of Paramedic of [place];Out of facility DNR (pink MOST or yellow form)  Does patient want to make changes to medical advance directive? -  Copy of [place] in Chart? Yes  Pre-existing out of facility DNR order (yellow form or pink MOST form) Yellow form placed in chart (order not valid for inpatient use)   Chief Complaint  Patient presents with  . Follow-up    2 week follow-up on CT scan    HPI: Patient is a 83 y.o. male with h/o TIAs, pulmonary nodule, macular degeneration, Fuchs atrophy, hearing loss, hyperlipidemia, htn seen today for medical management of chronic diseases.    He did smoke for 20 years.  He did heavy duty cycling at 83 yo.  Thinks that saved him.  He no longer has the chest pain he was getting intermittently last visit.  CT chest was reviewed with him and did show a single nodule in June so a repeat is recommended in 1 year b/c of his smoking history.  He feels well w/o sob.    He also reports no further TIA spells.    His LDL had trended up when last checked and he was all for going back on statin therapy with his TIAs.  He does not want a stroke.  He's now on lovastatin. Will need f/u lipid before his appt with Dr. Bubba Camp.    Past Medical History:  Diagnosis Date  . Actinic keratosis 10/18/2012  . 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   . Atrophy, Fuchs' 12/10/2011  . 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 Cervicalgia 04/25/2013  . 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 Cyclitis, chronic 11/18/2012  . Deafness 10/18/2012  . Endothelial corneal dystrophy   . Essential hypertension 10/18/2012  . Hammer toe 04/24/2014  . Hyperlipidemia 10/18/2012  . Hypertension   . Intermediate  stage nonexudative age-related macular degeneration of both eyes 04/16/2016  . Nocturia 05/19/2016  . Occlusion and stenosis of carotid artery with cerebral infarction   . Other and unspecified hyperlipidemia   . Other specified cardiac dysrhythmias(427.89)   . Pain in joint, pelvic region and thigh   . Paresthesia 04/30/2015   Toes of both feet   . Peripheral vascular disease, unspecified ([place])   . Primary open angle glaucoma 09/25/2011  . 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  . Rosacea   . Seborrheic keratosis 04/24/2014  . Syncope 06/27/2013  . TIA (transient ischemic attack) 10/18/2012   States he has had about 5  times  . Unspecified glaucoma(365.9)   . Urine frequency     Past Surgical History:  Procedure Laterality Date  . CATARACT EXTRACTION  2005   bilateral  . CORNEAL TRANSPLANT  2010 left; 2013 right eye  . HERNIA REPAIR  1985   Left  . HERNIA REPAIR  03/2004   Right  . SHOULDER DEBRIDEMENT Left 2005  . TONSILLECTOMY Bilateral childhood  . TRANSURETHRAL RESECTION OF PROSTATE N/A 10/2003  . VASECTOMY      No Known Allergies  Allergies as of 01/25/2017   No Known Allergies     Medication List       Accurate as of 01/25/17 12:31 PM. Always use your most recent med list.  aspirin 325 MG tablet Take 325 mg by mouth every evening.   COSOPT 22.3-6.8 MG/ML ophthalmic solution Generic drug:  dorzolamide-timolol 1 drop. One drop in each eye twice daily for glaucoma   enalapril 20 MG tablet Commonly known as:  VASOTEC Take one tablet by mouth twice daily to control blood pressure   Flaxseed Oil 1000 MG Caps Take by mouth.   lovastatin 20 MG tablet Commonly known as:  MEVACOR Take 1 tablet (20 mg total) by mouth at bedtime.   metroNIDAZOLE 0.75 % gel Commonly known as:  METROGEL Apply topically. Apply twice daily   PRESERVISION AREDS 2 PO Take by mouth. Take one tablet twice daily for eyes   PROBIOTIC-10 PO Take by mouth.   TRAVATAN Z 0.004 % Soln ophthalmic  solution Generic drug:  Travoprost (BAK Free) 1 drop. One drop left eye once a day for glaucoma       Review of Systems:  Review of Systems  Constitutional: Negative for chills, fever and malaise/fatigue.  HENT: Positive for hearing loss.   Eyes: Positive for blurred vision.  Respiratory: Negative for cough, shortness of breath and wheezing.   Cardiovascular: Negative for chest pain, palpitations and leg swelling.  Gastrointestinal: Negative for abdominal pain.  Genitourinary: Negative for dysuria.  Musculoskeletal: Negative for falls.  Neurological: Negative for dizziness, loss of consciousness and weakness.  Endo/Heme/Allergies: Bruises/bleeds easily.  Psychiatric/Behavioral: Negative for memory loss.    Health Maintenance  Topic Date Due  . PNA vac Low Risk Adult (2 of 2 - PPSV23) 03/21/2011  . INFLUENZA VACCINE  02/17/2017  . TETANUS/TDAP  06/26/2021    Physical Exam: Vitals:   01/25/17 1153  BP: 138/70  Pulse: (!) 53  Temp: 97.9 F (36.6 C)  TempSrc: Oral  SpO2: 96%  Weight: 149 lb (67.6 kg)   Body mass index is 21.38 kg/m. Physical Exam  Constitutional: He is oriented to person, place, and time. He appears well-developed and well-nourished. No distress.  Cardiovascular: Normal rate, regular rhythm, normal heart sounds and intact distal pulses.   Pulmonary/Chest: Effort normal and breath sounds normal. He has no wheezes. He has no rales.  Abdominal: Bowel sounds are normal.  Musculoskeletal: Normal range of motion.  Neurological: He is alert and oriented to person, place, and time. No cranial nerve deficit.  Skin: Skin is warm and dry. Capillary refill takes less than 2 seconds.  Psychiatric: He has a normal mood and affect.    Labs reviewed: Basic Metabolic Panel:  Recent Labs  04/27/16  NA 143  K 4.0  BUN 19  CREATININE 0.9   Liver Function Tests:  Recent Labs  04/27/16  AST 19  ALT 14  ALKPHOS 45   No results for input(s): LIPASE, AMYLASE  in the last 8760 hours. No results for input(s): AMMONIA in the last 8760 hours. CBC: No results for input(s): WBC, NEUTROABS, HGB, HCT, MCV, PLT in the last 8760 hours. Lipid Panel:  Recent Labs  04/27/16 12/22/16 0822  CHOL 126 187  HDL 37 45  LDLCALC 75 126*  TRIG 69 78  CHOLHDL  --  4.2   No results found for: HGBA1C  Procedures since last visit: Ct Chest Wo Contrast  Result Date: 01/14/2017 CLINICAL DATA:  Followup LEFT basilar effusion or infiltrate. Findings identified on chest radiograph 11/21/2016. Only the report is available for comparison. Interpretation was delayed while attempts were made to acquire comparisons. Attempts unsuccessful. EXAM: CT CHEST WITHOUT CONTRAST TECHNIQUE: Multidetector CT imaging of the chest  was performed following the standard protocol without IV contrast. COMPARISON:  Chest x-ray report 12/14/2016 FINDINGS: Cardiovascular: Coronary artery calcification and aortic atherosclerotic calcification. Mediastinum/Nodes: No axillary or supraclavicular lymphadenopathy. No mediastinal hilar lymphadenopathy. No pericardial fluid. Esophagus normal. Lungs/Pleura: Minimal atelectasis and interlobular septal thickening in the LEFT lower lobe. No evidence airspace disease to suggests pneumonia. No suspicious nodularity. No bronchiectasis. Minimal centrilobular emphysema the upper lobes. Within the RIGHT upper lobe 3 mm nodule noted (image 70, series 5). Upper Abdomen: Limited view of the liver, kidneys, pancreas are unremarkable. Normal adrenal glands. Some low-density lesions in the liver and kidneys appear benign. Musculoskeletal: No aggressive osseous lesion IMPRESSION: 1. Mild atelectasis at the LEFT lung base. No evidence of pulmonary infection. 2. Mild centrilobular emphysema. 3. Small RIGHT upper lobe pulmonary nodule. No follow-up needed if patient is low-risk. Non-contrast chest CT can be considered in 12 months if patient is high-risk. This recommendation follows  the consensus statement: Guidelines for Management of Incidental Pulmonary Nodules Detected on CT Images: From the Fleischner Society 2017; Radiology 2017; 284:228-243. 4. Coronary calcifications and Aortic Atherosclerosis (ICD10-I70.0). Electronically Signed   By: Suzy Bouchard M.D.   On: 01/14/2017 15:59    Assessment/Plan 1. Recurrent left pleural effusion -no longer seen on CT chest -unclear etiology as not tested at initial presentation and quite small -no longer having any chest pain or dyspnea either  2. Pure hypercholesterolemia -cont lovastatin therapy/moderate dose only due to age and risk of myopathy/side effects - Lipid panel; Future  3. Transient cerebral ischemia, unspecified type -due to TIAs, would like for him to cont mod dose statin vs nothing to help with stroke prevention - Lipid panel; Future -outside of his TIAs and age-related sensory losses, he's a pretty good 89 so prognosis is good if he does not have a stroke event  Labs/tests ordered:   Orders Placed This Encounter  Procedures  . Lipid panel    Before [facility] clinic with Dr. Bubba Camp    Standing Status:   Future    Standing Expiration Date:   01/25/2018    Order Specific Question:   Has the patient fasted?    Answer:   Yes    Next appt:  F/u in [employer] in about 3 mos--recommended he make his appt now, but he apparently did not  Treyana Sturgell L. Cielo Arias, D.O. [facility] [address], [place] [address] Cell Phone (Mon-Fri 8am-5pm):  [phone] On Call:  [phone] & follow prompts after 5pm & weekends Office Phone:  [phone] Office Fax:  [phone]

## 2017-01-29 DIAGNOSIS — B351 Tinea unguium: Secondary | ICD-10-CM | POA: Diagnosis not present

## 2017-01-29 DIAGNOSIS — M79671 Pain in right foot: Secondary | ICD-10-CM | POA: Diagnosis not present

## 2017-01-29 DIAGNOSIS — M79672 Pain in left foot: Secondary | ICD-10-CM | POA: Diagnosis not present

## 2017-01-29 DIAGNOSIS — Q6689 Other  specified congenital deformities of feet: Secondary | ICD-10-CM | POA: Diagnosis not present

## 2017-02-17 DIAGNOSIS — Z947 Corneal transplant status: Secondary | ICD-10-CM | POA: Diagnosis not present

## 2017-02-17 DIAGNOSIS — H35351 Cystoid macular degeneration, right eye: Secondary | ICD-10-CM | POA: Diagnosis not present

## 2017-02-17 DIAGNOSIS — H401131 Primary open-angle glaucoma, bilateral, mild stage: Secondary | ICD-10-CM | POA: Diagnosis not present

## 2017-02-17 DIAGNOSIS — H353132 Nonexudative age-related macular degeneration, bilateral, intermediate dry stage: Secondary | ICD-10-CM | POA: Diagnosis not present

## 2017-04-05 ENCOUNTER — Non-Acute Institutional Stay: Payer: Medicare Other | Admitting: Internal Medicine

## 2017-04-05 ENCOUNTER — Encounter: Payer: Self-pay | Admitting: Internal Medicine

## 2017-04-05 VITALS — BP 122/64 | HR 66 | Temp 97.8°F | Resp 18 | Ht 70.0 in | Wt 149.8 lb

## 2017-04-05 DIAGNOSIS — Z8673 Personal history of transient ischemic attack (TIA), and cerebral infarction without residual deficits: Secondary | ICD-10-CM | POA: Diagnosis not present

## 2017-04-05 DIAGNOSIS — F4321 Adjustment disorder with depressed mood: Secondary | ICD-10-CM

## 2017-04-05 DIAGNOSIS — R634 Abnormal weight loss: Secondary | ICD-10-CM

## 2017-04-05 NOTE — Progress Notes (Signed)
[facility]  Provider: Blanchie Serve MD   Location:  [place] of Service:  Clinic (12)  PCP: Blanchie Serve, MD Patient Care Team: Blanchie Serve, MD as PCP - General (Internal Medicine) Oval, [facility] Crista Luria, MD as Consulting Physician (Dermatology) Marilynne Halsted, MD as Referring Physician (Ophthalmology) Bond, Tracie Harrier, MD as Referring Physician (Ophthalmology)  Extended Emergency Contact Information Primary Emergency Contact: Gwyndolyn Saxon Address: [address], [place] [address] [place] Phone: [phone] Relation: Spouse Secondary Emergency Contact: Tawny Hopping [place] Phone: [phone] Relation: Son  Code Status: DNR  Goals of Care: Advanced Directive information Advanced Directives 01/25/2017  Does Patient Have a Medical Advance Directive? Yes  Type of Paramedic of [place];Out of facility DNR (pink MOST or yellow form)  Does patient want to make changes to medical advance directive? -  Copy of [place] in Chart? Yes  Pre-existing out of facility DNR order (yellow form or pink MOST form) Yellow form placed in chart (order not valid for inpatient use)      Chief Complaint  Patient presents with  . Acute Visit    WEIGHT LOSS  . Medication Refill    No refill neede at this time.    HPI: Patient is a 83 y.o. male seen today for acute visit. He is concerned about losing weight. He weighed about 157-158 lb around April 2018.he had travelled abroad then and was eating unidentified but healthy vegan food. Once back, he has been eating 3 meals a day but smaller portion and sometimes only protein shakes replaces his meals. His wife has been having health issues and is currently hospitalized. He has been under a lot of stress and does not feel like eating or tends to make  PBJ sandwich and eat. Appetite has been fair. Denies any nausea or vomiting. Denies abdominal pain, denies melena or frank blood in stool. He mentions that he does not live to eat. He likes to have a big breakfast followed by a decent lunch and a small dinner though he is not following this in a routine manner at present.  Of note, he was having pain to his left chest few months back and with concern for pleural effusion, he underwent CT chest. Reviewing result today showing minimal atelectasis and emphysema changes along with 3 mm pulmonary nodule to right lung. Today, pt denies chest pain and his breathing is stable.  Of note on chart review, pt weight 154 lb in 09/2016 and weight 158 lns in 10/2015.   Past Medical History:  Diagnosis Date  . Actinic keratosis 10/18/2012  . 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   . Atrophy, Fuchs' 12/10/2011  . 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 Cervicalgia 04/25/2013  . 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 Cyclitis, chronic 11/18/2012  . Deafness 10/18/2012  . Endothelial corneal dystrophy   . Essential hypertension 10/18/2012  . Hammer toe 04/24/2014  . Hyperlipidemia 10/18/2012  . Hypertension   . Intermediate stage nonexudative age-related macular degeneration of both eyes 04/16/2016  . Nocturia 05/19/2016  . Occlusion and stenosis of carotid artery with cerebral infarction   . Other and unspecified hyperlipidemia   . Other specified cardiac dysrhythmias(427.89)   .  Pain in joint, pelvic region and thigh   . Paresthesia 04/30/2015   Toes of both feet   . Peripheral vascular disease, unspecified ([place])   . Primary open angle glaucoma 09/25/2011  . 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  . Rosacea   . Seborrheic keratosis 04/24/2014  . Syncope 06/27/2013  . TIA (transient ischemic attack) 10/18/2012   States he has had about 5  times  . Unspecified glaucoma(365.9)   . Urine frequency    Past Surgical History:    Procedure Laterality Date  . CATARACT EXTRACTION  2005   bilateral  . CORNEAL TRANSPLANT  2010 left; 2013 right eye  . HERNIA REPAIR  1985   Left  . HERNIA REPAIR  03/2004   Right  . SHOULDER DEBRIDEMENT Left 2005  . TONSILLECTOMY Bilateral childhood  . TRANSURETHRAL RESECTION OF PROSTATE N/A 10/2003  . VASECTOMY      reports that he quit smoking about 52 years ago. He has a 25.00 pack-year smoking history. He has never used smokeless tobacco. He reports that he drinks about 0.6 oz of alcohol per week . He reports that he does not use drugs. Social History   Social History  . Marital status: Married    Spouse name: N/A  . Number of children: N/A  . Years of education: N/A   Occupational History  . retired Insurance claims handler    Social History Main Topics  . Smoking status: Former Smoker    Packs/day: 1.00    Years: 25.00    Quit date: 10/18/1964  . Smokeless tobacco: Never Used     Comment: quit 1966  . Alcohol use 0.6 oz/week    1 Glasses of wine per week     Comment: glass couple times a week  . Drug use: No  . Sexual activity: Not on file   Other Topics Concern  . Not on file   Social History Narrative   [facility]ives at [facility] since 08/21/2011   Married to [place] Will   Exercise: 3 x week bicycle   Previously smoked stopped 1966   Does not drink cafferinated beverage   Drinks one small glass of wine few nights a week     Functional Status Survey:    Family History  Problem Relation Age of Onset  . Cancer Mother   . Heart disease Father     Health Maintenance  Topic Date Due  . PNA vac Low Risk Adult (2 of 2 - PPSV23) 03/21/2011  . INFLUENZA VACCINE  02/17/2017  . TETANUS/TDAP  06/26/2021    No Known Allergies  Outpatient Encounter Prescriptions as of 04/05/2017  Medication Sig  . aspirin 325 MG tablet Take 325 mg by mouth every evening.  . dorzolamide-timolol (COSOPT) 22.3-6.8 MG/ML ophthalmic solution 1 drop. One drop in each eye  twice daily for glaucoma  . enalapril (VASOTEC) 20 MG tablet Take one tablet by mouth twice daily to control blood pressure  . lovastatin (MEVACOR) 20 MG tablet Take 1 tablet (20 mg total) by mouth at bedtime.  . metroNIDAZOLE (METROGEL) 0.75 % gel Apply 1 application topically as needed (For roscea).   . Probiotic Product (PROBIOTIC-10 PO) Take 1 tablet by mouth daily.   . Travoprost, BAK Free, (TRAVATAN Z) 0.004 % SOLN ophthalmic solution 1 drop. One drop left eye once a day for glaucoma  . [DISCONTINUED] Flaxseed, Linseed, (FLAXSEED OIL) 1000 MG CAPS Take by mouth.  . [DISCONTINUED] Multiple Vitamins-Minerals (PRESERVISION AREDS 2 PO)  Take by mouth. Take one tablet twice daily for eyes   No facility-administered encounter medications on file as of 04/05/2017.     Review of Systems  Constitutional: Positive for appetite change. Negative for chills, diaphoresis, fatigue and fever.  HENT: Positive for hearing loss. Negative for congestion, dental problem, mouth sores and trouble swallowing.   Respiratory: Negative for cough, choking and shortness of breath.   Cardiovascular: Negative for chest pain.  Gastrointestinal: Negative for abdominal distention, abdominal pain, anal bleeding, blood in stool, constipation, diarrhea, nausea and vomiting.  Genitourinary: Negative for dysuria.  Neurological: Negative for syncope and light-headedness.  Psychiatric/Behavioral: Positive for decreased concentration and dysphoric mood.    Vitals:   04/05/17 1334  BP: 122/64  Pulse: 66  Resp: 18  Temp: 97.8 F (36.6 C)  TempSrc: Oral  SpO2: 98%  Weight: 149 lb 12.8 oz (67.9 kg)  Height: '5\' 10"'  (1.778 m)   Body mass index is 21.49 kg/m.   Wt Readings from Last 3 Encounters:  04/05/17 149 lb 12.8 oz (67.9 kg)  01/25/17 149 lb (67.6 kg)  12/25/16 151 lb (68.5 kg)   Physical Exam  Constitutional: He is oriented to person, place, and time. He appears well-developed and well-nourished. No distress.    HENT:  Head: Normocephalic and atraumatic.  Nose: Nose normal.  Mouth/Throat: Oropharynx is clear and moist. No oropharyngeal exudate.  Eyes: Pupils are equal, round, and reactive to light. Conjunctivae and EOM are normal.  Neck: Normal range of motion. Neck supple.  Cardiovascular: Normal rate and regular rhythm.   Pulmonary/Chest: Effort normal and breath sounds normal. No respiratory distress. He has no wheezes. He has no rales. He exhibits no tenderness.  Abdominal: Soft. Bowel sounds are normal. He exhibits no distension and no mass. There is no tenderness. There is no rebound and no guarding.  Musculoskeletal: Normal range of motion. He exhibits no edema.  Lymphadenopathy:    He has no cervical adenopathy.  Neurological: He is alert and oriented to person, place, and time.  Skin: Skin is warm and dry. He is not diaphoretic.  Psychiatric:  Tearful this visit, wife is being transitioned to hospice and he finds it difficult to come in terms with this    Labs reviewed: Basic Metabolic Panel:  Recent Labs  04/27/16  NA 143  K 4.0  BUN 19  CREATININE 0.9   Liver Function Tests:  Recent Labs  04/27/16  AST 19  ALT 14  ALKPHOS 45   No results for input(s): LIPASE, AMYLASE in the last 8760 hours. No results for input(s): AMMONIA in the last 8760 hours. CBC: No results for input(s): WBC, NEUTROABS, HGB, HCT, MCV, PLT in the last 8760 hours. Cardiac Enzymes: No results for input(s): CKTOTAL, CKMB, CKMBINDEX, TROPONINI in the last 8760 hours. BNP: Invalid input(s): POCBNP No results found for: HGBA1C No results found for: TSH No results found for: VITAMINB12 No results found for: FOLATE No results found for: IRON, TIBC, FERRITIN  Lipid Panel:  Recent Labs  04/27/16 12/22/16 0822  CHOL 126 187  HDL 37 45  LDLCALC 75 126*  TRIG 69 78  CHOLHDL  --  4.2   No results found for: HGBA1C  Procedures since last visit: No results found.  Assessment/Plan  1.  Weight loss Lost around 9 lbs in 1 year. Under stress with his wife's health situation currently. Normal exam finding. Concern for appetite being effected by depression and anxiety. Would attempt a trial of remeron but pt  refuses any medication at present. Supportive care. Check labs as below - CMP with eGFR; Future - CBC (no diff); Future - TSH; Future  2. History of TIA (transient ischemic attack) Continue aspiirn and statin with antihypertensive - CMP with eGFR; Future - CBC (no diff); Future  3. Situational depression Supportive care for now. Has mild depression in PHQ 9 score. Pt does not want to try any medication for now. He also refuses psychiatry and psychology service.  Denies suicidal ideation. Pt to notify if his symptom worsens. counselling provided to him.  Depression screen [facility] 2/9 04/05/2017 01/25/2017 12/25/2016 05/19/2016 10/29/2015  Decreased Interest 3 0 0 0 0  Down, Depressed, Hopeless 2 0 0 0 0  PHQ - 2 Score 5 0 0 0 0  Altered sleeping 0 - - - -  Tired, decreased energy 1 - - - -  Change in appetite 1 - - - -  Feeling bad or failure about yourself  1 - - - -  Trouble concentrating 1 - - - -  Moving slowly or fidgety/restless 0 - - - -  Suicidal thoughts 0 - - - -  PHQ-9 Score 9 - - - -  Difficult doing work/chores Somewhat difficult - - - -      Labs/tests ordered:  As above  Next appointment: 1 month for RV with MMSE and PHQ screen  Communication: reviewed care plan with patient and agrees  Blanchie Serve, MD Internal Medicine [facility] [address] [place],  [address] Cell Phone (Monday-Friday 8 am - 5 pm): [phone] On Call: [phone] and follow prompts after 5 pm and on weekends Office Phone: [phone] Office Fax: [phone]

## 2017-04-26 DIAGNOSIS — R634 Abnormal weight loss: Secondary | ICD-10-CM | POA: Diagnosis not present

## 2017-04-26 DIAGNOSIS — Z8673 Personal history of transient ischemic attack (TIA), and cerebral infarction without residual deficits: Secondary | ICD-10-CM | POA: Diagnosis not present

## 2017-04-26 LAB — COMPLETE METABOLIC PANEL WITH GFR
AG Ratio: 2.1 (calc) (ref 1.0–2.5)
ALBUMIN MSPROF: 3.9 g/dL (ref 3.6–5.1)
ALT: 17 U/L (ref 9–46)
AST: 21 U/L (ref 10–35)
Alkaline phosphatase (APISO): 51 U/L (ref 40–115)
BUN: 16 mg/dL (ref 7–25)
CALCIUM: 8.6 mg/dL (ref 8.6–10.3)
CO2: 28 mmol/L (ref 20–32)
CREATININE: 0.9 mg/dL (ref 0.70–1.11)
Chloride: 105 mmol/L (ref 98–110)
GFR, EST AFRICAN AMERICAN: 88 mL/min/{1.73_m2} (ref >=60)
GFR, EST NON AFRICAN AMERICAN: 76 mL/min/{1.73_m2} (ref >=60)
GLOBULIN: 1.9 g/dL (ref 1.9–3.7)
Glucose, Bld: 85 mg/dL (ref 65–99)
Potassium: 3.8 mmol/L (ref 3.5–5.3)
SODIUM: 141 mmol/L (ref 135–146)
Total Bilirubin: 0.9 mg/dL (ref 0.2–1.2)
Total Protein: 5.8 g/dL — ABNORMAL LOW (ref 6.1–8.1)

## 2017-04-26 LAB — TSH: TSH: 2.52 mIU/L (ref 0.40–4.50)

## 2017-04-26 LAB — CBC
HEMATOCRIT: 44.3 % (ref 38.5–50.0)
Hemoglobin: 15.2 g/dL (ref 13.2–17.1)
MCH: 30 pg (ref 27.0–33.0)
MCHC: 34.3 g/dL (ref 32.0–36.0)
MCV: 87.4 fL (ref 80.0–100.0)
MPV: 10.1 fL (ref 7.5–12.5)
Platelets: 162 10*3/uL (ref 140–400)
RBC: 5.07 10*6/uL (ref 4.20–5.80)
RDW: 12.5 % (ref 11.0–15.0)
WBC: 6.8 10*3/uL (ref 3.8–10.8)

## 2017-04-28 DIAGNOSIS — Z23 Encounter for immunization: Secondary | ICD-10-CM | POA: Diagnosis not present

## 2017-05-03 ENCOUNTER — Encounter: Payer: Self-pay | Admitting: Internal Medicine

## 2017-05-03 ENCOUNTER — Non-Acute Institutional Stay: Payer: Medicare Other | Admitting: Internal Medicine

## 2017-05-03 VITALS — BP 128/66 | HR 58 | Temp 97.8°F | Ht 70.0 in | Wt 148.6 lb

## 2017-05-03 DIAGNOSIS — E785 Hyperlipidemia, unspecified: Secondary | ICD-10-CM | POA: Diagnosis not present

## 2017-05-03 DIAGNOSIS — R634 Abnormal weight loss: Secondary | ICD-10-CM

## 2017-05-03 DIAGNOSIS — R911 Solitary pulmonary nodule: Secondary | ICD-10-CM | POA: Diagnosis not present

## 2017-05-03 DIAGNOSIS — I10 Essential (primary) hypertension: Secondary | ICD-10-CM | POA: Diagnosis not present

## 2017-05-03 DIAGNOSIS — Z8673 Personal history of transient ischemic attack (TIA), and cerebral infarction without residual deficits: Secondary | ICD-10-CM

## 2017-05-03 NOTE — Progress Notes (Signed)
[facility]  Provider: Blanchie Serve MD   Location:  [place] of Service:  Clinic (12)  PCP: Blanchie Serve, MD Patient Care Team: Blanchie Serve, MD as PCP - General (Internal Medicine) [place], [facility] Crista Luria, MD as Consulting Physician (Dermatology) Marilynne Halsted, MD as Referring Physician (Ophthalmology) Bond, Tracie Harrier, MD as Referring Physician (Ophthalmology)  Extended Emergency Contact Information Primary Emergency Contact: Gwyndolyn Saxon Address: [address], [place] [address] [place] Phone: [phone] Relation: Spouse Secondary Emergency Contact: Tawny Hopping [place] of [place] Phone: [phone] Relation: Son  Code Status: DNR  Goals of Care: Advanced Directive information Advanced Directives 01/25/2017  Does Patient Have a Medical Advance Directive? Yes  Type of Paramedic of [place];Out of facility DNR (pink MOST or yellow form)  Does patient want to make changes to medical advance directive? -  Copy of [place] in Chart? Yes  Pre-existing out of facility DNR order (yellow form or pink MOST form) Yellow form placed in chart (order not valid for inpatient use)      Chief Complaint  Patient presents with  . Medical Management of Chronic Issues    routine visit  . Medication Refill    No refills needed at this time.   Marland Kitchen Results    Discuss labs    HPI: Patient is a 83 y.o. male seen today for routine visit.  History of TIA- denies any symptom suggestive of TIA recently. Currently on full strength aspirin and statin with antihypertensives.  HTN- BP stable at home. Denies chest pain or dyspnea. Currently takes enalapril.  Allergic rhinitis- controlled symptom. Takes diphenhydramine.   Hyperlipidemia- takes lovastatin, no side effect reported.  Weight loss- weight  has been stable on review now. Appetite good. Exercises regularly. on chart review, pt weight 154 lb in 09/2016 and weight 158 lns in 10/2015.    Past Medical History:  Diagnosis Date  . Actinic keratosis 10/18/2012  . 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   . Atrophy, Fuchs' 12/10/2011  . 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 Cervicalgia 04/25/2013  . 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 Cyclitis, chronic 11/18/2012  . Deafness 10/18/2012  . Endothelial corneal dystrophy   . Essential hypertension 10/18/2012  . Hammer toe 04/24/2014  . Hyperlipidemia 10/18/2012  . Hypertension   . Intermediate stage nonexudative age-related macular degeneration of both eyes 04/16/2016  . Nocturia 05/19/2016  . Occlusion and stenosis of carotid artery with cerebral infarction   . Other and unspecified hyperlipidemia   . Other specified cardiac dysrhythmias(427.89)   . Pain in joint, pelvic region and thigh   . Paresthesia 04/30/2015   Toes of both feet   . Peripheral vascular disease, unspecified ([place])   . Primary open angle glaucoma 09/25/2011  . 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  . Rosacea   . Seborrheic keratosis 04/24/2014  . Syncope 06/27/2013  . TIA (transient ischemic attack) 10/18/2012   States he has had about 5  times  . Unspecified glaucoma(365.9)   . Urine frequency    Past Surgical History:  Procedure Laterality Date  . CATARACT EXTRACTION  2005   bilateral  . CORNEAL TRANSPLANT  2010 left; 2013 right eye  . [place]  Left  . HERNIA REPAIR  03/2004   Right  . SHOULDER DEBRIDEMENT Left 2005  . TONSILLECTOMY Bilateral childhood  . TRANSURETHRAL RESECTION OF PROSTATE N/A 10/2003  . VASECTOMY      reports that he quit smoking about 52 years ago. He has a 25.00 pack-year smoking history. He has never used smokeless tobacco. He reports that he drinks about 0.6 oz of alcohol per week . He reports that he does not use drugs. Social  History   Social History  . Marital status: Married    Spouse name: N/A  . Number of children: N/A  . Years of education: N/A   Occupational History  . retired Insurance claims handler    Social History Main Topics  . Smoking status: Former Smoker    Packs/day: 1.00    Years: 25.00    Quit date: 10/18/1964  . Smokeless tobacco: Never Used     Comment: quit 1966  . Alcohol use 0.6 oz/week    1 Glasses of wine per week     Comment: glass couple times a week  . Drug use: No  . Sexual activity: Not on file   Other Topics Concern  . Not on file   Social History Narrative   Lives at [facility] since 08/21/2011   Married to [place] Will   Exercise: 3 x week bicycle   Previously smoked stopped 1966   Does not drink cafferinated beverage   Drinks one small glass of wine few nights a week     Functional Status Survey:    Family History  Problem Relation Age of Onset  . Cancer Mother   . Heart disease Father     Health Maintenance  Topic Date Due  . PNA vac Low Risk Adult (2 of 2 - PPSV23) 03/21/2011  . INFLUENZA VACCINE  02/17/2017  . TETANUS/TDAP  06/26/2021    No Known Allergies  Outpatient Encounter Prescriptions as of 05/03/2017  Medication Sig  . aspirin 325 MG tablet Take 325 mg by mouth every evening.  . DiphenhydrAMINE HCl (ALLERGY MEDICATION PO) Take 1 tablet by mouth daily.  . enalapril (VASOTEC) 20 MG tablet Take one tablet by mouth twice daily to control blood pressure  . lovastatin (MEVACOR) 20 MG tablet Take 1 tablet (20 mg total) by mouth at bedtime.  . metroNIDAZOLE (METROGEL) 0.75 % gel Apply 1 application topically as needed (For roscea).   . Multiple Vitamins-Minerals (PRESERVISION AREDS 2 PO) Take 1 tablet by mouth 2 (two) times daily.  . niacin (SLO-NIACIN) 500 MG tablet Take 500 mg by mouth at bedtime.  . Probiotic Product (PROBIOTIC-10 PO) Take 1 tablet by mouth daily.   . Travoprost, BAK Free, (TRAVATAN Z) 0.004 % SOLN ophthalmic  solution 1 drop. One drop left eye once a day for glaucoma  . [DISCONTINUED] dorzolamide-timolol (COSOPT) 22.3-6.8 MG/ML ophthalmic solution 1 drop. One drop in each eye twice daily for glaucoma   No facility-administered encounter medications on file as of 05/03/2017.     Review of Systems  Constitutional: Negative for appetite change, chills, diaphoresis, fatigue and fever.       Appetite is fair  HENT: Positive for hearing loss. Negative for congestion, dental problem, ear pain, mouth sores, rhinorrhea, sinus pressure and trouble swallowing.        Has hearing aid  Eyes: Negative for itching.  Respiratory: Negative for cough, choking, shortness of breath and wheezing.   Cardiovascular: Negative for chest pain,  palpitations and leg swelling.  Gastrointestinal: Negative for abdominal distention, abdominal pain, anal bleeding, blood in stool, constipation, diarrhea, nausea and vomiting.  Genitourinary: Negative for dysuria and hematuria.       Wakes up 4 times at night to urinate  Musculoskeletal: Negative for back pain and gait problem.  Skin: Negative for rash and wound.  Neurological: Negative for dizziness, syncope, light-headedness and headaches.  Psychiatric/Behavioral: Negative for behavioral problems, confusion, decreased concentration and dysphoric mood.    Vitals:   05/03/17 1347  BP: 128/66  Pulse: (!) 58  Temp: 97.8 F (36.6 C)  TempSrc: Oral  SpO2: 98%  Weight: 148 lb 9.6 oz (67.4 kg)  Height: '5\' 10"'  (1.778 m)   Body mass index is 21.32 kg/m.   Wt Readings from Last 3 Encounters:  05/03/17 148 lb 9.6 oz (67.4 kg)  04/05/17 149 lb 12.8 oz (67.9 kg)  01/25/17 149 lb (67.6 kg)   Physical Exam  Constitutional: He is oriented to person, place, and time. He appears well-developed and well-nourished. No distress.  HENT:  Head: Normocephalic and atraumatic.  Nose: Nose normal.  Mouth/Throat: Oropharynx is clear and moist. No oropharyngeal exudate.  Eyes: Pupils  are equal, round, and reactive to light. Conjunctivae and EOM are normal.  Neck: Normal range of motion. Neck supple.  Cardiovascular: Normal rate and regular rhythm.   Pulmonary/Chest: Effort normal and breath sounds normal. No respiratory distress. He has no wheezes. He has no rales. He exhibits no tenderness.  Abdominal: Soft. Bowel sounds are normal. He exhibits no distension and no mass. There is no tenderness. There is no rebound and no guarding.  Musculoskeletal: Normal range of motion. He exhibits no edema.  Lymphadenopathy:    He has no cervical adenopathy.  Neurological: He is alert and oriented to person, place, and time.  05/03/17 MMSE 30/30, PASSED clock draw  Skin: Skin is warm and dry. He is not diaphoretic.  Psychiatric:  In good mood today    Labs reviewed: Basic Metabolic Panel:  Recent Labs  04/26/17 0735  NA 141  K 3.8  CL 105  CO2 28  GLUCOSE 85  BUN 16  CREATININE 0.90  CALCIUM 8.6   Liver Function Tests:  Recent Labs  04/26/17 0735  AST 21  ALT 17  BILITOT 0.9  PROT 5.8*   No results for input(s): LIPASE, AMYLASE in the last 8760 hours. No results for input(s): AMMONIA in the last 8760 hours. CBC:  Recent Labs  04/26/17 0735  WBC 6.8  HGB 15.2  HCT 44.3  MCV 87.4  PLT 162   Cardiac Enzymes: No results for input(s): CKTOTAL, CKMB, CKMBINDEX, TROPONINI in the last 8760 hours. BNP: Invalid input(s): POCBNP No results found for: HGBA1C Lab Results  Component Value Date   TSH 2.52 04/26/2017   No results found for: VITAMINB12 No results found for: FOLATE No results found for: IRON, TIBC, FERRITIN  Lipid Panel:  Recent Labs  12/22/16 0822  CHOL 187  HDL 45  LDLCALC 126*  TRIG 78  CHOLHDL 4.2   No results found for: HGBA1C  Procedures since last visit: No results found.  Assessment/Plan  1. Hyperlipidemia, unspecified hyperlipidemia type Reviewed lipid panel, LDL not at goal. Continue lovastatin 20 mg daily  - Lipid  Panel; Future  2. Essential hypertension Continue enalapril, diet and exercise counselling provided - BMP with eGFR; Future  3. History of TIA (transient ischemic attack) Continue aspirin and statin, MMSE 30/30, passed clock draw. Mood stable  today.   4. Weight loss Stable weight for past few months. Appetite fair. Monitor clinically.   5. Pulmonary nodule Recheck CT chest without contrast in 12/2017 with his history of smoking.   Depression screen [facility] 2/9 05/03/2017 04/05/2017 01/25/2017 12/25/2016 05/19/2016  Decreased Interest 0 3 0 0 0  Down, Depressed, Hopeless 0 2 0 0 0  PHQ - 2 Score 0 5 0 0 0  Altered sleeping 0 0 - - -  Tired, decreased energy - 1 - - -  Change in appetite 0 1 - - -  Feeling bad or failure about yourself  0 1 - - -  Trouble concentrating 0 1 - - -  Moving slowly or fidgety/restless 0 0 - - -  Suicidal thoughts 0 0 - - -  PHQ-9 Score 0 9 - - -  Difficult doing work/chores - Somewhat difficult - - -    6. Allergic rhinitis Continue diphenhydramine for now.   Labs/tests ordered:  As above  Next appointment: 6 month for RV   Communication: reviewed care plan with patient and agrees  Blanchie Serve, MD Internal Medicine [place], [place] [address] Cell Phone (Monday-Friday 8 am - 5 pm): [phone] On Call: [phone] and follow prompts after 5 pm and on weekends Office Phone: [phone] Office Fax: [phone]

## 2017-05-04 ENCOUNTER — Encounter: Payer: Self-pay | Admitting: Family

## 2017-05-04 ENCOUNTER — Telehealth: Payer: Self-pay | Admitting: Family

## 2017-05-04 ENCOUNTER — Non-Acute Institutional Stay: Payer: Medicare Other | Admitting: Family

## 2017-05-04 VITALS — BP 134/66 | HR 72 | Temp 97.6°F | Resp 16 | Ht 70.0 in | Wt 148.0 lb

## 2017-05-04 DIAGNOSIS — S30821A Blister (nonthermal) of abdominal wall, initial encounter: Secondary | ICD-10-CM

## 2017-05-04 DIAGNOSIS — M79671 Pain in right foot: Secondary | ICD-10-CM | POA: Diagnosis not present

## 2017-05-04 DIAGNOSIS — M79672 Pain in left foot: Secondary | ICD-10-CM | POA: Diagnosis not present

## 2017-05-04 DIAGNOSIS — M25511 Pain in right shoulder: Secondary | ICD-10-CM | POA: Diagnosis not present

## 2017-05-04 DIAGNOSIS — B351 Tinea unguium: Secondary | ICD-10-CM | POA: Diagnosis not present

## 2017-05-04 NOTE — Progress Notes (Signed)
Location:  [place] of Service:  Clinic (12) Provider: Dinah Ngetich FNP-C  Blanchie Serve, MD  Patient Care Team: Blanchie Serve, MD as PCP - General (Internal Medicine) [place], [facility] Crista Luria, MD as Consulting Physician (Dermatology) Marilynne Halsted, MD as Referring Physician (Ophthalmology) Bond, Tracie Harrier, MD as Referring Physician (Ophthalmology)  Extended Emergency Contact Information Primary Emergency Contact: Gwyndolyn Saxon Address: [address] [place], [place] [address] [place] Phone: [phone] Relation: Spouse Secondary Emergency Contact: Tawny Hopping States of [place] Phone: [phone] Relation: Son  Code Status:  DNR Goals of care: Advanced Directive information Advanced Directives 01/25/2017  Does Patient Have a Medical Advance Directive? Yes  Type of Paramedic of [place];Out of facility DNR (pink MOST or yellow form)  Does patient want to make changes to medical advance directive? -  Copy of [place] in Chart? Yes  Pre-existing out of facility DNR order (yellow form or pink MOST form) Yellow form placed in chart (order not valid for inpatient use)     Chief Complaint  Patient presents with  . Acute Visit    insect bites on abdomen; right shoulder pain    HPI:  Pt is a 83 y.o. male seen today at [facility] for an acute visit for evaluation of right shoulder pain and abdominal insect bite.He states was attending an exercise class and noticed he could not stretch right shoulder like the left shoulder.He states right shoulder painful and radiates down to right upper arm whenever he lifts.He also complains of insect bite to left side of abdomen. He thinks some thing must have bitten him but does not recall when. He states site gets itchy and feels like scratching. He denies any fever, chills or  injury to right shoulder. Of note patient's wife was recently treated for shingles over in the skilled Nursing facility and wife visits on a daily basis.   Past Medical History:  Diagnosis Date  . Actinic keratosis 10/18/2012  . 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   . Atrophy, Fuchs' 12/10/2011  . 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 Cervicalgia 04/25/2013  . 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 Cyclitis, chronic 11/18/2012  . Deafness 10/18/2012  . Endothelial corneal dystrophy   . Essential hypertension 10/18/2012  . Hammer toe 04/24/2014  . Hyperlipidemia 10/18/2012  . Hypertension   . Intermediate stage nonexudative age-related macular degeneration of both eyes 04/16/2016  . Nocturia 05/19/2016  . Occlusion and stenosis of carotid artery with cerebral infarction   . Other and unspecified hyperlipidemia   . Other specified cardiac dysrhythmias(427.89)   . Pain in joint, pelvic region and thigh   . Paresthesia 04/30/2015   Toes of both feet   . Peripheral vascular disease, unspecified ([place])   . Primary open angle glaucoma 09/25/2011  . 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  . Rosacea   . Seborrheic keratosis 04/24/2014  . Syncope 06/27/2013  . TIA (transient ischemic attack) 10/18/2012   States he has had about 5  times  . Unspecified glaucoma(365.9)   . Urine frequency    Past Surgical History:  Procedure Laterality Date  . CATARACT EXTRACTION  2005   bilateral  . CORNEAL TRANSPLANT  2010 left; 2013 right eye  . HERNIA REPAIR  1985   Left  . HERNIA REPAIR  03/2004   Right  . SHOULDER DEBRIDEMENT Left 2005  . TONSILLECTOMY Bilateral childhood  . TRANSURETHRAL RESECTION OF PROSTATE N/A 10/2003  . VASECTOMY      No Known Allergies  Outpatient Encounter Prescriptions as of 05/04/2017  Medication Sig  . aspirin 325 MG tablet Take 325 mg by mouth every evening.  . DiphenhydrAMINE HCl (ALLERGY MEDICATION PO) Take 1 tablet by mouth  daily.  . dorzolamide (TRUSOPT) 2 % ophthalmic solution Place 1 drop into both eyes 2 (two) times daily.  . enalapril (VASOTEC) 20 MG tablet Take one tablet by mouth twice daily to control blood pressure  . lovastatin (MEVACOR) 20 MG tablet Take 1 tablet (20 mg total) by mouth at bedtime.  . metroNIDAZOLE (METROGEL) 0.75 % gel Apply 1 application topically as needed (For roscea).   . Multiple Vitamins-Minerals (PRESERVISION AREDS 2 PO) Take 1 tablet by mouth 2 (two) times daily.  . niacin (SLO-NIACIN) 500 MG tablet Take 500 mg by mouth at bedtime.  . Probiotic Product (PROBIOTIC-10 PO) Take 1 tablet by mouth daily.   . timolol (BETIMOL) 0.5 % ophthalmic solution Place 1 drop into both eyes 2 (two) times daily.  . Travoprost, BAK Free, (TRAVATAN Z) 0.004 % SOLN ophthalmic solution 1 drop. One drop left eye once a day for glaucoma   No facility-administered encounter medications on file as of 05/04/2017.     Review of Systems  Constitutional: Negative for activity change, appetite change, chills, fatigue and fever.  Eyes: Negative for discharge and redness.  Respiratory: Negative for cough, chest tightness, shortness of breath and wheezing.   Cardiovascular: Negative for chest pain and palpitations.  Gastrointestinal: Negative for abdominal pain, constipation, nausea and vomiting.  Musculoskeletal: Positive for arthralgias.  Skin: Negative for color change and pallor.       Left abdominal insect bite   Neurological: Negative for dizziness, syncope, light-headedness and headaches.  Psychiatric/Behavioral: Negative for agitation and sleep disturbance. The patient is not nervous/anxious.     Immunization History  Administered Date(s) Administered  . Influenza Whole 04/19/2012, 04/20/2013  . Influenza-Unspecified 05/03/2014, 04/18/2015, 04/30/2016, 04/28/2017  . Pneumococcal Conjugate-13 03/20/2010  . Tdap 06/27/2011   Pertinent  Health Maintenance Due  Topic Date Due  . PNA vac Low  Risk Adult (2 of 2 - PPSV23) 03/21/2011  . INFLUENZA VACCINE  Completed   Fall Risk  01/25/2017 12/25/2016 05/19/2016 10/29/2015 04/30/2015  Falls in the past year? No No No No No    Vitals:   05/04/17 1303  BP: 134/66  Pulse: 72  Resp: 16  Temp: 97.6 F (36.4 C)  TempSrc: Oral  SpO2: 98%  Weight: 148 lb (67.1 kg)  Height: 5\' 10"  (1.778 m)   Body mass index is 21.24 kg/m. Physical Exam  Constitutional: He is oriented to person, place, and time. He appears well-developed and well-nourished. No distress.  Elderly   HENT:  Head: Normocephalic.  Mouth/Throat: Oropharynx is clear and moist. No oropharyngeal exudate.  HOH hearing aids in place   Eyes: Pupils are equal, round, and reactive to light. Conjunctivae and EOM are normal. Right eye exhibits no discharge. Left eye exhibits no discharge. No scleral icterus.  Neck: Normal range of motion. No JVD present. No thyromegaly present.  Cardiovascular: Normal rate, regular rhythm, normal heart sounds and intact distal pulses.  Exam reveals no gallop and no friction rub.   No murmur heard. Pulmonary/Chest: Effort normal and breath sounds normal. No  respiratory distress. He has no wheezes. He has no rales.  Abdominal: Soft. Bowel sounds are normal. He exhibits no distension. There is no tenderness. There is no rebound and no guarding.  Musculoskeletal:  Right shoulder limited abduction ROM.Right shoulder negative for bruise, swelling or tender to palpation.   Lymphadenopathy:    He has no cervical adenopathy.  Neurological: He is oriented to person, place, and time. Coordination normal.  Skin: Skin is warm and dry. No rash noted. No erythema. No pallor.  Left upper and mid quadrant x 2 small vesicle. Surrounding skin tissue without any redness or tenderness.   Psychiatric: He has a normal mood and affect.   Labs reviewed:  Recent Labs  04/26/17 0735  NA 141  K 3.8  CL 105  CO2 28  GLUCOSE 85  BUN 16  CREATININE 0.90  CALCIUM  8.6    Recent Labs  04/26/17 0735  AST 21  ALT 17  BILITOT 0.9  PROT 5.8*    Recent Labs  04/26/17 0735  WBC 6.8  HGB 15.2  HCT 44.3  MCV 87.4  PLT 162   Lab Results  Component Value Date   TSH 2.52 04/26/2017   No results found for: HGBA1C Lab Results  Component Value Date   CHOL 187 12/22/2016   HDL 45 12/22/2016   LDLCALC 126 (H) 12/22/2016   TRIG 78 12/22/2016   CHOLHDL 4.2 12/22/2016    Significant Diagnostic Results in last 30 days:  No results found.  Assessment/Plan  1. Right shoulder pain Limited ROM with abduction.No redness, swelling,crepitus or tender to palpation.will obtain portable right shoulder X-ray 2-3 views to rule fracture. Will have him work with PT/OT for ROM, exercise and muscle strengthening if negative for fracture. Encourage to take tylenol as needed for pain.     2. Blister (nonthermal) of abdominal wall Afebrile.Left upper and mid quadrant x 2 small vesicle.Surrounding skin tissue without any redness or tenderness.No symptoms of burning or pain reported. Will monitor for now. CMA to contact patient 05/06/2017 to check if vesicles increased in number, any signs of pain or burning sensation to rule out shingles due to recent exposure to wife's shingles. Patient instructed on symptoms to report to provider.Cover vesicle with Band aid.   Family/ staff Communication: Reviewed plan of care with patient and facility Nurse.  Labs/tests ordered: portable right shoulder X-ray 2-3 views to rule fracture.   Sandrea Hughs, NP

## 2017-05-04 NOTE — Telephone Encounter (Signed)
Please call patient on 05/06/2017 to follow up left abdominal small blister for worsening symptoms: increased in number of blisters, redness, burning sensation or fever.

## 2017-05-06 DIAGNOSIS — M25511 Pain in right shoulder: Secondary | ICD-10-CM | POA: Diagnosis not present

## 2017-05-06 NOTE — Telephone Encounter (Signed)
Patient came by the office and Dinah visualized rash. Not any worse, seems to be improving.

## 2017-05-11 DIAGNOSIS — R29898 Other symptoms and signs involving the musculoskeletal system: Secondary | ICD-10-CM | POA: Diagnosis not present

## 2017-05-11 DIAGNOSIS — M6281 Muscle weakness (generalized): Secondary | ICD-10-CM | POA: Diagnosis not present

## 2017-05-11 DIAGNOSIS — M25511 Pain in right shoulder: Secondary | ICD-10-CM | POA: Diagnosis not present

## 2017-05-13 DIAGNOSIS — M25511 Pain in right shoulder: Secondary | ICD-10-CM | POA: Diagnosis not present

## 2017-05-13 DIAGNOSIS — M6281 Muscle weakness (generalized): Secondary | ICD-10-CM | POA: Diagnosis not present

## 2017-05-13 DIAGNOSIS — R29898 Other symptoms and signs involving the musculoskeletal system: Secondary | ICD-10-CM | POA: Diagnosis not present

## 2017-05-18 DIAGNOSIS — R29898 Other symptoms and signs involving the musculoskeletal system: Secondary | ICD-10-CM | POA: Diagnosis not present

## 2017-05-18 DIAGNOSIS — M25511 Pain in right shoulder: Secondary | ICD-10-CM | POA: Diagnosis not present

## 2017-05-18 DIAGNOSIS — M6281 Muscle weakness (generalized): Secondary | ICD-10-CM | POA: Diagnosis not present

## 2017-05-25 DIAGNOSIS — M25511 Pain in right shoulder: Secondary | ICD-10-CM | POA: Diagnosis not present

## 2017-05-25 DIAGNOSIS — M6281 Muscle weakness (generalized): Secondary | ICD-10-CM | POA: Diagnosis not present

## 2017-05-25 DIAGNOSIS — R29898 Other symptoms and signs involving the musculoskeletal system: Secondary | ICD-10-CM | POA: Diagnosis not present

## 2017-06-01 DIAGNOSIS — M6281 Muscle weakness (generalized): Secondary | ICD-10-CM | POA: Diagnosis not present

## 2017-06-01 DIAGNOSIS — R29898 Other symptoms and signs involving the musculoskeletal system: Secondary | ICD-10-CM | POA: Diagnosis not present

## 2017-06-01 DIAGNOSIS — M25511 Pain in right shoulder: Secondary | ICD-10-CM | POA: Diagnosis not present

## 2017-06-09 DIAGNOSIS — L814 Other melanin hyperpigmentation: Secondary | ICD-10-CM | POA: Diagnosis not present

## 2017-06-09 DIAGNOSIS — D692 Other nonthrombocytopenic purpura: Secondary | ICD-10-CM | POA: Diagnosis not present

## 2017-06-09 DIAGNOSIS — L570 Actinic keratosis: Secondary | ICD-10-CM | POA: Diagnosis not present

## 2017-06-09 DIAGNOSIS — D485 Neoplasm of uncertain behavior of skin: Secondary | ICD-10-CM | POA: Diagnosis not present

## 2017-06-09 DIAGNOSIS — L853 Xerosis cutis: Secondary | ICD-10-CM | POA: Diagnosis not present

## 2017-06-09 DIAGNOSIS — D1801 Hemangioma of skin and subcutaneous tissue: Secondary | ICD-10-CM | POA: Diagnosis not present

## 2017-06-09 DIAGNOSIS — L821 Other seborrheic keratosis: Secondary | ICD-10-CM | POA: Diagnosis not present

## 2017-06-17 DIAGNOSIS — H401131 Primary open-angle glaucoma, bilateral, mild stage: Secondary | ICD-10-CM | POA: Diagnosis not present

## 2017-06-17 DIAGNOSIS — H353132 Nonexudative age-related macular degeneration, bilateral, intermediate dry stage: Secondary | ICD-10-CM | POA: Diagnosis not present

## 2017-06-17 DIAGNOSIS — Z961 Presence of intraocular lens: Secondary | ICD-10-CM | POA: Diagnosis not present

## 2017-06-17 DIAGNOSIS — Z947 Corneal transplant status: Secondary | ICD-10-CM | POA: Diagnosis not present

## 2017-06-25 ENCOUNTER — Non-Acute Institutional Stay: Payer: Medicare Other

## 2017-06-25 VITALS — BP 132/72 | HR 66 | Temp 98.2°F | Ht 70.0 in | Wt 148.0 lb

## 2017-06-25 DIAGNOSIS — Z0000 Encounter for general adult medical examination without abnormal findings: Secondary | ICD-10-CM | POA: Diagnosis not present

## 2017-06-25 NOTE — Patient Instructions (Signed)
[name] , Thank you for taking time to come for your Medicare Wellness Visit. I appreciate your ongoing commitment to your health goals. Please review the following plan we discussed and let me know if I can assist you in the future.   Screening recommendations/referrals: Colonoscopy excluded, you are over age 81 Recommended yearly ophthalmology/optometry visit for glaucoma screening and checkup Recommended yearly dental visit for hygiene and checkup  Vaccinations: Influenza vaccine up to date. Due 2019 fall season Pneumococcal vaccine 23 due, sent to pharmacy Tdap vaccine up to date Due 06/26/2021 Shingles vaccine on waiting list at pharmacy   Advanced directives: In Chart  Conditions/risks identified: None  Next appointment: Dr. Bubba Camp 11/01/2016 @ 1:30pm  Preventive Care 59 Years and Older, Male Preventive care refers to lifestyle choices and visits with your health care provider that can promote health and wellness. What does preventive care include?  A yearly physical exam. This is also called an annual well check.  Dental exams once or twice a year.  Routine eye exams. Ask your health care provider how often you should have your eyes checked.  Personal lifestyle choices, including:  Daily care of your teeth and gums.  Regular physical activity.  Eating a healthy diet.  Avoiding tobacco and drug use.  Limiting alcohol use.  Practicing safe sex.  Taking low doses of aspirin every day.  Taking vitamin and mineral supplements as recommended by your health care provider. What happens during an annual well check? The services and screenings done by your health care provider during your annual well check will depend on your age, overall health, lifestyle risk factors, and family history of disease. Counseling  Your health care provider may ask you questions about your:  Alcohol use.  Tobacco use.  Drug use.  Emotional well-being.  Home and relationship  well-being.  Sexual activity.  Eating habits.  History of falls.  Memory and ability to understand (cognition).  Work and work Statistician. Screening  You may have the following tests or measurements:  Height, weight, and BMI.  Blood pressure.  Lipid and cholesterol levels. These may be checked every 5 years, or more frequently if you are over 78 years old.  Skin check.  Lung cancer screening. You may have this screening every year starting at age 56 if you have a 30-pack-year history of smoking and currently smoke or have quit within the past 15 years.  Fecal occult blood test (FOBT) of the stool. You may have this test every year starting at age 58.  Flexible sigmoidoscopy or colonoscopy. You may have a sigmoidoscopy every 5 years or a colonoscopy every 10 years starting at age 54.  Prostate cancer screening. Recommendations will vary depending on your family history and other risks.  Hepatitis C blood test.  Hepatitis B blood test.  Sexually transmitted disease (STD) testing.  Diabetes screening. This is done by checking your blood sugar (glucose) after you have not eaten for a while (fasting). You may have this done every 1-3 years.  Abdominal aortic aneurysm (AAA) screening. You may need this if you are a current or former smoker.  Osteoporosis. You may be screened starting at age 34 if you are at high risk. Talk with your health care provider about your test results, treatment options, and if necessary, the need for more tests. Vaccines  Your health care provider may recommend certain vaccines, such as:  Influenza vaccine. This is recommended every year.  Tetanus, diphtheria, and acellular pertussis (Tdap, Td) vaccine.  You may need a Td booster every 10 years.  Zoster vaccine. You may need this after age 85.  Pneumococcal 13-valent conjugate (PCV13) vaccine. One dose is recommended after age 75.  Pneumococcal polysaccharide (PPSV23) vaccine. One dose is  recommended after age 44. Talk to your health care provider about which screenings and vaccines you need and how often you need them. This information is not intended to replace advice given to you by your health care provider. Make sure you discuss any questions you have with your health care provider. Document Released: 08/02/2015 Document Revised: 03/25/2016 Document Reviewed: 05/07/2015 Elsevier Interactive Patient Education  2017 [place] Prevention in the Home Falls can cause injuries. They can happen to people of all ages. There are many things you can do to make your home safe and to help prevent falls. What can I do on the outside of my home?  Regularly fix the edges of walkways and driveways and fix any cracks.  Remove anything that might make you trip as you walk through a door, such as a raised step or threshold.  Trim any bushes or trees on the path to your home.  Use bright outdoor lighting.  Clear any walking paths of anything that might make someone trip, such as rocks or tools.  Regularly check to see if handrails are loose or broken. Make sure that both sides of any steps have handrails.  Any raised decks and porches should have guardrails on the edges.  Have any leaves, snow, or ice cleared regularly.  Use sand or salt on walking paths during winter.  Clean up any spills in your garage right away. This includes oil or grease spills. What can I do in the bathroom?  Use night lights.  Install grab bars by the toilet and in the tub and shower. Do not use towel bars as grab bars.  Use non-skid mats or decals in the tub or shower.  If you need to sit down in the shower, use a plastic, non-slip stool.  Keep the floor dry. Clean up any water that spills on the floor as soon as it happens.  Remove soap buildup in the tub or shower regularly.  Attach bath mats securely with double-sided non-slip rug tape.  Do not have throw rugs and other things on  the floor that can make you trip. What can I do in the bedroom?  Use night lights.  Make sure that you have a light by your bed that is easy to reach.  Do not use any sheets or blankets that are too big for your bed. They should not hang down onto the floor.  Have a firm chair that has side arms. You can use this for support while you get dressed.  Do not have throw rugs and other things on the floor that can make you trip. What can I do in the kitchen?  Clean up any spills right away.  Avoid walking on wet floors.  Keep items that you use a lot in easy-to-reach places.  If you need to reach something above you, use a strong step stool that has a grab bar.  Keep electrical cords out of the way.  Do not use floor polish or wax that makes floors slippery. If you must use wax, use non-skid floor wax.  Do not have throw rugs and other things on the floor that can make you trip. What can I do with my stairs?  Do not leave any  items on the stairs.  Make sure that there are handrails on both sides of the stairs and use them. Fix handrails that are broken or loose. Make sure that handrails are as long as the stairways.  Check any carpeting to make sure that it is firmly attached to the stairs. Fix any carpet that is loose or worn.  Avoid having throw rugs at the top or bottom of the stairs. If you do have throw rugs, attach them to the floor with carpet tape.  Make sure that you have a light switch at the top of the stairs and the bottom of the stairs. If you do not have them, ask someone to add them for you. What else can I do to help prevent falls?  Wear shoes that:  Do not have high heels.  Have rubber bottoms.  Are comfortable and fit you well.  Are closed at the toe. Do not wear sandals.  If you use a stepladder:  Make sure that it is fully opened. Do not climb a closed stepladder.  Make sure that both sides of the stepladder are locked into place.  Ask someone to  hold it for you, if possible.  Clearly mark and make sure that you can see:  Any grab bars or handrails.  First and last steps.  Where the edge of each step is.  Use tools that help you move around (mobility aids) if they are needed. These include:  Canes.  Walkers.  Scooters.  Crutches.  Turn on the lights when you go into a dark area. Replace any light bulbs as soon as they burn out.  Set up your furniture so you have a clear path. Avoid moving your furniture around.  If any of your floors are uneven, fix them.  If there are any pets around you, be aware of where they are.  Review your medicines with your doctor. Some medicines can make you feel dizzy. This can increase your chance of falling. Ask your doctor what other things that you can do to help prevent falls. This information is not intended to replace advice given to you by your health care provider. Make sure you discuss any questions you have with your health care provider. Document Released: 05/02/2009 Document Revised: 12/12/2015 Document Reviewed: 08/10/2014 Elsevier Interactive Patient Education  2017 [employer].

## 2017-06-25 NOTE — Progress Notes (Signed)
Subjective:   Russell [name] is a 83 y.o. male who presents for an Initial Medicare Annual Wellness Visit at [facility]      Objective:    Today's Vitals   06/25/17 1105  BP: 132/72  Pulse: 66  Temp: 98.2 F (36.8 C)  TempSrc: Oral  SpO2: 98%  Weight: 148 lb (67.1 kg)  Height: 5\' 10"  (1.778 m)   Body mass index is 21.24 kg/m.  Advanced Directives 06/25/2017 01/25/2017 12/25/2016 09/29/2016 05/19/2016 10/29/2015 04/30/2015  Does Patient Have a Medical Advance Directive? Yes Yes Yes Yes Yes Yes Yes  Type of Paramedic of [place];Living will;Out of facility DNR (pink MOST or yellow form) [place];Out of facility DNR (pink MOST or yellow form) Healthcare Power of [place] of [place] of [place] of [place]  Does patient want to make changes to medical advance directive? No - Patient declined - - - - - No - Patient declined  Copy of [place] in Chart? Yes Yes - Yes Yes Yes Yes  Pre-existing out of facility DNR order (yellow form or pink MOST form) Yellow form placed in chart (order not valid for inpatient use);Pink MOST form placed in chart (order not valid for inpatient use) Yellow form placed in chart (order not valid for inpatient use) - - - - -    Current Medications (verified) Outpatient Encounter Medications as of 06/25/2017  Medication Sig  . aspirin 325 MG tablet Take 325 mg by mouth every evening.  . DiphenhydrAMINE HCl (ALLERGY MEDICATION PO) Take 1 tablet by mouth daily.  . dorzolamide (TRUSOPT) 2 % ophthalmic solution Place 1 drop into both eyes 2 (two) times daily.  . enalapril (VASOTEC) 20 MG tablet Take one tablet by mouth twice daily to control blood pressure  . lovastatin (MEVACOR) 20 MG tablet Take 1 tablet (20 mg total) by mouth at bedtime.  . metroNIDAZOLE (METROGEL) 0.75 % gel Apply 1 application  topically as needed (For roscea).   . Multiple Vitamins-Minerals (PRESERVISION AREDS 2 PO) Take 1 tablet by mouth 2 (two) times daily.  . niacin (SLO-NIACIN) 500 MG tablet Take 500 mg by mouth at bedtime.  . Omega-3 Fatty Acids (FISH OIL) 1000 MG CAPS Take 1,000 mg by mouth.  . Probiotic Product (PROBIOTIC-10 PO) Take 1 tablet by mouth daily.   . timolol (BETIMOL) 0.5 % ophthalmic solution Place 1 drop into both eyes 2 (two) times daily.  . Travoprost, BAK Free, (TRAVATAN Z) 0.004 % SOLN ophthalmic solution 1 drop. One drop left eye once a day for glaucoma   No facility-administered encounter medications on file as of 06/25/2017.     Allergies (verified) Patient has no known allergies.   History: Past Medical History:  Diagnosis Date  . Actinic keratosis 10/18/2012  . 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   . Atrophy, Fuchs' 12/10/2011  . 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 Cervicalgia 04/25/2013  . 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 Cyclitis, chronic 11/18/2012  . Deafness 10/18/2012  . Endothelial corneal dystrophy   . Essential hypertension 10/18/2012  . Hammer toe 04/24/2014  . Hyperlipidemia 10/18/2012  . Hypertension   . Intermediate stage nonexudative age-related macular degeneration of both eyes 04/16/2016  . Nocturia 05/19/2016  . Occlusion and stenosis of carotid artery with cerebral infarction   . Other and unspecified hyperlipidemia   .  Other specified cardiac dysrhythmias(427.89)   . Pain in joint, pelvic region and thigh   . Paresthesia 04/30/2015   Toes of both feet   . Peripheral vascular disease, unspecified ([place])   . Primary open angle glaucoma 09/25/2011  . 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  . Rosacea   . Seborrheic keratosis 04/24/2014  . Syncope 06/27/2013  . TIA (transient ischemic attack) 10/18/2012   States he has had about 5  times  . Unspecified glaucoma(365.9)   . Urine frequency    Past Surgical  History:  Procedure Laterality Date  . CATARACT EXTRACTION  2005   bilateral  . CORNEAL TRANSPLANT  2010 left; 2013 right eye  . HERNIA REPAIR  1985   Left  . HERNIA REPAIR  03/2004   Right  . SHOULDER DEBRIDEMENT Left 2005  . TONSILLECTOMY Bilateral childhood  . TRANSURETHRAL RESECTION OF PROSTATE N/A 10/2003  . VASECTOMY     Family History  Problem Relation Age of Onset  . Cancer Mother   . Heart disease Father    Social History   Socioeconomic History  . Marital status: Widowed    Spouse name: None  . Number of children: None  . Years of education: None  . Highest education level: None  Social Needs  . Financial resource strain: Not hard at all  . Food insecurity - worry: Never true  . Food insecurity - inability: Never true  . Transportation needs - medical: No  . Transportation needs - non-medical: No  Occupational History  . Occupation: retired Insurance claims handler  Tobacco Use  . Smoking status: Former Smoker    Packs/day: 1.00    Years: 25.00    Pack years: 25.00    Last attempt to quit: 10/18/1964    Years since quitting: 52.7  . Smokeless tobacco: Never Used  . Tobacco comment: quit 1966  Substance and Sexual Activity  . Alcohol use: No    Alcohol/week: 0.6 oz    Types: 1 Glasses of wine per week    Frequency: Never  . Drug use: No  . Sexual activity: None  Other Topics Concern  . None  Social History Narrative   L[facility] at [facility] since 08/21/2011   Married to Elmore Will   Exercise: 3 x week bicycle   Previously smoked stopped 1966   Does not drink cafferinated beverage   Drinks one small glass of wine few nights a week    Tobacco Counseling Counseling given: Not Answered Comment: quit 1966   Clinical Intake:  Pre-visit preparation completed: No  Pain : No/denies pain        Ambulation: Independent with device- listed below Home Assistive Devices/Equipment: Eyeglasses, Dentures (specify type), Communication device  (specify type)(full dentures, bilateal hearing aids)                 Activities of Daily Living In your present state of health, do you have any difficulty performing the following activities: 06/25/2017  Hearing? Y  Vision? N  Difficulty concentrating or making decisions? N  Walking or climbing stairs? N  Dressing or bathing? N  Doing errands, shopping? N  Preparing Food and eating ? N  Using the Toilet? N  In the past six months, have you accidently leaked urine? N  Do you have problems with loss of bowel control? N  Managing your Medications? N  Managing your Finances? N  Housekeeping or managing your Housekeeping? N  Some recent data might be hidden  Timed Get Up and Go performed: 13 seconds, within normal limits  Immunizations and Health Maintenance Immunization History  Administered Date(s) Administered  . Influenza Whole 04/19/2012, 04/20/2013  . Influenza-Unspecified 05/03/2014, 04/18/2015, 04/30/2016, 04/18/2017  . Pneumococcal Conjugate-13 03/20/2010  . Tdap 06/27/2011   Health Maintenance Due  Topic Date Due  . PNA vac Low Risk Adult (2 of 2 - PPSV23) 03/21/2011    Patient Care Team: Blanchie Serve, MD as PCP - General (Internal Medicine) [place], [facility] Crista Luria, MD as Consulting Physician (Dermatology) Marilynne Halsted, MD as Referring Physician (Ophthalmology) Bond, Tracie Harrier, MD as Referring Physician (Ophthalmology)  Indicate any recent Medical Services you may have received from other than [facility] providers in the past year (date may be approximate).    Assessment:   This is a routine wellness examination for Leondre.   Hearing/Vision screen Hearing Screening Comments: Wearing hearing aids Vision Screening Comments: Goes to [facility] opthamologist 3 times a year  Dietary issues and exercise activities discussed: Current Exercise Habits: Home exercise routine, Type of exercise: Other - see comments(biking), Time (Minutes): 20,  Frequency (Times/Week): 5, Weekly Exercise (Minutes/Week): 100, Intensity: Mild, Exercise limited by: None identified  Goals    . Patient Stated     Patient will work on Education officer, community.      Depression Screen PHQ 2/9 Scores 06/25/2017 05/03/2017 04/05/2017 01/25/2017  PHQ - 2 Score 0 0 5 0  PHQ- 9 Score - 0 9 -    Fall Risk Fall Risk  06/25/2017 01/25/2017 12/25/2016 05/19/2016 10/29/2015  Falls in the past year? No No No No No    Is the patient's home free of loose throw rugs in walkways, pet beds, electrical cords, etc?   yes      Grab bars in the bathroom? yes      Handrails on the stairs?   yes      Adequate lighting?   yes  Cognitive Function: MMSE - Mini Mental State Exam 05/03/2017 05/19/2016 10/30/2014 10/18/2012  Not completed: (No Data) - - -  Orientation to time 5 5 5 5   Orientation to Place 5 5 5 5   Registration 3 3 3 3   Attention/ Calculation 5 5 5 5   Recall 3 3 3 3   Language- name 2 objects 2 2 2 2   Language- repeat 1 1 1 1   Language- follow 3 step command 3 3 3 3   Language- read & follow direction 1 1 1 1   Write a sentence 1 1 1 1   Copy design 1 1 1 1   Total score 30 30 30 30         Screening Tests Health Maintenance  Topic Date Due  . PNA vac Low Risk Adult (2 of 2 - PPSV23) 03/21/2011  . TETANUS/TDAP  06/26/2021  . INFLUENZA VACCINE  Completed   Cancer Screenings: Lung:  Low Dose CT Chest recommended if Age 84-80 years, 30 pack-year currently smoking OR have quit w/in 15years. Patient does not qualify. Breast:  Up to date on Mammogram? Yes  Up to date of Bone Density/Dexa? Yes Colorectal: up to date  Additional Screenings:  Hepatitis B/HIV/Syphillis:Declined Hepatitis C Screening: Declined      Plan:    I have personally reviewed and addressed the Medicare Annual Wellness questionnaire and have noted the following in the patient's chart:  A. Medical and social history B. Use of alcohol, tobacco or illicit drugs  C. Current medications and  supplements D. Functional ability and status E.  Nutritional  status F.  Physical activity G. Advance directives H. List of other physicians I.  Hospitalizations, surgeries, and ER visits in previous 12 months J.  [place] to include hearing, vision, cognitive, depression L. Referrals and appointments - none  In addition, I have reviewed and discussed with patient certain preventive protocols, quality metrics, and best practice recommendations. A written personalized care plan for preventive services as well as general preventive health recommendations were provided to patient.  See attached scanned questionnaire for additional information.   Signed,   Rich Reining, RN Nurse Health Advisor   Quick Notes   Health Maintenance: On waiting list for Shingrix. Sent prescription for PNA 23     Abnormal Screen: MMSE 30/30 on 05/03/2017     Patient Concerns: Bp has been unconsistent and is going to call to make dr appointment to discuss     Nurse Concerns: None

## 2017-07-05 ENCOUNTER — Non-Acute Institutional Stay: Payer: Medicare Other | Admitting: Internal Medicine

## 2017-07-05 ENCOUNTER — Encounter: Payer: Self-pay | Admitting: Internal Medicine

## 2017-07-05 VITALS — BP 144/78 | HR 64 | Temp 97.6°F | Resp 18 | Ht 70.0 in | Wt 148.8 lb

## 2017-07-05 DIAGNOSIS — Z8673 Personal history of transient ischemic attack (TIA), and cerebral infarction without residual deficits: Secondary | ICD-10-CM

## 2017-07-05 DIAGNOSIS — F4321 Adjustment disorder with depressed mood: Secondary | ICD-10-CM | POA: Diagnosis not present

## 2017-07-05 DIAGNOSIS — I10 Essential (primary) hypertension: Secondary | ICD-10-CM | POA: Diagnosis not present

## 2017-07-05 NOTE — Progress Notes (Signed)
[facility]  Provider: Blanchie Serve MD   Location:  [place] of Service:  Clinic (12)  PCP: Blanchie Serve, MD Patient Care Team: Blanchie Serve, MD as PCP - General (Internal Medicine) [place], [facility] Crista Luria, MD as Consulting Physician (Dermatology) Marilynne Halsted, MD as Referring Physician (Ophthalmology) Bond, Tracie Harrier, MD as Referring Physician (Ophthalmology)  Extended Emergency Contact Information Primary Emergency Contact: Gwyndolyn Saxon Address: [address], [place] [address] [place] Phone: [phone] Relation: Spouse Secondary Emergency Contact: Tawny Hopping [place] of [place] Phone: [phone] Relation: Son  Code Status: DNR  Goals of Care: Advanced Directive information Advanced Directives 06/25/2017  Does Patient Have a Medical Advance Directive? Yes  Type of Paramedic of [place];Living will;Out of facility DNR (pink MOST or yellow form)  Does patient want to make changes to medical advance directive? No - Patient declined  Copy of [place] in Chart? Yes  Pre-existing out of facility DNR order (yellow form or pink MOST form) Yellow form placed in chart (order not valid for inpatient use);Pink MOST form placed in chart (order not valid for inpatient use)      Chief Complaint  Patient presents with  . Acute Visit    Patient has some concerns about his elevated blood pressure readings. Patient brought his blood pressure readings with him today.     HPI: Patient is a 83 y.o. male seen today for acute visit. He is here for follow up on his BP visit. Reviewed home reading 169/83, 152/75, 139/70, 158/72, 189/82. Denies any headache, chest pain or shortness of breath. Denies any abdominal pain. Lost his wife recently. Mood stable. PHQ 2 screen score is 0. At office, BP  stable.   Past Medical History:  Diagnosis Date  . Actinic keratosis 10/18/2012  . 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   . Atrophy, Fuchs' 12/10/2011  . 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 Cervicalgia 04/25/2013  . 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 Cyclitis, chronic 11/18/2012  . Deafness 10/18/2012  . Endothelial corneal dystrophy   . Essential hypertension 10/18/2012  . Hammer toe 04/24/2014  . Hyperlipidemia 10/18/2012  . Hypertension   . Intermediate stage nonexudative age-related macular degeneration of both eyes 04/16/2016  . Nocturia 05/19/2016  . Occlusion and stenosis of carotid artery with cerebral infarction   . Other and unspecified hyperlipidemia   . Other specified cardiac dysrhythmias(427.89)   . Pain in joint, pelvic region and thigh   . Paresthesia 04/30/2015   Toes of both feet   . Peripheral vascular disease, unspecified ([place])   . Primary open angle glaucoma 09/25/2011  . 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  . Rosacea   . Seborrheic keratosis 04/24/2014  . Syncope 06/27/2013  . TIA (transient ischemic attack) 10/18/2012   States he has had about 5  times  . Unspecified glaucoma(365.9)   . Urine frequency    Past Surgical History:  Procedure Laterality Date  . CATARACT EXTRACTION  2005   bilateral  . CORNEAL TRANSPLANT  2010 left; 2013 right eye  . HERNIA REPAIR  1985   Left  . HERNIA REPAIR  03/2004   Right  . SHOULDER DEBRIDEMENT Left 2005  . TONSILLECTOMY Bilateral childhood  . TRANSURETHRAL  RESECTION OF PROSTATE N/A 10/2003  . VASECTOMY      reports that he quit smoking about 52 years ago. He has a 25.00 pack-year smoking history. he has never used smokeless tobacco. He reports that he does not drink alcohol or use drugs. Social History   Socioeconomic History  . Marital status: Widowed    Spouse name: Not on file  . Number of children: Not on file  . Years of education: Not on file  . Highest  education level: Not on file  Social Needs  . Financial resource strain: Not hard at all  . Food insecurity - worry: Never true  . Food insecurity - inability: Never true  . Transportation needs - medical: No  . Transportation needs - non-medical: No  Occupational History  . Occupation: retired Insurance claims handler  Tobacco Use  . Smoking status: Former Smoker    Packs/day: 1.00    Years: 25.00    Pack years: 25.00    Last attempt to quit: 10/18/1964    Years since quitting: 52.7  . Smokeless tobacco: Never Used  . Tobacco comment: quit 1966  Substance and Sexual Activity  . Alcohol use: No    Alcohol/week: 0.6 oz    Types: 1 Glasses of wine per week    Frequency: Never  . Drug use: No  . Sexual activity: Not on file  Other Topics Concern  . Not on file  Social History Narrative   [facility] since 08/21/2011   Married to [place] Will   Exercise: 3 x week bicycle   Previously smoked stopped 1966   Does not drink cafferinated beverage   Drinks one small glass of wine few nights a week     Functional Status Survey:    Family History  Problem Relation Age of Onset  . Cancer Mother   . Heart disease Father     Health Maintenance  Topic Date Due  . PNA vac Low Risk Adult (2 of 2 - PPSV23) 03/21/2011  . TETANUS/TDAP  06/26/2021  . INFLUENZA VACCINE  Completed    No Known Allergies  Outpatient Encounter Medications as of 07/05/2017  Medication Sig  . aspirin 325 MG tablet Take 325 mg by mouth every evening.  . dorzolamide (TRUSOPT) 2 % ophthalmic solution Place 1 drop into both eyes 2 (two) times daily.  . enalapril (VASOTEC) 20 MG tablet Take one tablet by mouth twice daily to control blood pressure  . lovastatin (MEVACOR) 20 MG tablet Take 1 tablet (20 mg total) by mouth at bedtime.  . Multiple Vitamins-Minerals (PRESERVISION AREDS 2 PO) Take 1 tablet by mouth 2 (two) times daily.  . niacin (SLO-NIACIN) 500 MG tablet Take 500 mg by mouth at  bedtime.  . Omega-3 Fatty Acids (FISH OIL) 1000 MG CAPS Take 1,000 mg by mouth.  . Probiotic Product (PROBIOTIC-10 PO) Take 1 tablet by mouth daily.   . timolol (BETIMOL) 0.5 % ophthalmic solution Place 1 drop into both eyes 2 (two) times daily.  . Travoprost, BAK Free, (TRAVATAN Z) 0.004 % SOLN ophthalmic solution 1 drop. One drop left eye once a day for glaucoma  . DiphenhydrAMINE HCl (ALLERGY MEDICATION PO) Take 1 tablet by mouth daily.  . metroNIDAZOLE (METROGEL) 0.75 % gel Apply 1 application topically as needed (For roscea).    No facility-administered encounter medications on file as of 07/05/2017.     Review of Systems  Constitutional: Negative for appetite change and  fever.  HENT: Positive for hearing loss and rhinorrhea. Negative for congestion, sore throat and trouble swallowing.   Eyes:       Denies new visual disturbance  Respiratory: Negative for cough and shortness of breath.   Cardiovascular: Negative for chest pain, palpitations and leg swelling.  Gastrointestinal: Negative for abdominal pain, nausea and vomiting.  Musculoskeletal:       No fall reported  Skin: Negative for rash.  Neurological: Negative for dizziness, tremors, numbness and headaches.  Psychiatric/Behavioral: Negative for behavioral problems and confusion.    Vitals:   07/05/17 1533  BP: (!) 144/78  Pulse: 64  Resp: 18  Temp: 97.6 F (36.4 C)  TempSrc: Oral  SpO2: 96%  Weight: 148 lb 12.8 oz (67.5 kg)  Height: 5\' 10"  (1.778 m)   Body mass index is 21.35 kg/m.   Wt Readings from Last 3 Encounters:  07/05/17 148 lb 12.8 oz (67.5 kg)  06/25/17 148 lb (67.1 kg)  05/04/17 148 lb (67.1 kg)   Physical Exam  Constitutional: He is oriented to person, place, and time. He appears well-developed and well-nourished. No distress.  HENT:  Head: Normocephalic and atraumatic.  Mouth/Throat: Oropharynx is clear and moist.  Eyes: Conjunctivae and EOM are normal.  Neck: Normal range of motion. Neck  supple.  Cardiovascular: Normal rate and regular rhythm.  Pulmonary/Chest: Effort normal and breath sounds normal. He has no wheezes.  Abdominal: Soft. Bowel sounds are normal.  Musculoskeletal: Normal range of motion. He exhibits no edema.  Neurological: He is alert and oriented to person, place, and time.  Skin: Skin is warm and dry. He is not diaphoretic.    Labs reviewed: Basic Metabolic Panel: Recent Labs    04/26/17 0735  NA 141  K 3.8  CL 105  CO2 28  GLUCOSE 85  BUN 16  CREATININE 0.90  CALCIUM 8.6   Liver Function Tests: Recent Labs    04/26/17 0735  AST 21  ALT 17  BILITOT 0.9  PROT 5.8*   No results for input(s): LIPASE, AMYLASE in the last 8760 hours. No results for input(s): AMMONIA in the last 8760 hours. CBC: Recent Labs    04/26/17 0735  WBC 6.8  HGB 15.2  HCT 44.3  MCV 87.4  PLT 162   Cardiac Enzymes: No results for input(s): CKTOTAL, CKMB, CKMBINDEX, TROPONINI in the last 8760 hours. BNP: Invalid input(s): POCBNP No results found for: HGBA1C Lab Results  Component Value Date   TSH 2.52 04/26/2017   No results found for: VITAMINB12 No results found for: FOLATE No results found for: IRON, TIBC, FERRITIN  Lipid Panel: Recent Labs    12/22/16 0822  CHOL 187  HDL 45  LDLCALC 126*  TRIG 78  CHOLHDL 4.2   No results found for: HGBA1C  Procedures since last visit: No results found.  Assessment/Plan   1. Essential hypertension Pt went back to apartment to get his BP machine. After resting, BP from home machine 155/83 and BP from office machine 138/74. Symptom free. Continue current regimen of enalapril 20 mg bid and monitor. Warning symptoms with elevated BP explained and pt to have nurses check his BP if has warning symptom. Pt voices understanding this.   2. History of TIA (transient ischemic attack) C/w aspirin and statin. Controlled BP.   3. Grief Lost his wife recently, coping fair. PHQ-2 score 0. Mood stable.  monitor   Labs/tests ordered:  Has future lab orders  Next appointment: 4 month (has f/u)  Blanchie Serve, MD Internal Medicine [facility] [address] [place], [place] [address] Cell Phone (Monday-Friday 8 am - 5 pm): [phone] On Call: [phone] and follow prompts after 5 pm and on weekends Office Phone: [phone] Office Fax: [phone]

## 2017-07-15 DIAGNOSIS — M25511 Pain in right shoulder: Secondary | ICD-10-CM | POA: Diagnosis not present

## 2017-07-16 DIAGNOSIS — M25511 Pain in right shoulder: Secondary | ICD-10-CM | POA: Diagnosis not present

## 2017-07-22 DIAGNOSIS — M25511 Pain in right shoulder: Secondary | ICD-10-CM | POA: Diagnosis not present

## 2017-07-23 DIAGNOSIS — M25511 Pain in right shoulder: Secondary | ICD-10-CM | POA: Diagnosis not present

## 2017-07-28 DIAGNOSIS — M25511 Pain in right shoulder: Secondary | ICD-10-CM | POA: Diagnosis not present

## 2017-07-29 DIAGNOSIS — M25511 Pain in right shoulder: Secondary | ICD-10-CM | POA: Diagnosis not present

## 2017-07-30 DIAGNOSIS — M25511 Pain in right shoulder: Secondary | ICD-10-CM | POA: Diagnosis not present

## 2017-08-05 DIAGNOSIS — M25511 Pain in right shoulder: Secondary | ICD-10-CM | POA: Diagnosis not present

## 2017-08-06 DIAGNOSIS — M25511 Pain in right shoulder: Secondary | ICD-10-CM | POA: Diagnosis not present

## 2017-08-11 DIAGNOSIS — M25511 Pain in right shoulder: Secondary | ICD-10-CM | POA: Diagnosis not present

## 2017-08-12 DIAGNOSIS — M25511 Pain in right shoulder: Secondary | ICD-10-CM | POA: Diagnosis not present

## 2017-08-13 DIAGNOSIS — M25511 Pain in right shoulder: Secondary | ICD-10-CM | POA: Diagnosis not present

## 2017-08-17 ENCOUNTER — Encounter: Payer: Medicare Other | Admitting: Family

## 2017-08-18 DIAGNOSIS — M25511 Pain in right shoulder: Secondary | ICD-10-CM | POA: Diagnosis not present

## 2017-08-19 DIAGNOSIS — M25511 Pain in right shoulder: Secondary | ICD-10-CM | POA: Diagnosis not present

## 2017-08-20 ENCOUNTER — Encounter: Payer: Self-pay | Admitting: Family

## 2017-08-20 ENCOUNTER — Non-Acute Institutional Stay: Payer: Medicare Other | Admitting: Family

## 2017-08-20 VITALS — BP 140/70 | HR 60 | Temp 97.6°F | Resp 16 | Ht 70.0 in | Wt 153.0 lb

## 2017-08-20 DIAGNOSIS — R42 Dizziness and giddiness: Secondary | ICD-10-CM

## 2017-08-20 DIAGNOSIS — M25511 Pain in right shoulder: Secondary | ICD-10-CM | POA: Diagnosis not present

## 2017-08-20 DIAGNOSIS — L853 Xerosis cutis: Secondary | ICD-10-CM | POA: Diagnosis not present

## 2017-08-20 MED ORDER — AQUAPHOR EX OINT: TOPICAL_OINTMENT | Freq: Two times a day (BID) | CUTANEOUS | 0 refills | Status: DC

## 2017-08-20 NOTE — Progress Notes (Signed)
Location:  [place] of Service:  Clinic (12) Provider: Dinah Ngetich FNP-C  Blanchie Serve, MD  Patient Care Team: Blanchie Serve, MD as PCP - General (Internal Medicine) [place], [facility] Crista Luria, MD as Consulting Physician (Dermatology) Marilynne Halsted, MD as Referring Physician (Ophthalmology) Bond, Tracie Harrier, MD as Referring Physician (Ophthalmology)  Extended Emergency Contact Information Primary Emergency Contact: Gwyndolyn Saxon Address: [address], [place] [address] [place] Phone: [phone] Relation: Spouse Secondary Emergency Contact: Tawny Hopping [place] of [place] Phone: [phone] Relation: Son  Code Status:  DNR Goals of care: Advanced Directive information Advanced Directives 08/20/2017  Does Patient Have a Medical Advance Directive? Yes  Type of Advance Directive Out of facility DNR (pink MOST or yellow form);Healthcare Power of Attorney  Does patient want to make changes to medical advance directive? -  Copy of [place] in Chart? Yes  Pre-existing out of facility DNR order (yellow form or pink MOST form) Yellow form placed in chart (order not valid for inpatient use)     Chief Complaint  Patient presents with  . Acute Visit    dizziness    HPI:  Pt is a 84 y.o. male seen today at [facility] clinic for an acute visit for evaluation of dizziness.He states he had an episode of dizziness one week ago after getting up at night to use the bathroom.He had to sit at side of the bed for a few minutes before he could get up.He also noticed dizziness early in the morning. He states symptoms have not occurred since one week ago but he would like to be checked because he will be going to visit with the sister in law for the next two weeks and does not want anything to happen while he is away. He denies any  headache,nausea,vomiting,fever,chills or cough. He states eating well. He does not check his blood pressure prior to talking blood pressure but he is aware to get his blood pressure checked at the ALF.    Past Medical History:  Diagnosis Date  . Actinic keratosis 10/18/2012  . 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   . Atrophy, Fuchs' 12/10/2011  . 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 Cervicalgia 04/25/2013  . 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 Cyclitis, chronic 11/18/2012  . Deafness 10/18/2012  . Endothelial corneal dystrophy   . Essential hypertension 10/18/2012  . Hammer toe 04/24/2014  . Hyperlipidemia 10/18/2012  . Hypertension   . Intermediate stage nonexudative age-related macular degeneration of both eyes 04/16/2016  . Nocturia 05/19/2016  . Occlusion and stenosis of carotid artery with cerebral infarction   . Other and unspecified hyperlipidemia   . Other specified cardiac dysrhythmias(427.89)   . Pain in joint, pelvic region and thigh   . Paresthesia 04/30/2015   Toes of both feet   . Peripheral vascular disease, unspecified ([place])   . Primary open angle glaucoma 09/25/2011  . 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  . Rosacea   . Seborrheic keratosis 04/24/2014  . Syncope 06/27/2013  . TIA (transient ischemic attack) 10/18/2012   States he has had about 5  times  . Unspecified glaucoma(365.9)   . Urine frequency    Past Surgical History:  Procedure Laterality Date  . CATARACT EXTRACTION  2005  bilateral  . CORNEAL TRANSPLANT  2010 left; 2013 right eye  . HERNIA REPAIR  1985   Left  . HERNIA REPAIR  03/2004   Right  . SHOULDER DEBRIDEMENT Left 2005  . TONSILLECTOMY Bilateral childhood  . TRANSURETHRAL RESECTION OF PROSTATE N/A 10/2003  . VASECTOMY      No Known Allergies  Outpatient Encounter Medications as of 08/20/2017  Medication Sig  . aspirin 325 MG tablet Take 325 mg by mouth every evening.  . DiphenhydrAMINE HCl  (ALLERGY MEDICATION PO) Take 1 tablet by mouth daily.  . dorzolamide (TRUSOPT) 2 % ophthalmic solution Place 1 drop into both eyes 2 (two) times daily.  . enalapril (VASOTEC) 20 MG tablet Take one tablet by mouth twice daily to control blood pressure  . lovastatin (MEVACOR) 20 MG tablet Take 1 tablet (20 mg total) by mouth at bedtime.  . niacin (SLO-NIACIN) 500 MG tablet Take 500 mg by mouth at bedtime.  . Omega-3 Fatty Acids (FISH OIL) 1000 MG CAPS Take 1,000 mg by mouth.  . Probiotic Product (PROBIOTIC-10 PO) Take 1 tablet by mouth daily.   . timolol (BETIMOL) 0.5 % ophthalmic solution Place 1 drop into both eyes 2 (two) times daily.  . Travoprost, BAK Free, (TRAVATAN Z) 0.004 % SOLN ophthalmic solution 1 drop. One drop left eye once a day for glaucoma  . mineral oil-hydrophilic petrolatum (AQUAPHOR) ointment Apply topically 2 (two) times daily. Apply to both legs for dryness  . [DISCONTINUED] metroNIDAZOLE (METROGEL) 0.75 % gel Apply 1 application topically as needed (For roscea).   . [DISCONTINUED] Multiple Vitamins-Minerals (PRESERVISION AREDS 2 PO) Take 1 tablet by mouth 2 (two) times daily.   No facility-administered encounter medications on file as of 08/20/2017.     Review of Systems  Constitutional: Negative for appetite change, chills, fatigue and fever.  HENT: Positive for hearing loss. Negative for congestion, rhinorrhea, sinus pressure, sinus pain, sneezing and sore throat.   Eyes: Positive for visual disturbance. Negative for pain, discharge, redness and itching.       Has hx glaucoma,macular degeneration following up with ophthalmology.    Respiratory: Negative for cough, chest tightness, shortness of breath and wheezing.   Cardiovascular: Negative for chest pain, palpitations and leg swelling.  Gastrointestinal: Negative for abdominal distention, abdominal pain, constipation, diarrhea, nausea and vomiting.  Skin: Negative for color change, pallor and rash.  Neurological:  Negative for syncope, light-headedness and headaches.       Dizziness per HPI   Psychiatric/Behavioral: Negative for agitation and sleep disturbance. The patient is not nervous/anxious.     Immunization History  Administered Date(s) Administered  . Influenza Whole 04/19/2012, 04/20/2013  . Influenza-Unspecified 05/03/2014, 04/18/2015, 04/30/2016, 04/18/2017  . Pneumococcal Conjugate-13 03/20/2010  . Tdap 06/27/2011   Pertinent  Health Maintenance Due  Topic Date Due  . PNA vac Low Risk Adult (2 of 2 - PPSV23) 03/21/2011  . INFLUENZA VACCINE  Completed   Fall Risk  06/25/2017 01/25/2017 12/25/2016 05/19/2016 10/29/2015  Falls in the past year? No No No No No      Vitals:   08/20/17 1050 08/20/17 1058  BP: 138/78 140/70  Pulse: 72 60  Resp:  16  Temp:  97.6 F (36.4 C)  SpO2:  99%  Weight:  153 lb (69.4 kg)  Height:  '5\' 10"'  (1.778 m)   Body mass index is 21.95 kg/m. Physical Exam  Constitutional: He is oriented to person, place, and time. He appears well-developed and well-nourished.  Elderly in no  acute distress   HENT:  Head: Normocephalic.  Right Ear: External ear normal.  Left Ear: External ear normal.  Mouth/Throat: Oropharynx is clear and moist. No oropharyngeal exudate.  Eyes: Conjunctivae and EOM are normal. Right eye exhibits no discharge. Left eye exhibits no discharge. No scleral icterus.  pupil reactive left pupil larger than right.   Neck: Normal range of motion. No JVD present. No thyromegaly present.  Cardiovascular: Normal rate, regular rhythm, normal heart sounds and intact distal pulses. Exam reveals no gallop and no friction rub.  No murmur heard. Pulmonary/Chest: Effort normal and breath sounds normal. No respiratory distress. He has no wheezes. He has no rales.  Abdominal: Soft. Bowel sounds are normal. He exhibits no distension. There is no tenderness. There is no rebound and no guarding.  Musculoskeletal: Normal range of motion. He exhibits no edema or  tenderness.  Lymphadenopathy:    He has no cervical adenopathy.  Neurological: He is oriented to person, place, and time. Coordination normal.  Skin: Skin is warm and dry. No rash noted. No erythema.  Bilateral lower extremities dry and scaly   Psychiatric: He has a normal mood and affect.   Labs reviewed: Recent Labs    04/26/17 0735  NA 141  K 3.8  CL 105  CO2 28  GLUCOSE 85  BUN 16  CREATININE 0.90  CALCIUM 8.6   Recent Labs    04/26/17 0735  AST 21  ALT 17  BILITOT 0.9  PROT 5.8*   Recent Labs    04/26/17 0735  WBC 6.8  HGB 15.2  HCT 44.3  MCV 87.4  PLT 162   Lab Results  Component Value Date   TSH 2.52 04/26/2017   No results found for: HGBA1C Lab Results  Component Value Date   CHOL 187 12/22/2016   HDL 45 12/22/2016   LDLCALC 126 (H) 12/22/2016   TRIG 78 12/22/2016   CHOLHDL 4.2 12/22/2016    Significant Diagnostic Results in last 30 days:  No results found.  Assessment/Plan 1. Dizziness Has symptoms one week ago worst when getting out of bed.No symptoms reported since then.AfebrileOrthostatic blood pressure within range.will obtain lab work to rule out any infectious or metabolic causes. - CBC with Differential/Platelets 08/23/2017 - CMP with eGFR([facility]) 08/23/2017  2. Dry skin dermatitis  bilateral lower extremities skin dry and flaky. Encouraged to drink 6-8 glasses of water daily to keep skin moist.Apply Aquaphor ointment to both legs at least twice daily.   Family/ staff Communication: Reviewed plan of care with patient and facility Nurse.   Labs/tests ordered: None   Dinah C Ngetich, NP

## 2017-08-20 NOTE — Patient Instructions (Signed)
-   Drink 6-8 glasses of water daily - Apply Aquaphor ointment to both legs twice daily for dry skin - Notify provider if dizziness not resolved or worsen.

## 2017-08-23 ENCOUNTER — Other Ambulatory Visit: Payer: Self-pay

## 2017-08-23 DIAGNOSIS — R42 Dizziness and giddiness: Secondary | ICD-10-CM | POA: Diagnosis not present

## 2017-08-23 LAB — CBC WITH DIFFERENTIAL/PLATELET
Basophils Absolute: 63 cells/uL (ref 0–200)
Basophils Relative: 0.9 %
Eosinophils Absolute: 259 cells/uL (ref 15–500)
Eosinophils Relative: 3.7 %
HCT: 43.2 % (ref 38.5–50.0)
Hemoglobin: 15.2 g/dL (ref 13.2–17.1)
Lymphs Abs: 1568 cells/uL (ref 850–3900)
MCH: 30.4 pg (ref 27.0–33.0)
MCHC: 35.2 g/dL (ref 32.0–36.0)
MCV: 86.4 fL (ref 80.0–100.0)
MONOS PCT: 9.8 %
MPV: 9.7 fL (ref 7.5–12.5)
NEUTROS PCT: 63.2 %
Neutro Abs: 4424 cells/uL (ref 1500–7800)
Platelets: 176 10*3/uL (ref 140–400)
RBC: 5 10*6/uL (ref 4.20–5.80)
RDW: 13.1 % (ref 11.0–15.0)
Total Lymphocyte: 22.4 %
WBC mixed population: 686 cells/uL (ref 200–950)
WBC: 7 10*3/uL (ref 3.8–10.8)

## 2017-08-23 LAB — COMPLETE METABOLIC PANEL WITH GFR
AG RATIO: 2 (calc) (ref 1.0–2.5)
ALBUMIN MSPROF: 3.9 g/dL (ref 3.6–5.1)
ALT: 15 U/L (ref 9–46)
AST: 22 U/L (ref 10–35)
Alkaline phosphatase (APISO): 53 U/L (ref 40–115)
BILIRUBIN TOTAL: 0.7 mg/dL (ref 0.2–1.2)
BUN: 18 mg/dL (ref 7–25)
CHLORIDE: 104 mmol/L (ref 98–110)
CO2: 29 mmol/L (ref 20–32)
Calcium: 9.1 mg/dL (ref 8.6–10.3)
Creat: 0.89 mg/dL (ref 0.70–1.11)
GFR, EST AFRICAN AMERICAN: 88 mL/min/{1.73_m2} (ref >=60)
GFR, Est Non African American: 76 mL/min/{1.73_m2} (ref >=60)
Globulin: 2 g/dL (calc) (ref 1.9–3.7)
Glucose, Bld: 74 mg/dL (ref 65–99)
POTASSIUM: 4 mmol/L (ref 3.5–5.3)
Sodium: 139 mmol/L (ref 135–146)
Total Protein: 5.9 g/dL — ABNORMAL LOW (ref 6.1–8.1)

## 2017-08-25 DIAGNOSIS — M25511 Pain in right shoulder: Secondary | ICD-10-CM | POA: Diagnosis not present

## 2017-08-26 DIAGNOSIS — M25511 Pain in right shoulder: Secondary | ICD-10-CM | POA: Diagnosis not present

## 2017-08-27 DIAGNOSIS — H401131 Primary open-angle glaucoma, bilateral, mild stage: Secondary | ICD-10-CM | POA: Diagnosis not present

## 2017-10-15 DIAGNOSIS — B351 Tinea unguium: Secondary | ICD-10-CM | POA: Diagnosis not present

## 2017-10-15 DIAGNOSIS — M79672 Pain in left foot: Secondary | ICD-10-CM | POA: Diagnosis not present

## 2017-10-15 DIAGNOSIS — M79671 Pain in right foot: Secondary | ICD-10-CM | POA: Diagnosis not present

## 2017-10-18 DIAGNOSIS — H401131 Primary open-angle glaucoma, bilateral, mild stage: Secondary | ICD-10-CM | POA: Diagnosis not present

## 2017-10-24 ENCOUNTER — Other Ambulatory Visit: Payer: Self-pay | Admitting: Internal Medicine

## 2017-10-25 DIAGNOSIS — I10 Essential (primary) hypertension: Secondary | ICD-10-CM

## 2017-10-25 DIAGNOSIS — E785 Hyperlipidemia, unspecified: Secondary | ICD-10-CM

## 2017-10-25 LAB — LIPID PANEL
Cholesterol: 154 mg/dL (ref <200)
HDL: 43 mg/dL (ref >40)
LDL Cholesterol (Calc): 93 mg/dL (calc)
Non-HDL Cholesterol (Calc): 111 mg/dL (calc) (ref <130)
TRIGLYCERIDES: 87 mg/dL (ref <150)
Total CHOL/HDL Ratio: 3.6 (calc) (ref <5.0)

## 2017-10-25 LAB — BASIC METABOLIC PANEL WITH GFR
BUN: 19 mg/dL (ref 7–25)
CALCIUM: 8.7 mg/dL (ref 8.6–10.3)
CHLORIDE: 106 mmol/L (ref 98–110)
CO2: 31 mmol/L (ref 20–32)
Creat: 0.9 mg/dL (ref 0.70–1.11)
GFR, EST AFRICAN AMERICAN: 87 mL/min/{1.73_m2} (ref >=60)
GFR, Est Non African American: 75 mL/min/{1.73_m2} (ref >=60)
Glucose, Bld: 89 mg/dL (ref 65–99)
POTASSIUM: 4 mmol/L (ref 3.5–5.3)
SODIUM: 142 mmol/L (ref 135–146)

## 2017-11-01 ENCOUNTER — Non-Acute Institutional Stay: Payer: Medicare Other | Admitting: Internal Medicine

## 2017-11-01 ENCOUNTER — Encounter: Payer: Self-pay | Admitting: Internal Medicine

## 2017-11-01 VITALS — BP 132/64 | HR 62 | Temp 97.9°F | Resp 18 | Ht 70.0 in | Wt 156.2 lb

## 2017-11-01 DIAGNOSIS — H906 Mixed conductive and sensorineural hearing loss, bilateral: Secondary | ICD-10-CM

## 2017-11-01 DIAGNOSIS — Z7189 Other specified counseling: Secondary | ICD-10-CM | POA: Diagnosis not present

## 2017-11-01 DIAGNOSIS — I10 Essential (primary) hypertension: Secondary | ICD-10-CM | POA: Diagnosis not present

## 2017-11-01 DIAGNOSIS — E785 Hyperlipidemia, unspecified: Secondary | ICD-10-CM

## 2017-11-01 DIAGNOSIS — Z8673 Personal history of transient ischemic attack (TIA), and cerebral infarction without residual deficits: Secondary | ICD-10-CM | POA: Diagnosis not present

## 2017-11-01 DIAGNOSIS — H401131 Primary open-angle glaucoma, bilateral, mild stage: Secondary | ICD-10-CM

## 2017-11-01 NOTE — Patient Instructions (Signed)
  Stop taking probiotic, niacin and diphenhydramine for now.  Please provide a copy of your living will to our office.

## 2017-11-01 NOTE — ACP (Advance Care Planning) (Signed)
  Goals of care discussion Reviewed goals of care with patient today. He has a living will and HCPOA paperwork. copies of HCPOA present in chart. Have requested copy of living will. Patient has a DNR form filled out today. MOST form reviewed and filled out today. Patient would like to remain a DNR in absence of pulse or breath. Patient would like hospitalization with limited interventions if medical need arises. He agrees to iv fluids and antibiotic if indicated. Patient does not want a feeding tube for nutritional support. Form filled out and reviewed and signed. Patient would like to focus on quality of life. He does not want to be kept alive in vegetable state and he does not want a life where he is a burden to himself or others. I have spent time from 1:50 pm to 2:10 pm reviewing goals of care today. All questions from patient have been answered to the best of my knowledge.

## 2017-11-01 NOTE — Progress Notes (Signed)
[facility]  Provider: Blanchie Serve MD   Location:  [place] of Service:  Clinic (12)  PCP: Blanchie Serve, MD Patient Care Team: Blanchie Serve, MD as PCP - General (Internal Medicine) [place], [facility] Crista Luria, MD as Consulting Physician (Dermatology) Marilynne Halsted, MD as Referring Physician (Ophthalmology) Bond, Tracie Harrier, MD as Referring Physician (Ophthalmology)  Extended Emergency Contact Information Primary Emergency Contact: Gwyndolyn Saxon Address: [address] [place], [place] [address] [place] Phone: [phone] Relation: Spouse Secondary Emergency Contact: Tawny Hopping [place] of [place] Phone: [phone] Relation: Son  Code Status: DNR  Goals of Care: Advanced Directive information Advanced Directives 11/01/2017  Does Patient Have a Medical Advance Directive? Yes  Type of Paramedic of [place];Living will  Does patient want to make changes to medical advance directive? Yes (MAU/Ambulatory/Procedural Areas - Information given)  Copy of [place] in Chart? Yes  Pre-existing out of facility DNR order (yellow form or pink MOST form) -      Chief Complaint  Patient presents with  . Medical Management of Chronic Issues    6 month follow up  . Medication Refill    No refills needed at this time   . Results    Discuss labs.    HPI: Patient is a 84 y.o. male seen today for routine visit. He mentions having shingles vaccine at age 66 and is awaiting shingrix.   Allergic rhinitis- taking diphenhydramine daily. Unsure of the dose. Has runny nose with clear discharge and some nasal drip  Primary open angle glaucoma- followed at [facility] clinic, currently on travatan and cosopt eye drops along with timolol, IOP stable. Last seen by ophthalmology on 10/18/17, note reviewed.   Hypertension-  takes enalapril 20 mg bid, denies chest pain or dyspnea, denies headache or dizziness. No fall reported.  Hyperlipidemia- currently on lovastatin 20 mg daily, denies myalagia. Also on aspirin  Past Medical History:  Diagnosis Date  . Actinic keratosis 10/18/2012  . 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   . Atrophy, Fuchs' 12/10/2011  . 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 Cervicalgia 04/25/2013  . 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 Cyclitis, chronic 11/18/2012  . Deafness 10/18/2012  . Endothelial corneal dystrophy   . Essential hypertension 10/18/2012  . Hammer toe 04/24/2014  . Hyperlipidemia 10/18/2012  . Hypertension   . Intermediate stage nonexudative age-related macular degeneration of both eyes 04/16/2016  . Nocturia 05/19/2016  . Occlusion and stenosis of carotid artery with cerebral infarction   . Other and unspecified hyperlipidemia   . Other specified cardiac dysrhythmias(427.89)   . Pain in joint, pelvic region and thigh   . Paresthesia 04/30/2015   Toes of both feet   . Peripheral vascular disease, unspecified ([place])   . Primary open angle glaucoma 09/25/2011  . 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  . Rosacea   . Seborrheic keratosis 04/24/2014  . Syncope 06/27/2013  . TIA (transient ischemic attack) 10/18/2012   States he has had about 5  times  . Unspecified glaucoma(365.9)   . Urine frequency    Past Surgical History:  Procedure Laterality Date  . CATARACT EXTRACTION  2005   bilateral  . CORNEAL TRANSPLANT  2010 left; 2013 right eye  . HERNIA  REPAIR  1985   Left  . HERNIA REPAIR  03/2004   Right  . SHOULDER DEBRIDEMENT Left 2005  . TONSILLECTOMY Bilateral childhood  . TRANSURETHRAL RESECTION OF PROSTATE N/A 10/2003  . VASECTOMY      reports that he quit smoking about 53 years ago. He has a 25.00 pack-year smoking history. He has never used smokeless tobacco. He reports that he does not drink alcohol or use  drugs. Social History   Socioeconomic History  . Marital status: Widowed    Spouse name: Not on file  . Number of children: Not on file  . Years of education: Not on file  . Highest education level: Not on file  Occupational History  . Occupation: retired Insurance claims handler  Social Needs  . Financial resource strain: Not hard at all  . Food insecurity:    Worry: Never true    Inability: Never true  . Transportation needs:    Medical: No    Non-medical: No  Tobacco Use  . Smoking status: Former Smoker    Packs/day: 1.00    Years: 25.00    Pack years: 25.00    Last attempt to quit: 10/18/1964    Years since quitting: 53.0  . Smokeless tobacco: Never Used  . Tobacco comment: quit 1966  Substance and Sexual Activity  . Alcohol use: No    Alcohol/week: 0.6 oz    Types: 1 Glasses of wine per week    Frequency: Never  . Drug use: No  . Sexual activity: Not on file  Lifestyle  . Physical activity:    Days per week: 5 days    Minutes per session: 10 min  . Stress: Not at all  Relationships  . Social connections:    Talks on phone: More than three times a week    Gets together: More than three times a week    Attends religious service: 1 to 4 times per year    Active member of club or organization: Yes    Attends meetings of clubs or organizations: 1 to 4 times per year    Relationship status: Widowed  . Intimate partner violence:    Fear of current or ex partner: No    Emotionally abused: No    Physically abused: No    Forced sexual activity: No  Other Topics Concern  . Not on file  Social History Narrative   [facility] since 08/21/2011   Married to [place] Will   Exercise: 3 x week bicycle   Previously smoked stopped 1966   Does not drink cafferinated beverage   Drinks one small glass of wine few nights a week     Functional Status Survey:    Family History  Problem Relation Age of Onset  . Cancer Mother   . Heart disease Father      Health Maintenance  Topic Date Due  . PNA vac Low Risk Adult (2 of 2 - PPSV23) 03/21/2011  . INFLUENZA VACCINE  02/17/2018  . TETANUS/TDAP  06/26/2021    No Known Allergies  Outpatient Encounter Medications as of 11/01/2017  Medication Sig  . aspirin 325 MG tablet Take 325 mg by mouth every evening.  . dorzolamide (TRUSOPT) 2 % ophthalmic solution Place 1 drop into both eyes 2 (two) times daily.  . enalapril (VASOTEC) 20 MG tablet TAKE 1 TABLET TWICE A DAY TO CONTROL BLOOD PRESSURE  . lovastatin (MEVACOR) 20 MG tablet Take 1  tablet (20 mg total) by mouth at bedtime.  . Omega-3 Fatty Acids (FISH OIL) 1000 MG CAPS Take 1,000 mg by mouth daily.   . timolol (BETIMOL) 0.5 % ophthalmic solution Place 1 drop into both eyes 2 (two) times daily.  . Travoprost, BAK Free, (TRAVATAN Z) 0.004 % SOLN ophthalmic solution 1 drop. One drop left eye once a day for glaucoma  . [DISCONTINUED] DiphenhydrAMINE HCl (ALLERGY MEDICATION PO) Take 1 tablet by mouth daily.  . [DISCONTINUED] niacin (SLO-NIACIN) 500 MG tablet Take 500 mg by mouth at bedtime.  . [DISCONTINUED] Probiotic Product (PROBIOTIC-10 PO) Take 1 tablet by mouth daily.   . [DISCONTINUED] mineral oil-hydrophilic petrolatum (AQUAPHOR) ointment Apply topically 2 (two) times daily. Apply to both legs for dryness (Patient not taking: Reported on 11/01/2017)   No facility-administered encounter medications on file as of 11/01/2017.     Review of Systems  Constitutional: Negative for appetite change, chills, fatigue and fever.  HENT: Positive for hearing loss, postnasal drip and rhinorrhea. Negative for congestion, ear discharge, ear pain, mouth sores, sinus pressure, sinus pain, sore throat and trouble swallowing.        Has hearing aids, has dentures  Eyes: Positive for visual disturbance. Negative for pain, redness and itching.       Wears galsses, has glaucoma, s/p cataract surgery, using eye drops  Respiratory: Negative for cough, shortness  of breath and wheezing.   Cardiovascular: Negative for chest pain, palpitations and leg swelling.  Gastrointestinal: Negative for abdominal pain, blood in stool, constipation, diarrhea, nausea and vomiting.  Genitourinary: Negative for dysuria, frequency and hematuria.       Wakes up 2 times at night to urinate, denies incontinence  Musculoskeletal: Positive for arthralgias. Negative for back pain, gait problem and joint swelling.       No fall reported, good balance, knee pain at times, denies swelling or redness, self resolves  Skin: Negative for rash and wound.  Neurological: Negative for dizziness, numbness and headaches.  Hematological: Bruises/bleeds easily.  Psychiatric/Behavioral: Negative for behavioral problems, confusion, dysphoric mood and sleep disturbance. The patient is not nervous/anxious.     Vitals:   11/01/17 1327  BP: 132/64  Pulse: 62  Resp: 18  Temp: 97.9 F (36.6 C)  TempSrc: Oral  SpO2: 98%  Weight: 156 lb 3.2 oz (70.9 kg)  Height: '5\' 10"'  (1.778 m)   Body mass index is 22.41 kg/m. Physical Exam  Constitutional: He is oriented to person, place, and time. He appears well-developed and well-nourished. No distress.  HENT:  Head: Normocephalic and atraumatic.  Right Ear: External ear normal.  Left Ear: External ear normal.  Nose: Nose normal.  Mouth/Throat: Oropharynx is clear and moist. No oropharyngeal exudate.  Dentures present, hearing aid present  Cardiovascular: Normal rate, regular rhythm, normal heart sounds and intact distal pulses.  Pulmonary/Chest: Effort normal and breath sounds normal. No respiratory distress. He has no wheezes. He has no rales.  Abdominal: Soft. Bowel sounds are normal. There is no tenderness. There is no rebound and no guarding.  Musculoskeletal: Normal range of motion. He exhibits deformity. He exhibits no edema or tenderness.  Neurological: He is alert and oriented to person, place, and time.  05/03/17 MMSE 30/30, PASSED  CLOCK DRAW  Skin: Skin is warm and dry. No rash noted. He is not diaphoretic. No pallor.  Psychiatric: He has a normal mood and affect. His behavior is normal.    Labs reviewed: Basic Metabolic Panel: Recent Labs    04/26/17  0735 08/23/17 0000 10/25/17 0755  NA 141 139 142  K 3.8 4.0 4.0  CL 105 104 106  CO2 '28 29 31  ' GLUCOSE 85 74 89  BUN '16 18 19  ' CREATININE 0.90 0.89 0.90  CALCIUM 8.6 9.1 8.7   Liver Function Tests: Recent Labs    04/26/17 0735 08/23/17 0000  AST 21 22  ALT 17 15  BILITOT 0.9 0.7  PROT 5.8* 5.9*   No results for input(s): LIPASE, AMYLASE in the last 8760 hours. No results for input(s): AMMONIA in the last 8760 hours. CBC: Recent Labs    04/26/17 0735 08/23/17 0000  WBC 6.8 7.0  NEUTROABS  --  4,424  HGB 15.2 15.2  HCT 44.3 43.2  MCV 87.4 86.4  PLT 162 176   Cardiac Enzymes: No results for input(s): CKTOTAL, CKMB, CKMBINDEX, TROPONINI in the last 8760 hours. BNP: Invalid input(s): POCBNP No results found for: HGBA1C Lab Results  Component Value Date   TSH 2.52 04/26/2017   No results found for: VITAMINB12 No results found for: FOLATE No results found for: IRON, TIBC, FERRITIN  Lipid Panel: Recent Labs    12/22/16 0822 10/25/17 0755  CHOL 187 154  HDL 45 43  LDLCALC 126* 93  TRIG 78 87  CHOLHDL 4.2 3.6   No results found for: HGBA1C  Procedures since last visit: No results found.  Assessment/Plan  1. Essential hypertension Controlled, continue aspirin 325 mg daily with enalapril 20 mg bid - CMP with eGFR([facility]); Future - Lipid Panel; Future  2. History of TIA (transient ischemic attack) Continue aspirin, antihypertensive and statin. Reviewed labs - CMP with eGFR([facility]); Future - Lipid Panel; Future - CBC (no diff); Future  3. Primary open angle glaucoma (POAG) of both eyes, mild stage Continue eye drops  4. Mixed conductive and sensorineural hearing loss of both ears Continue hearing aid.   5.  Hyperlipidemia, unspecified hyperlipidemia type Continue lovastatin 20 mg daily and fish oil.  - CMP with eGFR([facility]); Future - Lipid Panel; Future  6. Goals of care, counseling/discussion Last ACP Note 08/03/2017 to 11/01/2017       ACP (Advance Care Planning) by Blanchie Serve, MD at 11/01/2017  1:30 PM    Date of Service   Author Author Type Status Note Type File Time  11/01/2017 Addend Blanchie Serve, MD Physician Signed ACP (Advance Care Planning) 11/01/2017              Goals of care discussion Reviewed goals of care with patient today. He has a living will and HCPOA paperwork. copies of HCPOA present in chart. Have requested copy of living will. Patient has a DNR form filled out today. MOST form reviewed and filled out today. Patient would like to remain a DNR in absence of pulse or breath. Patient would like hospitalization with limited interventions if medical need arises. He agrees to iv fluids and antibiotic if indicated. Patient does not want a feeding tube for nutritional support. Form filled out and reviewed and signed. Patient would like to focus on quality of life. He does not want to be kept alive in vegetable state and he does not want a life where he is a burden to himself or others. I have spent time from 1:50 pm to 2:10 pm reviewing goals of care today. All questions from patient have been answered to the best of my knowledge.                 With controlled allergy symptom,  advised to stop diphenhydramine. monitor  Labs/tests ordered:   Lab Orders     CMP with eGFR([facility])     Lipid Panel     CBC (no diff)   Next appointment: 6 months for physical, MMSE, EKG  Communication: reviewed care plan with patient    Blanchie Serve, MD Internal Medicine [place], [place] [address] Cell Phone (Monday-Friday 8 am - 5 pm): [phone] On Call: [phone] and follow prompts after 5 pm and on  weekends Office Phone: [phone] Office Fax: [phone]

## 2017-11-02 ENCOUNTER — Other Ambulatory Visit: Payer: Self-pay | Admitting: *Deleted

## 2017-11-02 ENCOUNTER — Telehealth: Payer: Self-pay | Admitting: *Deleted

## 2017-11-02 MED ORDER — PNEUMOCOCCAL VAC POLYVALENT 25 MCG/0.5ML IJ INJ: 0.5000 mL | INJECTION | INTRAMUSCULAR | 0 refills | Status: DC

## 2017-11-02 NOTE — Telephone Encounter (Signed)
Patient was questioning if his PNA vaccine had been sent to [facility]. I couldn't see where it had been ordered or discussed in his note from yesterday, so I wasn't sure which one he needed to have. Would you please send this to [facility] on [address] and just let me know when it's done and I will notify him. He brought copies of his HCPOA and Living Will also. Copies made and sent to office.

## 2017-11-02 NOTE — Telephone Encounter (Signed)
Script for PPSV23 sent to his pharmacy. Notify patient. Great that we have copies of living will and HCPOA now. Thanks.

## 2017-11-02 NOTE — Telephone Encounter (Signed)
Patient notified

## 2017-12-07 ENCOUNTER — Non-Acute Institutional Stay: Payer: Medicare Other | Admitting: Family

## 2017-12-07 ENCOUNTER — Encounter: Payer: Self-pay | Admitting: Family

## 2017-12-07 VITALS — BP 150/80 | HR 63 | Temp 97.8°F | Resp 14 | Ht 70.0 in | Wt 152.6 lb

## 2017-12-07 DIAGNOSIS — G459 Transient cerebral ischemic attack, unspecified: Secondary | ICD-10-CM

## 2017-12-07 NOTE — Progress Notes (Signed)
Location:  [place] of Service:  Clinic (12) Provider: Kamron Vanwyhe FNP-C  Blanchie Serve, MD  Patient Care Team: Blanchie Serve, MD as PCP - General (Internal Medicine) [place], [facility] Crista Luria, MD as Consulting Physician (Dermatology) Marilynne Halsted, MD as Referring Physician (Ophthalmology) Bond, Tracie Harrier, MD as Referring Physician (Ophthalmology)  Extended Emergency Contact Information Primary Emergency Contact: Gwyndolyn Saxon Address: [address], [place] [address] [place] Phone: [phone] Relation: Spouse Secondary Emergency Contact: Tawny Hopping [place] of [place] Phone: [phone] Relation: Son  Code Status:  DNR Goals of care: Advanced Directive information Advanced Directives 12/07/2017  Does Patient Have a Medical Advance Directive? Yes  Type of Paramedic of [place];Out of facility DNR (pink MOST or yellow form)  Does patient want to make changes to medical advance directive? -  Copy of [place] in Chart? Yes  Pre-existing out of facility DNR order (yellow form or pink MOST form) Yellow form placed in chart (order not valid for inpatient use);Pink MOST form placed in chart (order not valid for inpatient use)     Chief Complaint  Patient presents with  . Acute Visit    ? TIA last week    HPI:  Pt is a 84 y.o. male seen today at [facility] clinic for an acute visit for evaluation of TIA symptoms.He states on Friday 12/05/2017 around 11:53 am he had a " minor checkerboard to right eye specifically on three O'clock position of the eye.He also states was not able to find words or make a sentence for about one hour then symptoms resolved.He did not go to the Emergency when symptoms occurred.He states has had similar symptoms in the past since 2011 then subsequent episodes in 2014,Nov  23,2016,September 7 th 2018 and Oct 6th 2018.He states was evaluated in ER and has seen an ophthalmologist but exam was normal.He denies any weakness,numbness,tingling of face or extremities.He also denies any trouble walking,dizziness,headache, nausea or vomiting.   Past Medical History:  Diagnosis Date  . Actinic keratosis 10/18/2012  . 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   . Atrophy, Fuchs' 12/10/2011  . 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 Cervicalgia 04/25/2013  . 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 Cyclitis, chronic 11/18/2012  . Deafness 10/18/2012  . Endothelial corneal dystrophy   . Essential hypertension 10/18/2012  . Hammer toe 04/24/2014  . Hyperlipidemia 10/18/2012  . Hypertension   . Intermediate stage nonexudative age-related macular degeneration of both eyes 04/16/2016  . Nocturia 05/19/2016  . Occlusion and stenosis of carotid artery with cerebral infarction   . Other and unspecified hyperlipidemia   . Other specified cardiac dysrhythmias(427.89)   . Pain in joint, pelvic region and thigh   . Paresthesia 04/30/2015   Toes of both feet   . Peripheral vascular disease, unspecified ([place])   . Primary open angle glaucoma 09/25/2011  . 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  . Rosacea   . Seborrheic keratosis 04/24/2014  . Syncope 06/27/2013  . TIA (transient ischemic attack) 10/18/2012   States he has had about 5  times  . Unspecified glaucoma(365.9)   . Urine frequency    Past Surgical History:  Procedure Laterality Date  . CATARACT EXTRACTION  2005   bilateral  . CORNEAL  TRANSPLANT  2010 left; 2013 right eye  . HERNIA REPAIR  1985   Left  . HERNIA REPAIR  03/2004   Right  . SHOULDER DEBRIDEMENT Left 2005  . TONSILLECTOMY Bilateral childhood  . TRANSURETHRAL RESECTION OF PROSTATE N/A 10/2003  . VASECTOMY      No Known Allergies  Outpatient Encounter Medications as of 12/07/2017  Medication Sig  . aspirin 325 MG tablet  Take 325 mg by mouth every evening.  . dorzolamide (TRUSOPT) 2 % ophthalmic solution Place 1 drop into both eyes 2 (two) times daily.  . enalapril (VASOTEC) 20 MG tablet TAKE 1 TABLET TWICE A DAY TO CONTROL BLOOD PRESSURE  . loratadine (CLARITIN) 10 MG tablet Take 10 mg by mouth daily.  Marland Kitchen lovastatin (MEVACOR) 20 MG tablet Take 1 tablet (20 mg total) by mouth at bedtime.  . Omega-3 Fatty Acids (FISH OIL) 1000 MG CAPS Take 1,000 mg by mouth daily.   . timolol (BETIMOL) 0.5 % ophthalmic solution Place 1 drop into both eyes 2 (two) times daily.  . Travoprost, BAK Free, (TRAVATAN Z) 0.004 % SOLN ophthalmic solution 1 drop. One drop left eye once a day for glaucoma  . pneumococcal 23 valent vaccine (PNEUMOVAX 23) 25 MCG/0.5ML injection Inject 0.5 mLs into the muscle as directed.   No facility-administered encounter medications on file as of 12/07/2017.     Review of Systems  Constitutional: Negative for appetite change, chills, fatigue and fever.  HENT: Positive for hearing loss. Negative for congestion, rhinorrhea, sinus pressure, sinus pain, sneezing, sore throat and trouble swallowing.        Wears hearing aids   Eyes: Positive for visual disturbance. Negative for discharge, redness and itching.       Wears eye glasses  Respiratory: Negative for cough, chest tightness, shortness of breath and wheezing.   Cardiovascular: Negative for chest pain, palpitations and leg swelling.  Gastrointestinal: Negative for abdominal distention, abdominal pain, constipation, diarrhea, nausea and vomiting.  Musculoskeletal: Negative for gait problem.  Skin: Negative for color change, pallor and rash.  Neurological: Negative for dizziness, seizures, syncope, facial asymmetry, weakness, light-headedness, numbness and headaches.  Psychiatric/Behavioral: Negative for agitation, confusion and sleep disturbance. The patient is not nervous/anxious.     Immunization History  Administered Date(s) Administered  .  Influenza Whole 04/19/2012, 04/20/2013  . Influenza-Unspecified 05/03/2014, 04/18/2015, 04/30/2016, 04/18/2017  . Pneumococcal Conjugate-13 03/20/2010  . Pneumococcal Polysaccharide-23 11/15/2017  . Tdap 06/27/2011   Pertinent  Health Maintenance Due  Topic Date Due  . PNA vac Low Risk Adult (2 of 2 - PPSV23) 03/21/2011  . INFLUENZA VACCINE  02/17/2018   Fall Risk  06/25/2017 01/25/2017 12/25/2016 05/19/2016 10/29/2015  Falls in the past year? No No No No No    Vitals:   12/07/17 1259  BP: (!) 150/80  Pulse: 63  Resp: 14  Temp: 97.8 F (36.6 C)  TempSrc: Oral  SpO2: 96%  Weight: 152 lb 9.6 oz (69.2 kg)  Height: 5\' 10"  (1.778 m)   Body mass index is 21.9 kg/m. Physical Exam  Constitutional: He is oriented to person, place, and time.  Tall built,elderly in no acute distress   HENT:  Head: Normocephalic.  Right Ear: External ear normal.  Left Ear: External ear normal.  Mouth/Throat: Oropharynx is clear and moist. No oropharyngeal exudate.  Eyes: Conjunctivae and EOM are normal. Right eye exhibits no discharge. Left eye exhibits no discharge. No scleral icterus.  Pupils reactive to light left pupil greater than the right( chronic).  Neck: Normal range of motion. No JVD present. No thyromegaly present.  Cardiovascular: Normal rate, regular rhythm, normal heart sounds and intact distal pulses. Exam reveals no gallop and no friction rub.  No murmur heard. Pulmonary/Chest: Effort normal and breath sounds normal. No stridor. No respiratory distress. He has no wheezes. He has no rales.  Abdominal: Soft. Bowel sounds are normal. He exhibits no distension and no mass. There is no tenderness. There is no rebound and no guarding.  Musculoskeletal: Normal range of motion. He exhibits no edema or tenderness.  Lymphadenopathy:    He has no cervical adenopathy.  Neurological: He is oriented to person, place, and time. He has normal strength. No cranial nerve deficit or sensory deficit.  Skin:  Skin is warm and dry. No rash noted. No erythema. No pallor.  Psychiatric: He has a normal mood and affect. His behavior is normal.  Vitals reviewed.  Labs reviewed: Recent Labs    04/26/17 0735 08/23/17 0000 10/25/17 0755  NA 141 139 142  K 3.8 4.0 4.0  CL 105 104 106  CO2 28 29 31   GLUCOSE 85 74 89  BUN 16 18 19   CREATININE 0.90 0.89 0.90  CALCIUM 8.6 9.1 8.7   Recent Labs    04/26/17 0735 08/23/17 0000  AST 21 22  ALT 17 15  BILITOT 0.9 0.7  PROT 5.8* 5.9*   Recent Labs    04/26/17 0735 08/23/17 0000  WBC 6.8 7.0  NEUTROABS  --  4,424  HGB 15.2 15.2  HCT 44.3 43.2  MCV 87.4 86.4  PLT 162 176   Lab Results  Component Value Date   TSH 2.52 04/26/2017   No results found for: HGBA1C Lab Results  Component Value Date   CHOL 154 10/25/2017   HDL 43 10/25/2017   LDLCALC 93 10/25/2017   TRIG 87 10/25/2017   CHOLHDL 3.6 10/25/2017    Significant Diagnostic Results in last 30 days:  No results found.  Assessment/Plan  Transient cerebral ischemia TIA symptoms mainly on right eye and difficulty finding words/making sentence on 12/05/2017 though did not go to Emergency room.No weakness or facial asymmetry reported.Symptoms resolved after one hour.VSS stable.Has had similar symptoms in the past with negative CT scan and eye exam.unlikely to be stroke symptoms.will obtain CT scan to rule out stroke.Encouraged to go to the emergency room if symptoms recur.continue on lovastatin 20 mg tablet daily at bedtime and Asprin 325 mg tablet daily.Will refer to Neurologist if symptoms persist. - CT HEAD W & WO CONTRAST; Future  Family/ staff Communication: Reviewed plan of care with patient and facility Nurse supervisor  Labs/tests ordered: Vickery C Roselee Tayloe, NP

## 2017-12-07 NOTE — Patient Instructions (Signed)
1. Schedule for CT scan of head with or without contrast  2. Call 911  if symptoms occurs again  Transient Ischemic Attack  A transient ischemic attack (TIA) is a "warning stroke" that causes stroke-like symptoms that then go away quickly. The symptoms of a TIA come on suddenly, and they last less than 24 hours. Unlike a stroke, a TIA does not cause permanent damage to the brain. It is important to know the symptoms of a TIA and what to do. Seek medical care right away, even if your symptoms go away. Having a TIA is a sign that you are at higher risk for a permanent stroke. Lifestyle changes and medical treatments can help prevent a stroke. What are the causes? This condition is caused by a temporary blockage in an artery in the head or neck. The blockage does not allow the brain to get the blood supply it needs and can cause various symptoms. The blockage can be caused by:  Fatty buildup in an artery in the head or neck (atherosclerosis).  A blood clot.  Tearing of an artery (dissection).  Inflammation of an artery (vasculitis).  Sometimes the cause is not known. What increases the risk? Certain factors may make you more likely to develop this condition. Some of these factors are things that you can change, such as:  Obesity.  Using products that contain nicotine or tobacco, such as cigarettes and e-cigarettes.  Taking oral birth control, especially if you also use tobacco.  Lack of physical inactivity.  Excessive use of alcohol.  Use of drugs, especially cocaine and methamphetamine.  Other risk factors include:  High blood pressure (hypertension).  High cholesterol.  Diabetes mellitus.  Heart disease (coronary artery disease).  Atrial fibrillation.  Being African American or Hispanic.  Being over the age of 33.  Being male.  Family history of stroke.  Previous history of blood clots, stroke, TIA, or heart attack.  Sickle cell disease.  Being a woman with a  history of preeclampsia.  Migraine headache.  Sleep apnea.  Chronic inflammatory diseases, such as rheumatoid arthritis or lupus.  Blood clotting disorders (hypercoagulable state).  What are the signs or symptoms? Symptoms of this condition are the same as those of a stroke, but they are temporary. The symptoms develop suddenly, and they go away quickly, usually within minutes to hours. Symptoms may include sudden:  Weakness or numbness in your face, arm, or leg, especially on one side of your body.  Trouble walking or difficulty moving your arms or legs.  Trouble speaking, understanding speech, or both (aphasia).  Vision changes in one or both eyes. These include double vision, blurred vision, or loss of vision.  Dizziness.  Confusion.  Loss of balance or coordination.  Nausea and vomiting.  Severe headache with no known cause.  If possible, make note of the exact time that you last felt like your normal self and what time your symptoms started. Tell your health care provider. How is this diagnosed? This condition may be diagnosed based on:  Your symptoms and medical history.  A physical exam.  Imaging tests, usually a CT or MRI scan of the brain.  Blood tests.  You may also have other tests, including:  Electrocardiogram (ECG).  Echocardiogram.  Carotid ultrasound.  A scan of the brain circulation (CT angiogram or MRI angiogram).  Continuous heart monitoring.  How is this treated? The goal of treatment is to reduce the risk for a subsequent stroke. Treatment may include stroke prevention  therapies such as:  Changes to diet or lifestyle to decrease your risk. Lifestyle changes may include exercising and stopping smoking.  Medicines to thin the blood (antiplatelets or anticoagulants).  Blood pressure medicines.  Medicines to reduce cholesterol.  Treating other health conditions, such as diabetes or atrial fibrillation.  If testing shows that you  have narrowing in the arteries to your brain, your health care provider may recommend a procedure, such as:  Carotid endarterectomy. This is a surgery to remove the blockage from your artery.  Carotid angioplasty and stenting. This is a procedure to open or widen an artery in the neck using a metal mesh tube (stent). The stent helps keep the artery open by supporting the artery walls.  Follow these instructions at home: Medicines  Take over-the-counter and prescription medicines only as told by your health care provider.  If you were told to take a medicine to thin your blood, such as aspirin or an anticoagulant, take it exactly as told by your health care provider. ? Taking too much blood-thinning medicine can cause bleeding. ? If you do not take enough blood-thinning medicine, you will not have the protection that you need against a stroke and other problems. Eating and drinking  Eat 5 or more servings of fruits and vegetables each day.  Follow instructions from your health care provider about diet. You may need to follow a certain nutrition plan to help manage risk factors for stroke, such as high blood pressure, high cholesterol, diabetes, or obesity. This may include: ? Eating a low-fat, low-salt diet. ? Including a lot of fiber in your diet. ? Limiting the amount of carbohydrates and sugar in your diet.  Limit alcohol intake to no more than 1 drink a day for nonpregnant women and 2 drinks a day for men. One drink equals 12 oz of beer, 5 oz of wine, or 1 oz of hard liquor. General instructions  Maintain a healthy weight.  Stay physically active. Try to get at least 30 minutes of exercise on most or all days.  Find out if you have sleep apnea, and seek treatment if needed.  Do not use any products that contain nicotine or tobacco, such as cigarettes and e-cigarettes. If you need help quitting, ask your health care provider.  Do not abuse drugs.  Keep all follow-up visits as  told by your health care provider. This is important. Where to find more information:  American Stroke Association: www.strokeassociation.org  National Stroke Association: www.stroke.org Get help right away if:  You have chest pain or an irregular heartbeat.  You have any symptoms of stroke. The acronym BEFAST is an easy way to remember the main warning signs of stroke. ? B = Balance problems. Signs include dizziness, sudden trouble walking, or loss of balance. ? E = Eye problems. This includes trouble seeing or a sudden change in vision. ? F = Face changes. This includes sudden weakness or numbness of the face, or the face or eyelid drooping to one side. ? A = Arm weakness or numbness. This happens suddenly and usually on one side of the body. ? S = Speech problems. This includes trouble speaking or trouble understanding speech. ? T = Time. Time to call 911 or seek emergency care. Do not wait to see if symptoms will go away. Make note of the time your symptoms started.  Other signs of stroke may include: ? A sudden, severe headache with no known cause. ? Nausea or vomiting. ? Seizure. These  symptoms may represent a serious problem that is an emergency. Do not wait to see if the symptoms will go away. Get medical help right away. Call your local emergency services (911 in the [place] Do not drive yourself to the hospital. Summary  A TIA happens when an artery in the head or neck is blocked, leading to stroke-like symptoms that then go away quickly. The blockage clears before there is any permanent brain damage. A TIA is a medical emergency and requires immediate medical attention.  Symptoms of this condition are the same as those of a stroke, but they are temporary. The symptoms usually develop suddenly, and they go away quickly, usually within minutes to hours.  Having a TIA means that you are at high risk of a stroke in the near future.  Treatment may include medicines to thin the  blood as well as medicines, diet changes, and lifestyle changes to manage conditions that increase the risk of another TIA or a stroke. This information is not intended to replace advice given to you by your health care provider. Make sure you discuss any questions you have with your health care provider. Document Released: 04/15/2005 Document Revised: 08/18/2016 Document Reviewed: 08/18/2016 Elsevier Interactive Patient Education  2018 [employer].

## 2017-12-08 DIAGNOSIS — H353131 Nonexudative age-related macular degeneration, bilateral, early dry stage: Secondary | ICD-10-CM | POA: Diagnosis not present

## 2017-12-08 DIAGNOSIS — H401132 Primary open-angle glaucoma, bilateral, moderate stage: Secondary | ICD-10-CM | POA: Diagnosis not present

## 2017-12-09 DIAGNOSIS — L821 Other seborrheic keratosis: Secondary | ICD-10-CM | POA: Diagnosis not present

## 2017-12-09 DIAGNOSIS — D225 Melanocytic nevi of trunk: Secondary | ICD-10-CM | POA: Diagnosis not present

## 2017-12-09 DIAGNOSIS — D485 Neoplasm of uncertain behavior of skin: Secondary | ICD-10-CM | POA: Diagnosis not present

## 2017-12-09 DIAGNOSIS — L820 Inflamed seborrheic keratosis: Secondary | ICD-10-CM | POA: Diagnosis not present

## 2017-12-09 DIAGNOSIS — L570 Actinic keratosis: Secondary | ICD-10-CM | POA: Diagnosis not present

## 2017-12-09 DIAGNOSIS — D1801 Hemangioma of skin and subcutaneous tissue: Secondary | ICD-10-CM | POA: Diagnosis not present

## 2017-12-17 ENCOUNTER — Ambulatory Visit
Admission: RE | Admit: 2017-12-17 | Discharge: 2017-12-17 | Disposition: A | Discharge status: Home / Self Care | Payer: Medicare Other | Source: Ambulatory Visit | Attending: Family | Admitting: Family

## 2017-12-17 DIAGNOSIS — G459 Transient cerebral ischemic attack, unspecified: Secondary | ICD-10-CM

## 2017-12-17 MED ORDER — IOPAMIDOL (ISOVUE-300) INJECTION 61%
75.0000 mL | Freq: Once | INTRAVENOUS | Status: AC | PRN
  Administered 2017-12-17: 75 mL via INTRAVENOUS

## 2017-12-24 DIAGNOSIS — M79671 Pain in right foot: Secondary | ICD-10-CM | POA: Diagnosis not present

## 2017-12-24 DIAGNOSIS — B351 Tinea unguium: Secondary | ICD-10-CM | POA: Diagnosis not present

## 2017-12-24 DIAGNOSIS — M79672 Pain in left foot: Secondary | ICD-10-CM | POA: Diagnosis not present

## 2018-01-05 ENCOUNTER — Observation Stay (HOSPITAL_COMMUNITY): Payer: Medicare Other

## 2018-01-05 ENCOUNTER — Observation Stay (HOSPITAL_COMMUNITY)
Admission: EM | Admit: 2018-01-05 | Discharge: 2018-01-06 | Disposition: A | Discharge status: Home / Self Care | Payer: Medicare Other | Attending: Family Medicine | Admitting: Family Medicine

## 2018-01-05 ENCOUNTER — Emergency Department (HOSPITAL_COMMUNITY): Payer: Medicare Other

## 2018-01-05 ENCOUNTER — Other Ambulatory Visit: Payer: Self-pay

## 2018-01-05 ENCOUNTER — Encounter (HOSPITAL_COMMUNITY): Payer: Self-pay

## 2018-01-05 DIAGNOSIS — Z87891 Personal history of nicotine dependence: Secondary | ICD-10-CM | POA: Insufficient documentation

## 2018-01-05 DIAGNOSIS — I639 Cerebral infarction, unspecified: Secondary | ICD-10-CM | POA: Diagnosis not present

## 2018-01-05 DIAGNOSIS — E785 Hyperlipidemia, unspecified: Secondary | ICD-10-CM | POA: Diagnosis present

## 2018-01-05 DIAGNOSIS — R531 Weakness: Secondary | ICD-10-CM | POA: Diagnosis not present

## 2018-01-05 DIAGNOSIS — R29810 Facial weakness: Secondary | ICD-10-CM | POA: Diagnosis not present

## 2018-01-05 DIAGNOSIS — Z8673 Personal history of transient ischemic attack (TIA), and cerebral infarction without residual deficits: Secondary | ICD-10-CM

## 2018-01-05 DIAGNOSIS — I10 Essential (primary) hypertension: Secondary | ICD-10-CM | POA: Diagnosis present

## 2018-01-05 DIAGNOSIS — Z79899 Other long term (current) drug therapy: Secondary | ICD-10-CM | POA: Diagnosis not present

## 2018-01-05 DIAGNOSIS — I672 Cerebral atherosclerosis: Secondary | ICD-10-CM | POA: Diagnosis not present

## 2018-01-05 DIAGNOSIS — Z7982 Long term (current) use of aspirin: Secondary | ICD-10-CM | POA: Insufficient documentation

## 2018-01-05 DIAGNOSIS — G459 Transient cerebral ischemic attack, unspecified: Principal | ICD-10-CM | POA: Diagnosis present

## 2018-01-05 DIAGNOSIS — R4781 Slurred speech: Secondary | ICD-10-CM | POA: Diagnosis not present

## 2018-01-05 DIAGNOSIS — R29818 Other symptoms and signs involving the nervous system: Secondary | ICD-10-CM | POA: Diagnosis present

## 2018-01-05 LAB — COMPREHENSIVE METABOLIC PANEL
ALT: 24 U/L (ref 17–63)
ANION GAP: 7 (ref 5–15)
AST: 28 U/L (ref 15–41)
Albumin: 3.7 g/dL (ref 3.5–5.0)
Alkaline Phosphatase: 48 U/L (ref 38–126)
BUN: 14 mg/dL (ref 6–20)
CHLORIDE: 104 mmol/L (ref 101–111)
CO2: 25 mmol/L (ref 22–32)
CREATININE: 0.79 mg/dL (ref 0.61–1.24)
Calcium: 8.7 mg/dL — ABNORMAL LOW (ref 8.9–10.3)
Glucose, Bld: 89 mg/dL (ref 65–99)
POTASSIUM: 3.7 mmol/L (ref 3.5–5.1)
SODIUM: 136 mmol/L (ref 135–145)
Total Bilirubin: 1.1 mg/dL (ref 0.3–1.2)
Total Protein: 5.9 g/dL — ABNORMAL LOW (ref 6.5–8.1)

## 2018-01-05 LAB — DIFFERENTIAL
Abs Immature Granulocytes: 0 10*3/uL (ref 0.0–0.1)
BASOS PCT: 1 %
Basophils Absolute: 0.1 10*3/uL (ref 0.0–0.1)
EOS ABS: 0.3 10*3/uL (ref 0.0–0.7)
EOS PCT: 4 %
Immature Granulocytes: 0 %
Lymphocytes Relative: 23 %
Lymphs Abs: 1.7 10*3/uL (ref 0.7–4.0)
MONO ABS: 0.9 10*3/uL (ref 0.1–1.0)
MONOS PCT: 12 %
NEUTROS PCT: 60 %
Neutro Abs: 4.5 10*3/uL (ref 1.7–7.7)

## 2018-01-05 LAB — APTT: aPTT: 37 seconds — ABNORMAL HIGH (ref 24–36)

## 2018-01-05 LAB — URINALYSIS, ROUTINE W REFLEX MICROSCOPIC
BACTERIA UA: NONE SEEN
Bilirubin Urine: NEGATIVE
Glucose, UA: NEGATIVE mg/dL
KETONES UR: NEGATIVE mg/dL
Leukocytes, UA: NEGATIVE
Nitrite: NEGATIVE
PH: 8 (ref 5.0–8.0)
Protein, ur: NEGATIVE mg/dL
Specific Gravity, Urine: 1.002 — ABNORMAL LOW (ref 1.005–1.030)

## 2018-01-05 LAB — I-STAT TROPONIN, ED: TROPONIN I, POC: 0 ng/mL (ref 0.00–0.08)

## 2018-01-05 LAB — CBC
HCT: 44.8 % (ref 39.0–52.0)
Hemoglobin: 15.1 g/dL (ref 13.0–17.0)
MCH: 29.9 pg (ref 26.0–34.0)
MCHC: 33.7 g/dL (ref 30.0–36.0)
MCV: 88.7 fL (ref 78.0–100.0)
PLATELETS: 152 10*3/uL (ref 150–400)
RBC: 5.05 MIL/uL (ref 4.22–5.81)
RDW: 12.6 % (ref 11.5–15.5)
WBC: 7.6 10*3/uL (ref 4.0–10.5)

## 2018-01-05 LAB — I-STAT CHEM 8, ED
BUN: 15 mg/dL (ref 6–20)
CREATININE: 0.8 mg/dL (ref 0.61–1.24)
Calcium, Ion: 1.16 mmol/L (ref 1.15–1.40)
Chloride: 101 mmol/L (ref 101–111)
Glucose, Bld: 83 mg/dL (ref 65–99)
HEMATOCRIT: 41 % (ref 39.0–52.0)
HEMOGLOBIN: 13.9 g/dL (ref 13.0–17.0)
POTASSIUM: 3.9 mmol/L (ref 3.5–5.1)
SODIUM: 139 mmol/L (ref 135–145)
TCO2: 26 mmol/L (ref 22–32)

## 2018-01-05 LAB — RAPID URINE DRUG SCREEN, HOSP PERFORMED
Amphetamines: NOT DETECTED
BENZODIAZEPINES: NOT DETECTED
COCAINE: NOT DETECTED
OPIATES: NOT DETECTED
Tetrahydrocannabinol: NOT DETECTED

## 2018-01-05 LAB — PROTIME-INR
INR: 1.09
Prothrombin Time: 14 seconds (ref 11.4–15.2)

## 2018-01-05 LAB — ETHANOL: Alcohol, Ethyl (B): <10 mg/dL (ref <10)

## 2018-01-05 LAB — SEDIMENTATION RATE: Sed Rate: 0 mm/hr (ref 0–16)

## 2018-01-05 LAB — C-REACTIVE PROTEIN: CRP: <0.8 mg/dL (ref <1.0)

## 2018-01-05 MED ORDER — HYDRALAZINE HCL 20 MG/ML IJ SOLN: 5.0000 mg | INTRAMUSCULAR | Status: DC | PRN

## 2018-01-05 MED ORDER — LATANOPROST 0.005 % OP SOLN
1.0000 [drp] | Freq: Every day | OPHTHALMIC | Status: DC
  Administered 2018-01-05: 1 [drp] via OPHTHALMIC
  Filled 2018-01-05: qty 2.5

## 2018-01-05 MED ORDER — ASPIRIN 325 MG PO TABS
325.0000 mg | ORAL_TABLET | Freq: Every evening | ORAL | Status: DC
  Administered 2018-01-05: 325 mg via ORAL
  Filled 2018-01-05 (×2): qty 1

## 2018-01-05 MED ORDER — SENNOSIDES-DOCUSATE SODIUM 8.6-50 MG PO TABS: 1.0000 | ORAL_TABLET | Freq: Every evening | ORAL | Status: DC | PRN

## 2018-01-05 MED ORDER — IOPAMIDOL (ISOVUE-370) INJECTION 76%
50.0000 mL | Freq: Once | INTRAVENOUS | Status: AC | PRN
  Administered 2018-01-05: 50 mL via INTRAVENOUS

## 2018-01-05 MED ORDER — ACETAMINOPHEN 325 MG PO TABS: 650.0000 mg | ORAL_TABLET | ORAL | Status: DC | PRN

## 2018-01-05 MED ORDER — IOPAMIDOL (ISOVUE-370) INJECTION 76%
INTRAVENOUS | Status: AC
  Administered 2018-01-05: 16:00:00
  Filled 2018-01-05: qty 50

## 2018-01-05 MED ORDER — ACETAMINOPHEN 650 MG RE SUPP: 650.0000 mg | RECTAL | Status: DC | PRN

## 2018-01-05 MED ORDER — PRESERVISION AREDS 2 PO CAPS: 1.0000 | ORAL_CAPSULE | Freq: Two times a day (BID) | ORAL | Status: DC

## 2018-01-05 MED ORDER — IOPAMIDOL (ISOVUE-370) INJECTION 76%: 50.0000 mL | Freq: Once | INTRAVENOUS | Status: DC | PRN

## 2018-01-05 MED ORDER — TIMOLOL MALEATE 0.5 % OP SOLN
1.0000 [drp] | Freq: Two times a day (BID) | OPHTHALMIC | Status: DC
  Administered 2018-01-05 – 2018-01-06 (×2): 1 [drp] via OPHTHALMIC
  Filled 2018-01-05: qty 5

## 2018-01-05 MED ORDER — PROSIGHT PO TABS
1.0000 | ORAL_TABLET | Freq: Every day | ORAL | Status: DC
  Administered 2018-01-05 – 2018-01-06 (×2): 1 via ORAL
  Filled 2018-01-05 (×2): qty 1

## 2018-01-05 MED ORDER — STROKE: EARLY STAGES OF RECOVERY BOOK
Freq: Once | Status: AC
  Administered 2018-01-05: 18:00:00
  Filled 2018-01-05: qty 1

## 2018-01-05 MED ORDER — SODIUM CHLORIDE 0.9 % IV SOLN
INTRAVENOUS | Status: DC
  Administered 2018-01-05: 21:00:00 via INTRAVENOUS

## 2018-01-05 MED ORDER — ACETAMINOPHEN 160 MG/5ML PO SOLN: 650.0000 mg | ORAL | Status: DC | PRN

## 2018-01-05 MED ORDER — ENOXAPARIN SODIUM 40 MG/0.4ML ~~LOC~~ SOLN
40.0000 mg | SUBCUTANEOUS | Status: DC
  Administered 2018-01-05: 40 mg via SUBCUTANEOUS
  Filled 2018-01-05 (×2): qty 0.4

## 2018-01-05 MED ORDER — DORZOLAMIDE HCL 2 % OP SOLN
1.0000 [drp] | Freq: Two times a day (BID) | OPHTHALMIC | Status: DC
  Administered 2018-01-05 – 2018-01-06 (×2): 1 [drp] via OPHTHALMIC
  Filled 2018-01-05: qty 10

## 2018-01-05 MED ORDER — TIMOLOL HEMIHYDRATE 0.5 % OP SOLN: 1.0000 [drp] | Freq: Two times a day (BID) | OPHTHALMIC | Status: DC

## 2018-01-05 MED ORDER — LORATADINE 10 MG PO TABS
10.0000 mg | ORAL_TABLET | Freq: Every day | ORAL | Status: DC
  Administered 2018-01-06: 10 mg via ORAL
  Filled 2018-01-05: qty 1

## 2018-01-05 MED ORDER — ATORVASTATIN CALCIUM 40 MG PO TABS
40.0000 mg | ORAL_TABLET | Freq: Every day | ORAL | Status: DC
  Administered 2018-01-05: 40 mg via ORAL
  Filled 2018-01-05 (×2): qty 1

## 2018-01-05 MED ORDER — HYDRALAZINE HCL 20 MG/ML IJ SOLN
5.0000 mg | Freq: Once | INTRAMUSCULAR | Status: AC
  Administered 2018-01-05: 5 mg via INTRAVENOUS
  Filled 2018-01-05: qty 1

## 2018-01-05 NOTE — ED Notes (Addendum)
Patient ambulated in the hallway with a steady gait with 2 person stand by assist.

## 2018-01-05 NOTE — ED Triage Notes (Addendum)
Pt arrives to ED from [facility] with complaints of stroke like sx including vision changes and aphasia since 1030, no sx upon arrival to ED. EMS reports LKW is unknown, possibly yesterday, r/t pt living alone and pt being the one who reported changes. Pt has hx of left corneal transplant, pupils are not equal baseline. Pt placed in position of comfort with bed locked and lowered, call bell in reach. EMS VS BP 256/123 HR 66 99% RR 16 room air, CBG 88

## 2018-01-05 NOTE — Consult Note (Signed)
NEURO HOSPITALIST      Requesting Physician: Dr. Dayna Barker    Chief Complaint: Transient vision changes right eye and problems finding words  History obtained from:  Patient     HPI:                                                                                                                                         Russell [name] is an 84 y.o. male with PMH significant for TIA, HLD, HTN presents to [facility] for what patient states is a TIA.  Patient states that this first started back in 2006. Suddenly his vision in his right eye began to look like a moving checker/chess board, and he had trouble finding his words.This episode lasted for 1 hour. His PCP told him that he probably had a TIA 10/24/2013. Since then he reports that he has had multiple episodes like this. It always starts with seeing a moving checker board in his right eye. The episodes do not occur frequently.  He denies ever losing consciousness. He may have " 2 in 1 month and then not again for a long time". He does not remember when the last event occurred, but he has kept a record of events on his home computer. The episodes usually last 10-15 mins. He is able to keep moving around normally and his vision returns to normal. Denies any numbness, tingling, facial droop, HA, weakness, N/V, or gait instability with any of these episodes. He states that stress could be a factor in having these episodes, but he doesn't always feel stressed with every episode. Patient on 20 mg Lovastatin and reports no muscle pain, Aspirin 325 mg, and enalapril 20 mg  for BP control. Denies missing any doses.  ED course: CT head was obtained that showed no acute infarct and no hemorrhage. Last BP: 196/76 and hydralazine was given. BG was 89.  tPA Given: No: not indicated Modified Rankin: Rankin Score=1  NIHSS:0  Further interview by attending Kerney Elbe, MD) reveals that the symptoms are stereotyped and nearly  identical with each occurrence. Never has a headache with the neurological symptoms, which occur on average 2x per year with duration of 15-30 minutes for the positive visual symptoms and for up to 2 hours for the dysphasia. The patient states that the visual symptoms always precede the dysphasia and remit prior to resolution of the dysphasia. He has never had any twitching with the spells and has no history of seizure. He reports no other accompanying neurological deficits.   Past Medical History:  Diagnosis Date  . Actinic keratosis 10/18/2012  . Anisocoria 10/30/2014   Left  pupil is larger than the right postsurgery corneal transplant.   . Atrophy, Fuchs' 12/10/2011  . 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 Cervicalgia 04/25/2013  . 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 Cyclitis, chronic 11/18/2012  . Deafness 10/18/2012  . Endothelial corneal dystrophy   . Essential hypertension 10/18/2012  . Hammer toe 04/24/2014  . Hyperlipidemia 10/18/2012  . Hypertension   . Intermediate stage nonexudative age-related macular degeneration of both eyes 04/16/2016  . Nocturia 05/19/2016  . Occlusion and stenosis of carotid artery with cerebral infarction   . Other and unspecified hyperlipidemia   . Other specified cardiac dysrhythmias(427.89)   . Pain in joint, pelvic region and thigh   . Paresthesia 04/30/2015   Toes of both feet   . Peripheral vascular disease, unspecified ([place])   . Primary open angle glaucoma 09/25/2011  . 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  . Rosacea   . Seborrheic keratosis 04/24/2014  . Syncope 06/27/2013  . TIA (transient ischemic attack) 10/18/2012   States he has had about 5  times  . Unspecified glaucoma(365.9)   . Urine frequency     Past Surgical History:  Procedure Laterality Date  . CATARACT EXTRACTION  2005   bilateral  . CORNEAL TRANSPLANT  2010 left; 2013 right eye  . HERNIA REPAIR  1985   Left  . HERNIA REPAIR  03/2004   Right  . SHOULDER DEBRIDEMENT Left 2005  .  TONSILLECTOMY Bilateral childhood  . TRANSURETHRAL RESECTION OF PROSTATE N/A 10/2003  . VASECTOMY      Family History  Problem Relation Age of Onset  . Cancer Mother   . Heart disease Father       Social History:  reports that he quit smoking about 53 years ago. He has a 25.00 pack-year smoking history. He has never used smokeless tobacco. He reports that he does not drink alcohol or use drugs.  Allergies: No Known Allergies  Medications:                                                                                                                           No current facility-administered medications for this encounter.    Current Outpatient Medications  Medication Sig Dispense Refill  . aspirin 325 MG tablet Take 325 mg by mouth every evening.    . dorzolamide (TRUSOPT) 2 % ophthalmic solution Place 1 drop into both eyes 2 (two) times daily.    . enalapril (VASOTEC) 20 MG tablet TAKE 1 TABLET TWICE A DAY TO CONTROL BLOOD PRESSURE 180 tablet 0  . loratadine (CLARITIN) 10 MG tablet Take 10 mg by mouth daily.    Marland Kitchen lovastatin (MEVACOR) 20 MG tablet Take 1 tablet (20 mg total) by mouth at bedtime. 90 tablet 3  . Omega-3 Fatty Acids (FISH OIL) 1000 MG CAPS Take 1,000 mg by mouth daily.     . pneumococcal 23 valent vaccine (PNEUMOVAX 23) 25 MCG/0.5ML injection Inject 0.5 mLs into the muscle as directed. 2.5 mL  0  . timolol (BETIMOL) 0.5 % ophthalmic solution Place 1 drop into both eyes 2 (two) times daily.    . Travoprost, BAK Free, (TRAVATAN Z) 0.004 % SOLN ophthalmic solution 1 drop. One drop left eye once a day for glaucoma       ROS:                                                                                                                                       History obtained from the patient  General ROS: negative for - chills, fatigue, fever, night sweats, weight gain or weight loss Psychological ROS: negative for - , hallucinations, memory difficulties, mood swings or   Ophthalmic ROS: negative for - blurry vision, double vision, eye pain or loss of vision. Positive for seeing a moving checker board; only during episodes.corneal transplants Respiratory ROS: negative for - cough,  shortness of breath or wheezing Cardiovascular ROS: negative for - chest pain, dyspnea on exertion,  Gastrointestinal ROS: negative for -  nausea/vomiting  Musculoskeletal ROS: negative for - joint swelling or muscular weakness Neurological ROS: as noted in HPI   General Examination:                                                                                                      Blood pressure (!) 191/81, pulse (!) 58, temperature 98.8 F (37.1 C), resp. rate 19, height 5\' 8"  (1.727 m), weight 70.3 kg (155 lb), SpO2 97 %.  HEENT-  Normocephalic, had Band-Aid on head from a skin biopsy about 2 weeks ago. without obvious abnormality.  Normal external eye and conjunctiva.  Has hearing loss at baseline and wears hearing aids. Left forehead lipoma.  Cardiovascular- S1-S2 audible, pulses palpable throughout   Lungs-no rhonchi or wheezing noted, no excessive working breathing.  Saturations within normal limits on RA Extremities- Warm, dry and intact Musculoskeletal-no joint tenderness, deformity or swelling Skin-warm and dry, biopsy incision on top of head.  Neurological Examination Mental Status: Alert, oriented,to person/place/ ti me/ event. Speech fluent without evidence of aphasia.  Able to follow commands without difficulty. Cranial Nerves: II:  Visual fields grossly normal. No extinction to DSS. ( wears glasses at baseline). III,IV, VI: ptosis not present, extra-ocular motions intact bilaterally, pupils Not equal, left pupil is 51mm and right pupil about 48mm. Left pupil has irregular shape. Patient states that this is normal since his corneal transplants in 2010 and 2013. Pupils reactive bilaterally.  V,VII: smile symmetric, facial light touch/temperature  sensation normal  bilaterally VIII: patient wears hearing aids; but can hear without them IX,X: uvula rises symmetrically XI: bilateral shoulder shrug XII: midline tongue extension Motor: Right : Upper extremity   5/5   Left:     Upper extremity   5/5  Lower extremity   5/5    Lower extremity   5/5 Tone and bulk:normal tone throughout; no atrophy noted Sensory:  light touch intact throughout, bilaterally Deep Tendon Reflexes: 2+ and symmetric throughout Plantars: Right: downgoing   Left: downgoing Cerebellar: normal finger-to-nose, normal rapid alternating movements and normal heel-to-shin test bilaterally.  Gait: not tested   Lab Results: Basic Metabolic Panel: Recent Labs  Lab 01/05/18 1201 01/05/18 1234  NA 136 139  K 3.7 3.9  CL 104 101  CO2 25  --   GLUCOSE 89 83  BUN 14 15  CREATININE 0.79 0.80  CALCIUM 8.7*  --     CBC: Recent Labs  Lab 01/05/18 1201 01/05/18 1234  WBC 7.6  --   NEUTROABS 4.5  --   HGB 15.1 13.9  HCT 44.8 41.0  MCV 88.7  --   PLT 152  --     Lipid Panel: No results for input(s): CHOL, TRIG, HDL, CHOLHDL, VLDL, LDLCALC in the last 168 hours.  CBG: No results for input(s): GLUCAP in the last 168 hours.  Imaging: Dg Chest 2 View  Result Date: 01/05/2018 CLINICAL DATA:  Visual alterations.  Hypertension. EXAM: CHEST - 2 VIEW COMPARISON:  Chest CT December 30, 2016 FINDINGS: No edema or consolidation. Heart is upper normal in size with pulmonary vascularity normal. No adenopathy. No bone lesions. IMPRESSION: No edema or consolidation. Electronically Signed   By: Lowella Grip III M.D.   On: 01/05/2018 13:23   Ct Head Wo Contrast  Result Date: 01/05/2018 CLINICAL DATA:  Aphasia and altered vision EXAM: CT HEAD WITHOUT CONTRAST TECHNIQUE: Contiguous axial images were obtained from the base of the skull through the vertex without intravenous contrast. COMPARISON:  Dec 17, 2017 FINDINGS: Brain: Moderate diffuse atrophy is stable. Prominence of the cisterna  magna is an anatomic variant. There is no intracranial mass, hemorrhage, extra-axial fluid collection, or midline shift. There is extensive small vessel disease throughout the centra semiovale bilaterally. Small vessel disease is noted in the internal and external capsule regions bilaterally. There is evidence of a prior small infarct in the left thalamus. There is a small lacunar infarct in the inferior right centrum semiovale. There is small vessel disease in the basilar perforator distribution of the mid pons. No acute infarct is demonstrated on this study. Vascular: There is no appreciable hyperdense vessel. There is calcification in each carotid siphon region as well as in the left distal vertebral artery. Skull: Bony calvarium appears intact. There is a left frontal scalp lipoma measuring 3.1 x 0.8 cm. Sinuses/Orbits: There is mucosal thickening in several ethmoid air cells. There are retention cysts in each maxillary antrum. Other paranasal sinuses are clear. Orbits appear symmetric bilaterally. Patient has had cataract extractions bilaterally. Other: Mastoid air cells are clear. IMPRESSION: 1. Atrophy with extensive supratentorial small vessel disease. Occasional prior lacunar infarcts. There is also basilar pontine distribution small vessel disease. No acute infarct evident. No mass or hemorrhage. 2.  Foci of arterial vascular calcification noted. 3.  Areas of paranasal sinus disease. 4.  Left frontal scalp lipoma. Electronically Signed   By: Lowella Grip III M.D.   On: 01/05/2018 13:19   CTA head and neck:  1. No  emergent large vessel occlusion. 2. Extensive intracranial atherosclerosis with moderate to advanced narrowing of bilateral M2 and P2 segments. Multifocal advanced narrowing of medium size vessels in the ACA distribution. 3. Atherosclerosis in the neck without flow limiting stenosis.   MRI brain: 1. No acute intracranial infarct or other abnormality. 2. Age-related cerebral atrophy  with moderate to advanced chronic small vessel ischemic disease.  MRA head: 1. Negative MRA for large vessel occlusion. 2. Extensive intracranial atherosclerosis with moderate bilateral M2 and P2 stenoses. 3. Aplastic right A1. Azygos A2 supplied via the left carotid artery system.  History and examination documented by Laurey Morale, MSN, NP-C, [facility] [phone]  01/05/2018, 2:13 PM    Assessment: 84 y.o. male with PMH significant for TIA, HLD, HTN presents to [facility] for transient changes in vision ( he sees a Scientist, product/process development), and transient word finding difficulty not associated with any other symptoms. Episodes last for 10-15 minutes.  1. TIA vs seizure vs migraine accompaniment. The former two diagnoses are felt to be relatively unlikely. The stereotyped nature of the events with delay between visual aura and dysphasia makes migraine accompaniment (essentially a migraine with aura, absent headache pain) most likely.  2. CTA head/neck shows extensive intracranial atherosclerosis with moderate to advanced narrowing of bilateral M2 and P2 segments. Multifocal advanced narrowing of medium size vessels in the ACA distribution. Although the stenoses could predispose to left occipital lobe hypoperfusion/TIA, the positive symptoms  (moving checkerboard pattern in right hemifield) and "marching phenomenon" of symptoms consistent with spreading depression propagating from left occipital lobe to mid-temporal lobe would be quite atypical for TIA and significantly more likely to occur with a migraine phenomenon.  3. Stroke Risk Factors - hyperlipidemia, hypertension and  past smoking hx    Recommendations:  --EEG --Continue aspirin 325 mg  --Continue Lovastatin 20mg   --HgbA1c, fasting lipid panel   NEUROHOSPITALIST ADDENDUM   I have seen and examined the patient. I have reviewed the contents of history and physical exam as documented by PA/ARNP/Resident and agree with  above documentation.  I have discussed and formulated the above plan as documented. Edits to the note have been made as needed.   Electronically signed: Dr. Kerney Elbe

## 2018-01-05 NOTE — ED Notes (Signed)
Neurology at the bedside

## 2018-01-05 NOTE — H&P (Signed)
History and Physical    Russell [name] BOF:[id number] DOB: 1927-01-05 DOA: 01/05/2018  PCP: Blanchie Serve, MD Consultants:  Ophthalmology, dermatology Patient coming from:  Home - lives alone in a [facility] [place]; NOKPandora Leiter, [phone]  Chief Complaint: ?TIA  HPI: Russell [name] is a 84 y.o. male with medical history significant of glaucoma; TIA; syncope;  PVD; HLD; carotid stenosis; macular degeneration; and HTN presenting with possible TIA.  He has been having TIAs for many years and he has never had this sort of evaluation before and so he is frustrated.  "Because I had a TIA, and normally when I have a TIA I wait and it goes away.  But Dr. Bubba Camp wanted me to come in the next time I have a TIA and so I did."  He has a black and right checkerboard in the right lateral part of his vision and word finding difficulty.  He usually exercises 3 times a week and this cognitive impairment is quite different from his baseline.  His symptoms today lasted from about 1030 until about 1200.  Sometimes the symptoms last an hour, sometimes 15 minutes.  The symptoms are consistent with his TIAs.  He has the episodes a couple of times a year.   ED Course:   Neurology called first.  H/o TIA, no evaluation for this in the past.  Visual change - looks like a checker board lasting 10-15 minutes with word finding difficulty.  Baseline anisocoria, exam otherwise unremarkable.  Neuro concerned for TIA - recommends full evaluation.    Review of Systems: As per HPI; otherwise review of systems reviewed and negative.   Ambulatory Status:  Ambulates without assistance  Past Medical History:  Diagnosis Date  . Actinic keratosis 10/18/2012  . 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   . Atrophy, Fuchs' 12/10/2011  . 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 Cervicalgia 04/25/2013  . 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 Cyclitis, chronic 11/18/2012  . Deafness 10/18/2012  . Endothelial corneal dystrophy   . Essential hypertension 10/18/2012  . Hammer toe 04/24/2014  . Hyperlipidemia 10/18/2012  . Intermediate stage nonexudative age-related macular degeneration of both eyes 04/16/2016  . Nocturia 05/19/2016  . Occlusion and stenosis of carotid artery with cerebral infarction   . Other specified cardiac dysrhythmias(427.89)   . Pain in joint, pelvic region and thigh   . Paresthesia 04/30/2015   Toes of both feet   . Peripheral vascular disease, unspecified ([place])   . Primary open angle glaucoma 09/25/2011  . 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  . Rosacea   . Seborrheic keratosis 04/24/2014  . Syncope 06/27/2013  . TIA (transient ischemic attack) 10/18/2012   States he has had about 5  times  . Urine frequency     Past Surgical History:  Procedure Laterality Date  . CATARACT EXTRACTION  2005   bilateral  . CORNEAL TRANSPLANT  2010 left; 2013 right eye  . HERNIA REPAIR  1985   Left  . HERNIA REPAIR  03/2004   Right  . SHOULDER DEBRIDEMENT Left 2005  . TONSILLECTOMY Bilateral childhood  . TRANSURETHRAL RESECTION OF PROSTATE N/A 10/2003  . VASECTOMY      Social History   Socioeconomic History  . Marital status: Widowed    Spouse name: Not on file  . Number of children: Not on file  . Years of education: Not on file  . Highest education level: Not  on file  Occupational History  . Occupation: retired Insurance claims handler  Social Needs  . Financial resource strain: Not hard at all  . Food insecurity:    Worry: Never true    Inability: Never true  . Transportation needs:    Medical: No    Non-medical: No  Tobacco Use  . Smoking status: Former Smoker    Packs/day: 1.00    Years: 25.00    Pack years: 25.00    Last attempt to quit: 10/18/1964    Years since quitting: 53.2  . Smokeless tobacco: Never Used  . Tobacco comment: quit 1966  Substance and Sexual Activity  . Alcohol use: No     Alcohol/week: 0.6 oz    Types: 1 Glasses of wine per week    Frequency: Never  . Drug use: No  . Sexual activity: Not on file  Lifestyle  . Physical activity:    Days per week: 5 days    Minutes per session: 10 min  . Stress: Not at all  Relationships  . Social connections:    Talks on phone: More than three times a week    Gets together: More than three times a week    Attends religious service: 1 to 4 times per year    Active member of club or organization: Yes    Attends meetings of clubs or organizations: 1 to 4 times per year    Relationship status: Widowed  . Intimate partner violence:    Fear of current or ex partner: No    Emotionally abused: No    Physically abused: No    Forced sexual activity: No  Other Topics Concern  . Not on file  Social History Narrative   Lives at [facility] since 08/21/2011   Married to Russell [name]   Exercise: 3 x week bicycle   Previously smoked stopped 1966   Does not drink cafferinated beverage   Drinks one small glass of wine few nights a week     No Known Allergies  Family History  Problem Relation Age of Onset  . Cancer Mother   . Heart disease Father     Prior to Admission medications   Medication Sig Start Date End Date Taking? Authorizing Provider  aspirin 325 MG tablet Take 325 mg by mouth every evening.    [provider]  dorzolamide (TRUSOPT) 2 % ophthalmic solution Place 1 drop into both eyes 2 (two) times daily.    [provider]  enalapril (VASOTEC) 20 MG tablet TAKE 1 TABLET TWICE A DAY TO CONTROL BLOOD PRESSURE 10/25/17   Blanchie Serve, MD  loratadine (CLARITIN) 10 MG tablet Take 10 mg by mouth daily.    [provider]  lovastatin (MEVACOR) 20 MG tablet Take 1 tablet (20 mg total) by mouth at bedtime. 12/25/16   Reed, Tiffany L, DO  Omega-3 Fatty Acids (FISH OIL) 1000 MG CAPS Take 1,000 mg by mouth daily.     [provider]  pneumococcal 23 valent vaccine  (PNEUMOVAX 23) 25 MCG/0.5ML injection Inject 0.5 mLs into the muscle as directed. 11/02/17   Blanchie Serve, MD  timolol (BETIMOL) 0.5 % ophthalmic solution Place 1 drop into both eyes 2 (two) times daily.    [provider]  Travoprost, BAK Free, (TRAVATAN Z) 0.004 % SOLN ophthalmic solution 1 drop. One drop left eye once a day for glaucoma    [provider]    Physical Exam:  Vitals:   01/05/18 1445 01/05/18 1500 01/05/18 1515 01/05/18 1555  BP: (!) 167/73 (!) 162/72 (!) 154/72   Pulse: 62 62 (!) 59   Resp: (!) 21 19 20    Temp:      TempSrc:      SpO2: 95% 100% 95% 95%  Weight:      Height:         General:  Appears calm and comfortable and is NAD Eyes:  Chronic anisocoria with left pupilary dysfunction, EOMI, normal lids, iris ENT:  grossly normal hearing, lips & tongue, mmm Neck:  no LAD, masses or thyromegaly; no carotid bruits Cardiovascular:  RRR, no m/r/g. No LE edema.  Respiratory:   CTA bilaterally with no wheezes/rales/rhonchi.  Normal respiratory effort. Abdomen:  soft, NT, ND, NABS Back:   normal alignment, no CVAT Skin:  no rash or induration seen on limited exam Musculoskeletal:  grossly normal tone BUE/BLE, good ROM, no bony abnormality Psychiatric:  grossly normal mood and affect, speech fluent and appropriate, AOx3 Neurologic:  CN 2-12 grossly intact, moves all extremities in coordinated fashion, sensation intact    Radiological Exams on Admission: Dg Chest 2 View  Result Date: 01/05/2018 CLINICAL DATA:  Visual alterations.  Hypertension. EXAM: CHEST - 2 VIEW COMPARISON:  Chest CT December 30, 2016 FINDINGS: No edema or consolidation. Heart is upper normal in size with pulmonary vascularity normal. No adenopathy. No bone lesions. IMPRESSION: No edema or consolidation. Electronically Signed   By: Lowella Grip III M.D.   On: 01/05/2018 13:23   Ct Head Wo Contrast  Result Date: 01/05/2018 CLINICAL DATA:  Aphasia and altered vision EXAM: CT  HEAD WITHOUT CONTRAST TECHNIQUE: Contiguous axial images were obtained from the base of the skull through the vertex without intravenous contrast. COMPARISON:  Dec 17, 2017 FINDINGS: Brain: Moderate diffuse atrophy is stable. Prominence of the cisterna magna is an anatomic variant. There is no intracranial mass, hemorrhage, extra-axial fluid collection, or midline shift. There is extensive small vessel disease throughout the centra semiovale bilaterally. Small vessel disease is noted in the internal and external capsule regions bilaterally. There is evidence of a prior small infarct in the left thalamus. There is a small lacunar infarct in the inferior right centrum semiovale. There is small vessel disease in the basilar perforator distribution of the mid pons. No acute infarct is demonstrated on this study. Vascular: There is no appreciable hyperdense vessel. There is calcification in each carotid siphon region as well as in the left distal vertebral artery. Skull: Bony calvarium appears intact. There is a left frontal scalp lipoma measuring 3.1 x 0.8 cm. Sinuses/Orbits: There is mucosal thickening in several ethmoid air cells. There are retention cysts in each maxillary antrum. Other paranasal sinuses are clear. Orbits appear symmetric bilaterally. Patient has had cataract extractions bilaterally. Other: Mastoid air cells are clear. IMPRESSION: 1. Atrophy with extensive supratentorial small vessel disease. Occasional prior lacunar infarcts. There is also basilar pontine distribution small vessel disease. No acute infarct evident. No mass or hemorrhage. 2.  Foci of arterial vascular calcification noted. 3.  Areas of paranasal sinus disease. 4.  Left frontal scalp lipoma. Electronically Signed   By: Lowella Grip III M.D.   On: 01/05/2018 13:19    EKG: Independently reviewed.  NSR with rate 61; nonspecific ST changes with no evidence of acute ischemia   Labs on Admission: I have personally reviewed the  available labs and imaging studies at the time of the admission.  Pertinent labs:  CMP nremarkable Troponin 0.00 Normal CBC UA: moderate Hgb ETOH negative UDS negative Normal TSH in 10/18 Lipids 10/25/17: 154/43/93/87  Assessment/Plan Principal Problem:   TIA (transient ischemic attack) Active Problems:   Essential hypertension   Hyperlipidemia   TIA -Patient with apparent h/o TIAs for which he has not apparently had an extensive evaluation presenting with symptoms concerning for repeat TIA -He was evaluated by neurology and they recommend observation for thorough evaluation -[name] place in observation status for CVA/TIA evaluation -Telemetry monitoring -MRI/MRA -CTA head/neck -Echo -Recent risk stratification with FLP so [name] not repeat -No hyperglycemia so [name] not check A1c at this time -ASA daily - he has been taking 325 mg daily and [name] continue -PT/OT/ST/Nutrition Consults -SW consult for return to [facility]  HTN -Allow permissive HTN -Treat BP only if >220/120, and then with goal of 15% reduction -Hold Enalapril and plan to restart in 48-72 hours  HLD -Recent FLP with LDL 93, [name] not repeat -He has been on Mevacor -[name] change to Lipitor 40 mg    DVT prophylaxis:  Lovenox Code Status:  DNR - confirmed with patient Family Communication: None present Disposition Plan:  Home once clinically improved - he lives in independent living but came in by ambulance and [name] need transport back Consults called: Neurology; PT/OT/ST/Nutrition/SW Admission status: It is my clinical opinion that referral for OBSERVATION is reasonable and necessary in this patient based on the above information provided. The aforementioned taken together are felt to place the patient at high risk for further clinical deterioration. However it is anticipated that the patient may be medically stable for discharge from the hospital within 24 to 48 hours.    Karmen Bongo MD [facility]  If note is complete, please contact covering daytime or nighttime physician. [email or web address] Password [facility]  01/05/2018, 4:12 PM

## 2018-01-05 NOTE — ED Notes (Signed)
While MD is assessing, pt states "I had a TIA; I have them all the time. My vision changes and looks like a Nature conservation officer."

## 2018-01-06 ENCOUNTER — Observation Stay (HOSPITAL_COMMUNITY): Payer: Medicare Other

## 2018-01-06 ENCOUNTER — Observation Stay (HOSPITAL_BASED_OUTPATIENT_CLINIC_OR_DEPARTMENT_OTHER): Payer: Medicare Other

## 2018-01-06 DIAGNOSIS — I10 Essential (primary) hypertension: Secondary | ICD-10-CM | POA: Diagnosis not present

## 2018-01-06 DIAGNOSIS — G459 Transient cerebral ischemic attack, unspecified: Secondary | ICD-10-CM | POA: Diagnosis not present

## 2018-01-06 DIAGNOSIS — I5030 Unspecified diastolic (congestive) heart failure: Secondary | ICD-10-CM

## 2018-01-06 DIAGNOSIS — H539 Unspecified visual disturbance: Secondary | ICD-10-CM | POA: Diagnosis not present

## 2018-01-06 DIAGNOSIS — E785 Hyperlipidemia, unspecified: Secondary | ICD-10-CM | POA: Diagnosis not present

## 2018-01-06 LAB — ECHOCARDIOGRAM COMPLETE
Height: 68 in
WEIGHTICAEL: 2480 [oz_av]

## 2018-01-06 MED ORDER — ATORVASTATIN CALCIUM 40 MG PO TABS: 40.0000 mg | ORAL_TABLET | Freq: Every day | ORAL | 3 refills | Status: DC

## 2018-01-06 NOTE — Procedures (Signed)
ELECTROENCEPHALOGRAM REPORT  Date of Study: 01/06/2018  Patient's Name: Russell [name] MRN: [id number] Date of Birth: [birth year]  Referring Provider: Dr. Myrene Buddy  Clinical History: This is an 84 year old man with transient vision changes and word-finding difficulties.  Medications: No AEDs or sedating medications listed  Technical Summary: A multichannel digital EEG recording measured by the international 10-20 system with electrodes applied with paste and impedances below 5000 ohms performed in our laboratory with EKG monitoring in an awake and asleep patient.  Hyperventilation and photic stimulation were not performed.  The digital EEG was referentially recorded, reformatted, and digitally filtered in a variety of bipolar and referential montages for optimal display.    Description: The patient is awake and asleep during the recording.  During maximal wakefulness, there is a symmetric, medium voltage 8 Hz posterior dominant rhythm that attenuates with eye opening.  The record is symmetric.  During drowsiness and sleep, there is an increase in theta slowing of the background.  Vertex waves and symmetric sleep spindles were seen.  Hyperventilation and photic stimulation did not elicit any abnormalities.  There were no epileptiform discharges or electrographic seizures seen.    EKG lead was unremarkable.  Impression: This awake and asleep EEG is normal.    Clinical Correlation: A normal EEG does not exclude a clinical diagnosis of epilepsy. Clinical correlation is advised.   Ellouise Newer, M.D.

## 2018-01-06 NOTE — Evaluation (Signed)
Speech Language Pathology Evaluation Patient Details Name: Russell [name] MRN: [id number] DOB: 26-Feb-1927 Today's Date: 01/06/2018 Time: 1440-1450 SLP Time Calculation (min) (ACUTE ONLY): 10 min  Problem List:  Patient Active Problem List   Diagnosis Date Noted  . TIA (transient ischemic attack) 01/05/2018  . History of TIA (transient ischemic attack) 05/03/2017  . Nocturia 05/19/2016  . Intermediate stage nonexudative age-related macular degeneration of both eyes 04/16/2016  . Pain in joint, shoulder region 10/29/2015  . 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  . Paresthesia 04/30/2015  . Anisocoria 10/30/2014  . Hammer toe 04/24/2014  . Pain in knee 04/24/2014  . Seborrheic keratosis 04/24/2014  . Cervicalgia 04/25/2013  . 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 Cyclitis, chronic 11/18/2012  . Essential hypertension 10/18/2012  . Hyperlipidemia 10/18/2012  . 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 Pain in joint, pelvic region and thigh 10/18/2012  . Other specified cardiac dysrhythmias(427.89) 10/18/2012  . 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 Rosacea 10/18/2012  . Actinic keratosis 10/18/2012  . 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 Atrophy, Fuchs' 12/10/2011  . Primary open angle glaucoma 09/25/2011   Past Medical History:  Past Medical History:  Diagnosis Date  . Actinic keratosis 10/18/2012  . 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   . Atrophy, Fuchs' 12/10/2011  . 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 Cervicalgia 04/25/2013  . 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 Cyclitis, chronic 11/18/2012  . Deafness 10/18/2012  . Endothelial corneal dystrophy   . Essential hypertension 10/18/2012  . Hammer toe 04/24/2014  . Hyperlipidemia 10/18/2012  . Intermediate stage nonexudative age-related macular degeneration of both eyes 04/16/2016  . Nocturia 05/19/2016  . Occlusion and stenosis of carotid  artery with cerebral infarction   . Other specified cardiac dysrhythmias(427.89)   . Pain in joint, pelvic region and thigh   . Paresthesia 04/30/2015   Toes of both feet   . Peripheral vascular disease, unspecified ([place])   . Primary open angle glaucoma 09/25/2011  . 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  . Rosacea   . Seborrheic keratosis 04/24/2014  . Syncope 06/27/2013  . TIA (transient ischemic attack) 10/18/2012   States he has had about 5  times  . Urine frequency    Past Surgical History:  Past Surgical History:  Procedure Laterality Date  . CATARACT EXTRACTION  2005   bilateral  . CORNEAL TRANSPLANT  2010 left; 2013 right eye  . HERNIA REPAIR  1985   Left  . HERNIA REPAIR  03/2004   Right  . SHOULDER DEBRIDEMENT Left 2005  . TONSILLECTOMY Bilateral childhood  . TRANSURETHRAL RESECTION OF PROSTATE N/A 10/2003  . VASECTOMY     HPI:  84 y.o. male with medical history significant of glaucoma; TIA; syncope;  PVD; HLD; carotid stenosis; macular degeneration; and HTN presenting with possible TIA.  He has been having TIAs for many years and he has never had this sort of evaluation before and so he is frustrated.  "Because I had a TIA, and normally when I have a TIA I wait and it goes away.  But Dr. Bubba Camp wanted me to come in the next time I have a TIA and so I did."  He has a black and right checkerboard in the right lateral part of his vision and word finding difficulty.  He usually exercises 3 times a week and this cognitive impairment is quite different from his baseline.  His symptoms today lasted from about 1030  until about 1200.  Sometimes the symptoms last an hour, sometimes 15 minutes.  The symptoms are consistent with his TIAs.  He has the episodes a couple of times a year   Assessment / Plan / Recommendation Clinical Impression   Pt presents with grossly intact cognitive-linguistic function.  Speech is fluent and free from word finding impairment or dysarthria.  Pt endorses that he is back to  baseline for speech, language, and cognition.  As a result, no further ST needs are indicated at this time.      SLP Assessment  SLP Recommendation/Assessment: Patient does not need any further Speech Lanaguage Pathology Services    Follow Up Recommendations  None          SLP Evaluation Cognition  Overall Cognitive Status: Within Functional Limits for tasks assessed Orientation Level: Oriented X4       Comprehension  Auditory Comprehension Overall Auditory Comprehension: Appears within functional limits for tasks assessed    Expression Expression Primary Mode of Expression: Verbal Verbal Expression Overall Verbal Expression: Appears within functional limits for tasks assessed Written Expression Dominant Hand: Right   Oral / Motor  Oral Motor/Sensory Function Overall Oral Motor/Sensory Function: Within functional limits Motor Speech Overall Motor Speech: Appears within functional limits for tasks assessed   GO          Functional Assessment Tool Used: cognitive-linguistic evaluation Functional Limitations: Memory Memory Current Status (P5916): 0 percent impaired, limited or restricted Memory Goal Status (B8466): 0 percent impaired, limited or restricted Memory Discharge Status (Z9935): 0 percent impaired, limited or restricted         Emilio Math 01/06/2018, 3:01 PM

## 2018-01-06 NOTE — Evaluation (Signed)
Physical Therapy Evaluation Patient Details Name: Russell [name] MRN: [id number] DOB: June 06, 1927 Today's Date: 01/06/2018   History of Present Illness  84 y.o. male with medical history significant of glaucoma; TIA; syncope;  PVD; HLD; carotid stenosis; macular degeneration; and HTN presenting with possible TIA.  He has been having TIAs for many years and he has never had this sort of evaluation before and so he is frustrated.  "Because I had a TIA, and normally when I have a TIA I wait and it goes away.  But Dr. Bubba Camp wanted me to come in the next time I have a TIA and so I did."  He has a black and right checkerboard in the right lateral part of his vision and word finding difficulty.  He usually exercises 3 times a week and this cognitive impairment is quite different from his baseline.  His symptoms today lasted from about 1030 until about 1200.  Sometimes the symptoms last an hour, sometimes 15 minutes.  The symptoms are consistent with his TIAs.  He has the episodes a couple of times a year.  Clinical Impression  Patient is a very pleasant 84 y/o male admitted with the above listed diagnosis. Prior to admission was independent/Mod I with all aspects of mobility without AD. Patient today able to perform bed mobility, transfers and gait with Mod I to light supervision for general safety. Patient feels as if he is at baseline level of functioning. No acute PT needs identified. PT to sign off. Thanks for the referral.     Follow Up Recommendations No PT follow up    Equipment Recommendations  None recommended by PT    Recommendations for Other Services       Precautions / Restrictions Precautions Precautions: Fall Restrictions Weight Bearing Restrictions: No      Mobility  Bed Mobility Overal bed mobility: Modified Independent             General bed mobility comments: increased time wtih LE management  Transfers Overall transfer level: Modified independent Equipment used:  None             General transfer comment: Mod I to light supervision for safety  Ambulation/Gait Ambulation/Gait assistance: Supervision Gait Distance (Feet): 150 Feet Assistive device: None Gait Pattern/deviations: Step-through pattern;Narrow base of support Gait velocity: normal pace for age/gender   General Gait Details: good obstacle navigation   Stairs Stairs: Yes Stairs assistance: Supervision Stair Management: No rails Number of Stairs: 2    Wheelchair Mobility    Modified Rankin (Stroke Patients Only)       Balance Overall balance assessment: Modified Independent                                           Pertinent Vitals/Pain Pain Assessment: No/denies pain    Home Living Family/patient expects to be discharged to:: Assisted living     Type of Home: Independent living facility         Home Equipment: None Additional Comments: Pt reports he manages his medications and goes to dining room for 1 meal each day. He has an apartment at facility.     Prior Function Level of Independence: Independent               Hand Dominance   Dominant Hand: Right    Extremity/Trunk Assessment   Upper Extremity Assessment Upper Extremity Assessment: Overall  WFL for tasks assessed    Lower Extremity Assessment Lower Extremity Assessment: Overall WFL for tasks assessed    Cervical / Trunk Assessment Cervical / Trunk Assessment: Normal  Communication   Communication: No difficulties  Cognition Arousal/Alertness: Awake/alert Behavior During Therapy: WFL for tasks assessed/performed Overall Cognitive Status: Within Functional Limits for tasks assessed                                        General Comments      Exercises     Assessment/Plan    PT Assessment Patent does not need any further PT services  PT Problem List         PT Treatment Interventions      PT Goals (Current goals can be found in the  Care Plan section)  Acute Rehab PT Goals Patient Stated Goal: to return home PT Goal Formulation: With patient Time For Goal Achievement: 01/13/18 Potential to Achieve Goals: Good    Frequency     Barriers to discharge        Co-evaluation               AM-PAC PT "6 Clicks" Daily Activity  Outcome Measure Difficulty turning over in bed (including adjusting bedclothes, sheets and blankets)?: A Little Difficulty moving from lying on back to sitting on the side of the bed? : A Little Difficulty sitting down on and standing up from a chair with arms (e.g., wheelchair, bedside commode, etc,.)?: A Little Help needed moving to and from a bed to chair (including a wheelchair)?: A Little Help needed walking in hospital room?: A Little Help needed climbing 3-5 steps with a railing? : A Little 6 Click Score: 18    End of Session Equipment Utilized During Treatment: Gait belt Activity Tolerance: Patient tolerated treatment well Patient left: in bed;with call bell/phone within reach Nurse Communication: Mobility status PT Visit Diagnosis: Unsteadiness on feet (R26.81);Other abnormalities of gait and mobility (R26.89);Muscle weakness (generalized) (M62.81)    Time: 7989-2119 PT Time Calculation (min) (ACUTE ONLY): 15 min   Charges:   PT Evaluation $PT Eval Moderate Complexity: 1 Mod     PT G Codes:        Lanney Gins, PT, DPT 01/06/18 3:32 PM

## 2018-01-06 NOTE — Discharge Summary (Signed)
Physician Discharge Summary  Russell [name] TMH:[id number] DOB: 01/05/1927 DOA: 01/05/2018  PCP: Blanchie Serve, MD  Admit date: 01/05/2018 Discharge date: 01/06/2018  Admitted From: Independent Living  Disposition:  Independent Living   Recommendations for Outpatient Follow-up:  1. Follow up with PCP in 1-2 weeks 2. Follow up with Dr. Leonie Man in 4-6 weeks 3. Please obtain lipids in 3 months on new statin  Home Health: None  Equipment/Devices: None  Discharge Condition: Good  CODE STATUS: DO NOT RESUSCITATE Diet recommendation: Cardiac  Brief/Interim Summary: [name] is an 84 y.o. M with HTN, hx TIA who presents with "TIA".  Evidently for years has had stereotypical episodes where vision in his right eye moves like a checkerboard, and he has word finding difficulties.  These episodes last minutes to an hour.  It is never associated with loss of consciousness, headache, focal numbness, weakness, facial droop, gait instability.  He had another episode yesterday, and so he came to the emergency room.    Suspected migraine accompaniment versus possible TIA Patient was evaluated by neurology, MRI MRA showed intracranial stenoses, no culprit lesion.  CTA of the head and neck showed no critical carotid stenosis.  Echocardiogram was unremarkable.  Neurology felt symptoms most consistent with late life migraine accompaniment.  EEG normal.  Hemoglobin A1c was normal.  Given intracranial stenosis and LDL not at goal less than 70, will change to Lipitor.  Hypertension Continue enalapril, goal blood pressure 130/80.   Discharge Diagnoses:  Principal Problem:   TIA (transient ischemic attack) Active Problems:   Essential hypertension   Hyperlipidemia    Discharge Instructions  Discharge Instructions    Ambulatory referral to Neurology   Complete by:  As directed    An appointment is requested in approximately: 2-4 weeks   Diet - low sodium heart healthy   Complete by:  As directed     Discharge instructions   Complete by:  As directed    From Dr. Loleta Books: You were admitted for visual changes and difficulty speaking.  We believe this was related either to "late-life migraine accompaniment" (which refers to a known condition of migraine aura that develops without associated headache) or less likely a TIA.    Your MRIs showed no stroke, but they and your CT scans showed atherosclerosis (fatty plaque buildup) in the arteries to your brain, which certainly predispose to strokes or TIAs.    With that in mind, I agree with Dr. Leonie Man that continuing your full dose aspirin and increasing your statin medicine are valuable.  Stop Lovastatin Start atorvastatin (which is also called Lipitor) 40 mg nightly Continue aspirin 325 mg and your enalapril.  Follow up with Dr. Bubba Camp in the next few months to arrange for a repeat cholesterol testing.   Follow up with Dr. Leonie Man from Neurology (his office will call you for an appointment).   Increase activity slowly   Complete by:  As directed      Allergies as of 01/06/2018   No Known Allergies     Medication List    STOP taking these medications   lovastatin 20 MG tablet Commonly known as:  MEVACOR     TAKE these medications   aspirin 325 MG tablet Take 325 mg by mouth every evening.   atorvastatin 40 MG tablet Commonly known as:  LIPITOR Take 1 tablet (40 mg total) by mouth daily at 6 PM.   dorzolamide 2 % ophthalmic solution Commonly known as:  TRUSOPT Place 1 drop into both eyes  2 (two) times daily.   enalapril 20 MG tablet Commonly known as:  VASOTEC TAKE 1 TABLET TWICE A DAY TO CONTROL BLOOD PRESSURE   Fish Oil 1000 MG Caps Take 1,000 mg by mouth 2 (two) times daily.   loratadine 10 MG tablet Commonly known as:  CLARITIN Take 10 mg by mouth daily.   PRESERVISION AREDS 2 Caps Take 1 capsule by mouth 2 (two) times daily.   timolol 0.5 % ophthalmic solution Commonly known as:  BETIMOL Place 1 drop into both  eyes 2 (two) times daily.   TRAVATAN Z 0.004 % Soln ophthalmic solution Generic drug:  Travoprost (BAK Free) Place 1 drop into the left eye at bedtime. One drop left eye once a day for glaucoma       No Known Allergies  Consultations:  Neurology   Procedures/Studies: Ct Angio Head W Or Wo Contrast  Result Date: 01/05/2018 CLINICAL DATA:  Abnormal vision and word-finding difficulty.  TIA. EXAM: CT ANGIOGRAPHY HEAD AND NECK TECHNIQUE: Multidetector CT imaging of the head and neck was performed using the standard protocol during bolus administration of intravenous contrast. Multiplanar CT image reconstructions and MIPs were obtained to evaluate the vascular anatomy. Carotid stenosis measurements (when applicable) are obtained utilizing NASCET criteria, using the distal internal carotid diameter as the denominator. CONTRAST:  24mL ISOVUE-370 IOPAMIDOL (ISOVUE-370) INJECTION 76% COMPARISON:  Head CT from earlier today FINDINGS: CTA NECK FINDINGS Aortic arch: Atherosclerotic plaque. Brachiocephalic and right common carotid origins are incompletely covered. Right carotid system: Calcified plaque at the common carotid bifurcation primarily narrowing the ECA origin. No ICA flow limiting stenosis or ulceration. The right ICA is small compared to the left due to variant circle-of-Willis. Left carotid system: Mild calcified plaque at the common carotid bifurcation and proximal ICA. No flow limiting stenosis or ulceration. Vertebral arteries: No proximal subclavian flow limiting stenosis. Strong left vertebral artery dominance. Both vertebral arteries are widely patent to the dura. Skeleton: Diffuse degenerative change without acute finding. Other neck: No acute finding Upper chest: Mild centrilobular emphysema. Review of the MIP images confirms the above findings CTA HEAD FINDINGS Anterior circulation: Azygos A2 with aplastic right A1 segment. The left ICA is larger than the right. Calcified plaque on the  carotid siphons with mild to moderate narrowing at the right cavernous anterior genu. Bilateral M2 segment atheromatous narrowings that are moderate to advanced. Multifocal medium-sized ACA branch high-grade narrowings best seen on sagittal thick MIPS. Posterior circulation: The right vertebral artery ends in PICA. A left PICA is also patent. Vertebral and basilar tortuosity. Atheromatous irregularity of bilateral posterior cerebral arteries with moderate to advanced bilateral P2 segment narrowings. Negative for aneurysm. Venous sinuses: Patent on the delayed phase Anatomic variants: As above Delayed phase: No abnormal parenchymal enhancement. Chronic small vessel ischemia in the cerebral white matter and deep gray nuclei. Review of the MIP images confirms the above findings IMPRESSION: 1. No emergent large vessel occlusion. 2. Extensive intracranial atherosclerosis with moderate to advanced narrowing of bilateral M2 and P2 segments. Multifocal advanced narrowing of medium size vessels in the ACA distribution. 3. Atherosclerosis in the neck without flow limiting stenosis. 4.  Aortic Atherosclerosis (ICD10-I70.0). Electronically Signed   By: Monte Fantasia M.D.   On: 01/05/2018 19:33   Dg Chest 2 View  Result Date: 01/05/2018 CLINICAL DATA:  Visual alterations.  Hypertension. EXAM: CHEST - 2 VIEW COMPARISON:  Chest CT December 30, 2016 FINDINGS: No edema or consolidation. Heart is upper normal in size with pulmonary vascularity  normal. No adenopathy. No bone lesions. IMPRESSION: No edema or consolidation. Electronically Signed   By: Lowella Grip III M.D.   On: 01/05/2018 13:23   Ct Head Wo Contrast  Result Date: 01/05/2018 CLINICAL DATA:  Aphasia and altered vision EXAM: CT HEAD WITHOUT CONTRAST TECHNIQUE: Contiguous axial images were obtained from the base of the skull through the vertex without intravenous contrast. COMPARISON:  Dec 17, 2017 FINDINGS: Brain: Moderate diffuse atrophy is stable. Prominence  of the cisterna magna is an anatomic variant. There is no intracranial mass, hemorrhage, extra-axial fluid collection, or midline shift. There is extensive small vessel disease throughout the centra semiovale bilaterally. Small vessel disease is noted in the internal and external capsule regions bilaterally. There is evidence of a prior small infarct in the left thalamus. There is a small lacunar infarct in the inferior right centrum semiovale. There is small vessel disease in the basilar perforator distribution of the mid pons. No acute infarct is demonstrated on this study. Vascular: There is no appreciable hyperdense vessel. There is calcification in each carotid siphon region as well as in the left distal vertebral artery. Skull: Bony calvarium appears intact. There is a left frontal scalp lipoma measuring 3.1 x 0.8 cm. Sinuses/Orbits: There is mucosal thickening in several ethmoid air cells. There are retention cysts in each maxillary antrum. Other paranasal sinuses are clear. Orbits appear symmetric bilaterally. Patient has had cataract extractions bilaterally. Other: Mastoid air cells are clear. IMPRESSION: 1. Atrophy with extensive supratentorial small vessel disease. Occasional prior lacunar infarcts. There is also basilar pontine distribution small vessel disease. No acute infarct evident. No mass or hemorrhage. 2.  Foci of arterial vascular calcification noted. 3.  Areas of paranasal sinus disease. 4.  Left frontal scalp lipoma. Electronically Signed   By: Lowella Grip III M.D.   On: 01/05/2018 13:19   Ct Head W & Wo Contrast  Result Date: 12/17/2017 CLINICAL DATA:  84 year old male with recent TIA symptoms beginning on 12/05/2017 including visual changes and some loss of speech. Creatinine was obtained on site at [facility] at [address] Results: Creatinine 0.8 mg/dL. EXAM: CT HEAD WITHOUT AND WITH CONTRAST TECHNIQUE: Contiguous axial images were obtained from the base of the  skull through the vertex without and with intravenous contrast CONTRAST:  60mL ISOVUE-300 IOPAMIDOL (ISOVUE-300) INJECTION 61% COMPARISON:  Head CT 08/03/2012. FINDINGS: Brain: Mild additional generalized appearing cerebral volume loss since 2014. Suspected ex vacuo related ventricular enlargement has not significantly changed. Widespread scattered cerebral white matter and occasional deep gray matter nuclei hypodensity compatible with chronic small vessel disease. These changes have progressed since 2014. No cortical encephalomalacia identified. No midline shift, ventriculomegaly, mass effect, evidence of mass lesion, intracranial hemorrhage or evidence of cortically based acute infarction. No abnormal enhancement identified. Vascular: Calcified atherosclerosis at the skull base. Intracranial artery dolichoectasia. Dominant distal left vertebral artery. Major intracranial vascular structures are enhancing as expected. Skull: Negative. Sinuses/Orbits: Visualized paranasal sinuses and mastoids are stable and well pneumatized. Other: Benign left forehead scalp lipoma measuring 6 millimeters in thickness (series 3, image 13). Postoperative changes to both globes. Otherwise negative orbit and scalp soft tissues. IMPRESSION: 1. Chronic small vessel disease affecting cerebral white matter and deep gray matter has progressed since 2014. 2. Chronic intracranial artery ectasia and atherosclerosis. 3.  No acute intracranial abnormality identified. Electronically Signed   By: Genevie Ann M.D.   On: 12/17/2017 16:09   Ct Angio Neck W Or Wo Contrast  Result Date: 01/05/2018  CLINICAL DATA:  Abnormal vision and word-finding difficulty.  TIA. EXAM: CT ANGIOGRAPHY HEAD AND NECK TECHNIQUE: Multidetector CT imaging of the head and neck was performed using the standard protocol during bolus administration of intravenous contrast. Multiplanar CT image reconstructions and MIPs were obtained to evaluate the vascular anatomy. Carotid  stenosis measurements (when applicable) are obtained utilizing NASCET criteria, using the distal internal carotid diameter as the denominator. CONTRAST:  28mL ISOVUE-370 IOPAMIDOL (ISOVUE-370) INJECTION 76% COMPARISON:  Head CT from earlier today FINDINGS: CTA NECK FINDINGS Aortic arch: Atherosclerotic plaque. Brachiocephalic and right common carotid origins are incompletely covered. Right carotid system: Calcified plaque at the common carotid bifurcation primarily narrowing the ECA origin. No ICA flow limiting stenosis or ulceration. The right ICA is small compared to the left due to variant circle-of-Willis. Left carotid system: Mild calcified plaque at the common carotid bifurcation and proximal ICA. No flow limiting stenosis or ulceration. Vertebral arteries: No proximal subclavian flow limiting stenosis. Strong left vertebral artery dominance. Both vertebral arteries are widely patent to the dura. Skeleton: Diffuse degenerative change without acute finding. Other neck: No acute finding Upper chest: Mild centrilobular emphysema. Review of the MIP images confirms the above findings CTA HEAD FINDINGS Anterior circulation: Azygos A2 with aplastic right A1 segment. The left ICA is larger than the right. Calcified plaque on the carotid siphons with mild to moderate narrowing at the right cavernous anterior genu. Bilateral M2 segment atheromatous narrowings that are moderate to advanced. Multifocal medium-sized ACA branch high-grade narrowings best seen on sagittal thick MIPS. Posterior circulation: The right vertebral artery ends in PICA. A left PICA is also patent. Vertebral and basilar tortuosity. Atheromatous irregularity of bilateral posterior cerebral arteries with moderate to advanced bilateral P2 segment narrowings. Negative for aneurysm. Venous sinuses: Patent on the delayed phase Anatomic variants: As above Delayed phase: No abnormal parenchymal enhancement. Chronic small vessel ischemia in the cerebral  white matter and deep gray nuclei. Review of the MIP images confirms the above findings IMPRESSION: 1. No emergent large vessel occlusion. 2. Extensive intracranial atherosclerosis with moderate to advanced narrowing of bilateral M2 and P2 segments. Multifocal advanced narrowing of medium size vessels in the ACA distribution. 3. Atherosclerosis in the neck without flow limiting stenosis. 4.  Aortic Atherosclerosis (ICD10-I70.0). Electronically Signed   By: Monte Fantasia M.D.   On: 01/05/2018 19:33   Mr Brain Wo Contrast  Result Date: 01/05/2018 CLINICAL DATA:  Initial evaluation for acute visual changes, word-finding difficulty. EXAM: MRI HEAD WITHOUT CONTRAST MRA HEAD WITHOUT CONTRAST TECHNIQUE: Multiplanar, multiecho pulse sequences of the brain and surrounding structures were obtained without intravenous contrast. Angiographic images of the head were obtained using MRA technique without contrast. COMPARISON:  Prior CT and CTA from earlier the same day. FINDINGS: MRI HEAD FINDINGS Brain: Generalized age-related cerebral atrophy. Patchy and confluent T2/FLAIR hyperintensity within the periventricular and deep white matter both cerebral hemispheres, most consistent with chronic small vessel ischemic change, moderate to advanced in nature. He scattered superimposed remote lacunar infarcts present within the hemispheric cerebral white matter bilaterally. No abnormal foci of restricted diffusion suggest acute or subacute. Gray-white matter differentiation. No evidence for acute intracranial hemorrhage. Few scattered chronic micro hemorrhages noted within the cerebellum and deep gray nuclei, like related to chronic microvascular ischemic disease and/or hypertension. No mass lesion, midline shift or mass effect. Ventricular prominence related to global parenchymal volume loss without hydrocephalus. Extra-axial collection. Pituitary gland within normal limits. Vascular: Major intracranial vascular flow voids  maintained. Skull and upper cervical  spine: Craniocervical junction normal. Upper cervical spine within normal limits. Bone marrow signal intensity normal. Small lipoma noted at the left frontal scalp. Sinuses/Orbits: Globes and orbital soft tissues demonstrate no acute finding. Patient status post lens extraction bilaterally. Small right maxillary sinus retention cyst. No mastoid effusion. Other: None. MRA HEAD FINDINGS ANTERIOR CIRCULATION: Internal carotid arteries patent to the termini without hemodynamically significant stenosis. Aplastic right A1 with widely patent left A1. Azygos A2 without stenosis. Moderate atheromatous irregularity within visualized distal ACA branches. No M1 stenosis or occlusion. Moderate bilateral M2 stenoses with prominent distal atheromatous irregularity throughout the MCA branches. POSTERIOR CIRCULATION: Dominant left vertebral artery widely patent. Hypoplastic right vertebral artery terminates in PICA. Posterior inferior cerebral arteries patent bilaterally. Vertebrobasilar tortuosity. Superior cerebral arteries patent proximally. PCAs supplied via the basilar. Moderate bilateral P2 stenoses, tandem on the left. Distal PCA small vessel atheromatous irregularity. No aneurysm. IMPRESSION: MRI HEAD IMPRESSION: 1. No acute intracranial infarct or other abnormality. 2. Age-related cerebral atrophy with moderate to advanced chronic small vessel ischemic disease. MRA HEAD IMPRESSION: 1. Negative MRA for large vessel occlusion. 2. Extensive intracranial atherosclerosis with moderate bilateral M2 and P2 stenoses. 3. Aplastic right A1. Azygos A2 supplied via the left carotid artery system. Electronically Signed   By: Jeannine Boga M.D.   On: 01/05/2018 20:06   Mr Jodene Nam Head Wo Contrast  Result Date: 01/05/2018 CLINICAL DATA:  Initial evaluation for acute visual changes, word-finding difficulty. EXAM: MRI HEAD WITHOUT CONTRAST MRA HEAD WITHOUT CONTRAST TECHNIQUE: Multiplanar,  multiecho pulse sequences of the brain and surrounding structures were obtained without intravenous contrast. Angiographic images of the head were obtained using MRA technique without contrast. COMPARISON:  Prior CT and CTA from earlier the same day. FINDINGS: MRI HEAD FINDINGS Brain: Generalized age-related cerebral atrophy. Patchy and confluent T2/FLAIR hyperintensity within the periventricular and deep white matter both cerebral hemispheres, most consistent with chronic small vessel ischemic change, moderate to advanced in nature. He scattered superimposed remote lacunar infarcts present within the hemispheric cerebral white matter bilaterally. No abnormal foci of restricted diffusion suggest acute or subacute. Gray-white matter differentiation. No evidence for acute intracranial hemorrhage. Few scattered chronic micro hemorrhages noted within the cerebellum and deep gray nuclei, like related to chronic microvascular ischemic disease and/or hypertension. No mass lesion, midline shift or mass effect. Ventricular prominence related to global parenchymal volume loss without hydrocephalus. Extra-axial collection. Pituitary gland within normal limits. Vascular: Major intracranial vascular flow voids maintained. Skull and upper cervical spine: Craniocervical junction normal. Upper cervical spine within normal limits. Bone marrow signal intensity normal. Small lipoma noted at the left frontal scalp. Sinuses/Orbits: Globes and orbital soft tissues demonstrate no acute finding. Patient status post lens extraction bilaterally. Small right maxillary sinus retention cyst. No mastoid effusion. Other: None. MRA HEAD FINDINGS ANTERIOR CIRCULATION: Internal carotid arteries patent to the termini without hemodynamically significant stenosis. Aplastic right A1 with widely patent left A1. Azygos A2 without stenosis. Moderate atheromatous irregularity within visualized distal ACA branches. No M1 stenosis or occlusion. Moderate  bilateral M2 stenoses with prominent distal atheromatous irregularity throughout the MCA branches. POSTERIOR CIRCULATION: Dominant left vertebral artery widely patent. Hypoplastic right vertebral artery terminates in PICA. Posterior inferior cerebral arteries patent bilaterally. Vertebrobasilar tortuosity. Superior cerebral arteries patent proximally. PCAs supplied via the basilar. Moderate bilateral P2 stenoses, tandem on the left. Distal PCA small vessel atheromatous irregularity. No aneurysm. IMPRESSION: MRI HEAD IMPRESSION: 1. No acute intracranial infarct or other abnormality. 2. Age-related cerebral atrophy with moderate to advanced chronic  small vessel ischemic disease. MRA HEAD IMPRESSION: 1. Negative MRA for large vessel occlusion. 2. Extensive intracranial atherosclerosis with moderate bilateral M2 and P2 stenoses. 3. Aplastic right A1. Azygos A2 supplied via the left carotid artery system. Electronically Signed   By: Jeannine Boga M.D.   On: 01/05/2018 20:06   EEG  Description: The patient is awake and asleep during the recording.  During maximal wakefulness, there is a symmetric, medium voltage 8 Hz posterior dominant rhythm that attenuates with eye opening.  The record is symmetric.  During drowsiness and sleep, there is an increase in theta slowing of the background.  Vertex waves and symmetric sleep spindles were seen.  Hyperventilation and photic stimulation did not elicit any abnormalities.  There were no epileptiform discharges or electrographic seizures seen.    EKG lead was unremarkable.  Impression: This awake and asleep EEG is normal.    Clinical Correlation: A normal EEG does not exclude a clinical diagnosis of epilepsy. Clinical correlation is advised.     Echocardiogram Study Conclusions  - Left ventricle: The cavity size was normal. Wall thickness was   increased in a pattern of mild LVH. Systolic function was normal.   The estimated ejection fraction was in  the range of 60% to 65%.   Wall motion was normal; there were no regional wall motion   abnormalities. Doppler parameters are consistent with abnormal   left ventricular relaxation (grade 1 diastolic dysfunction). - Aortic valve: There was no stenosis. There was trivial   regurgitation. - Aorta: Aortic root dimension: 43 mm (ED). - Aortic root: The aortic root was mildly dilated. - Mitral valve: Mildly to moderately calcified annulus. There was   trivial regurgitation. - Right ventricle: The cavity size was normal. Systolic function   was normal. - Tricuspid valve: Peak RV-RA gradient (S): 36 mm Hg. - Pulmonary arteries: PA peak pressure: 39 mm Hg (S). - Inferior vena cava: The vessel was normal in size. The   respirophasic diameter changes were in the normal range (>= 50%),   consistent with normal central venous pressure.  Impressions:  - Normal LV size with mild LV hypertrophy. EF 60-65%. Normal RV   size and systolic function. No significant valvular   abnormalities.     Subjective: Feels well.  No visual changes, no headache, no weakness, slurred speech, confusion, fever.  Discharge Exam: Vitals:   01/06/18 0813 01/06/18 1213  BP: (!) 148/84 139/64  Pulse: 78 62  Resp: 18 20  Temp: 98.6 F (37 C) 98.3 F (36.8 C)  SpO2: 96% 99%   Vitals:   01/06/18 0205 01/06/18 0339 01/06/18 0813 01/06/18 1213  BP: 134/66 134/87 (!) 148/84 139/64  Pulse: (!) 58 63 78 62  Resp: 16 20 18 20   Temp:  98 F (36.7 C) 98.6 F (37 C) 98.3 F (36.8 C)  TempSrc:  Oral    SpO2: 95% 97% 96% 99%  Weight:      Height:        General: Pt is alert, awake, not in acute distress Cardiovascular: RRR, S1/S2 +, no rubs, no gallops Respiratory: CTA bilaterally, no wheezing, no rhonchi Abdominal: Soft, NT, ND, bowel sounds + Extremities: no edema, no cyanosis    The results of significant diagnostics from this hospitalization (including imaging, microbiology, ancillary and  laboratory) are listed below for reference.     Microbiology: No results found for this or any previous visit (from the past 240 hour(s)).   Labs: BNP (last 3 results)  No results for input(s): BNP in the last 8760 hours. Basic Metabolic Panel: Recent Labs  Lab 01/05/18 1201 01/05/18 1234  NA 136 139  K 3.7 3.9  CL 104 101  CO2 25  --   GLUCOSE 89 83  BUN 14 15  CREATININE 0.79 0.80  CALCIUM 8.7*  --    Liver Function Tests: Recent Labs  Lab 01/05/18 1201  AST 28  ALT 24  ALKPHOS 48  BILITOT 1.1  PROT 5.9*  ALBUMIN 3.7   No results for input(s): LIPASE, AMYLASE in the last 168 hours. No results for input(s): AMMONIA in the last 168 hours. CBC: Recent Labs  Lab 01/05/18 1201 01/05/18 1234  WBC 7.6  --   NEUTROABS 4.5  --   HGB 15.1 13.9  HCT 44.8 41.0  MCV 88.7  --   PLT 152  --    Cardiac Enzymes: No results for input(s): CKTOTAL, CKMB, CKMBINDEX, TROPONINI in the last 168 hours. BNP: Invalid input(s): POCBNP CBG: No results for input(s): GLUCAP in the last 168 hours. D-Dimer No results for input(s): DDIMER in the last 72 hours. Hgb A1c No results for input(s): HGBA1C in the last 72 hours. Lipid Profile No results for input(s): CHOL, HDL, LDLCALC, TRIG, CHOLHDL, LDLDIRECT in the last 72 hours. Thyroid function studies No results for input(s): TSH, T4TOTAL, T3FREE, THYROIDAB in the last 72 hours.  Invalid input(s): FREET3 Anemia work up No results for input(s): VITAMINB12, FOLATE, FERRITIN, TIBC, IRON, RETICCTPCT in the last 72 hours. Urinalysis    Component Value Date/Time   COLORURINE COLORLESS (A) 01/05/2018 1201   APPEARANCEUR CLEAR 01/05/2018 1201   LABSPEC 1.002 (L) 01/05/2018 1201   PHURINE 8.0 01/05/2018 1201   GLUCOSEU NEGATIVE 01/05/2018 1201   HGBUR MODERATE (A) 01/05/2018 1201   BILIRUBINUR NEGATIVE 01/05/2018 1201   KETONESUR NEGATIVE 01/05/2018 1201   PROTEINUR NEGATIVE 01/05/2018 1201   NITRITE NEGATIVE 01/05/2018 1201    LEUKOCYTESUR NEGATIVE 01/05/2018 1201   Sepsis Labs Invalid input(s): PROCALCITONIN,  WBC,  LACTICIDVEN Microbiology No results found for this or any previous visit (from the past 240 hour(s)).   Time coordinating discharge: 23 minutes       SIGNED:   Edwin Dada, MD  [facility] 01/06/2018, 6:46 PM

## 2018-01-06 NOTE — Discharge Instructions (Signed)
Stroke Prevention °Some medical conditions and behaviors are associated with a higher chance of having a stroke. You can help prevent a stroke by making nutrition, lifestyle, and other changes, including managing any medical conditions you may have. °What nutrition changes can be made? °· Eat healthy foods. You can do this by: °? Choosing foods high in fiber, such as fresh fruits and vegetables and whole grains. °? Eating at least 5 or more servings of fruits and vegetables a day. Try to fill half of your plate at each meal with fruits and vegetables. °? Choosing lean protein foods, such as lean cuts of meat, poultry without skin, fish, tofu, beans, and nuts. °? Eating low-fat dairy products. °? Avoiding foods that are high in salt (sodium). This can help lower blood pressure. °? Avoiding foods that have saturated fat, trans fat, and cholesterol. This can help prevent high cholesterol. °? Avoiding processed and premade foods. °· Follow your health care provider's specific guidelines for losing weight, controlling high blood pressure (hypertension), lowering high cholesterol, and managing diabetes. These may include: °? Reducing your daily calorie intake. °? Limiting your daily sodium intake to 1,500 milligrams (mg). °? Using only healthy fats for cooking, such as olive oil, canola oil, or sunflower oil. °? Counting your daily carbohydrate intake. °What lifestyle changes can be made? °· Maintain a healthy weight. Talk to your health care provider about your ideal weight. °· Get at least 30 minutes of moderate physical activity at least 5 days a week. Moderate activity includes brisk walking, biking, and swimming. °· Do not use any products that contain nicotine or tobacco, such as cigarettes and e-cigarettes. If you need help quitting, ask your health care provider. It may also be helpful to avoid exposure to secondhand smoke. °· Limit alcohol intake to no more than 1 drink a day for nonpregnant women and 2 drinks a  day for men. One drink equals 12 oz of beer, 5 oz of wine, or 1½ oz of hard liquor. °· Stop any illegal drug use. °· Avoid taking birth control pills. Talk to your health care provider about the risks of taking birth control pills if: °? You are over 35 years old. °? You smoke. °? You get migraines. °? You have ever had a blood clot. °What other changes can be made? °· Manage your cholesterol levels. °? Eating a healthy diet is important for preventing high cholesterol. If cholesterol cannot be managed through diet alone, you may also need to take medicines. °? Take any prescribed medicines to control your cholesterol as told by your health care provider. °· Manage your diabetes. °? Eating a healthy diet and exercising regularly are important parts of managing your blood sugar. If your blood sugar cannot be managed through diet and exercise, you may need to take medicines. °? Take any prescribed medicines to control your diabetes as told by your health care provider. °· Control your hypertension. °? To reduce your risk of stroke, try to keep your blood pressure below 130/80. °? Eating a healthy diet and exercising regularly are an important part of controlling your blood pressure. If your blood pressure cannot be managed through diet and exercise, you may need to take medicines. °? Take any prescribed medicines to control hypertension as told by your health care provider. °? Ask your health care provider if you should monitor your blood pressure at home. °? Have your blood pressure checked every year, even if your blood pressure is normal. Blood pressure increases with   age and some medical conditions.  Get evaluated for sleep disorders (sleep apnea). Talk to your health care provider about getting a sleep evaluation if you snore a lot or have excessive sleepiness.  Take over-the-counter and prescription medicines only as told by your health care provider. Aspirin or blood thinners (antiplatelets or  anticoagulants) may be recommended to reduce your risk of forming blood clots that can lead to stroke.  Make sure that any other medical conditions you have, such as atrial fibrillation or atherosclerosis, are managed. What are the warning signs of a stroke? The warning signs of a stroke can be easily remembered as BEFAST.  B is for balance. Signs include: ? Dizziness. ? Loss of balance or coordination. ? Sudden trouble walking.  E is for eyes. Signs include: ? A sudden change in vision. ? Trouble seeing.  F is for face. Signs include: ? Sudden weakness or numbness of the face. ? The face or eyelid drooping to one side.  A is for arms. Signs include: ? Sudden weakness or numbness of the arm, usually on one side of the body.  S is for speech. Signs include: ? Trouble speaking (aphasia). ? Trouble understanding.  T is for time. ? These symptoms may represent a serious problem that is an emergency. Do not wait to see if the symptoms will go away. Get medical help right away. Call your local emergency services (911 in the [place] Do not drive yourself to the hospital.  Other signs of stroke may include: ? A sudden, severe headache with no known cause. ? Nausea or vomiting. ? Seizure.  Where to find more information: For more information, visit:  American Stroke Association: www.strokeassociation.org  National Stroke Association: www.stroke.org  Summary  You can prevent a stroke by eating healthy, exercising, not smoking, limiting alcohol intake, and managing any medical conditions you may have.  Do not use any products that contain nicotine or tobacco, such as cigarettes and e-cigarettes. If you need help quitting, ask your health care provider. It may also be helpful to avoid exposure to secondhand smoke.  Remember BEFAST for warning signs of stroke. Get help right away if you or a loved one has any of these signs. This information is not intended to replace advice given  to you by your health care provider. Make sure you discuss any questions you have with your health care provider. Document Released: 08/13/2004 Document Revised: 08/11/2016 Document Reviewed: 08/11/2016 Elsevier Interactive Patient Education  2018 [place].   Transient Ischemic Attack A transient ischemic attack (TIA) is a "warning stroke" that causes stroke-like symptoms. A TIA does not cause lasting damage to the brain. The symptoms of a TIA can happen fast and do not last long. It is important to know the symptoms of a TIA and what to do. This can help prevent stroke or death. Follow these instructions at home:  Take medicines only as told by your doctor. Make sure you understand all of the instructions.  You may need to take aspirin or warfarin medicine. Warfarin needs to be taken exactly as told. ? Taking too much or too little warfarin is dangerous. Blood tests must be done as often as told by your doctor. A PT blood test measures how long it takes for blood to clot. Your PT is used to calculate another value called an INR. Your PT and INR help your doctor adjust your warfarin dosage. He or she will make sure you are taking the right amount. ? Food  can cause problems with warfarin and affect the results of your blood tests. This is true for foods high in vitamin K. Eat the same amount of foods high in vitamin K each day. Foods high in vitamin K include spinach, kale, broccoli, cabbage, collard and turnip greens, Brussels sprouts, peas, cauliflower, seaweed, and parsley. Other foods high in vitamin K include beef and pork liver, green tea, and soybean oil. Eat the same amount of foods high in vitamin K each day. Avoid big changes in your diet. Tell your doctor before changing your diet. Talk to a food specialist (dietitian) if you have questions. ? Many medicines can cause problems with warfarin and affect your PT and INR. Tell your doctor about all medicines you take. This includes vitamins  and dietary pills (supplements). Do not take or stop taking any prescribed or over-the-counter medicines unless your doctor tells you to. ? Warfarin can cause more bruising or bleeding. Hold pressure over any cuts for longer than normal. Talk to your doctor about other side effects of warfarin. ? Avoid sports or activities that may cause injury or bleeding. ? Be careful when you shave, floss, or use sharp objects. ? Avoid or drink very little alcohol while taking warfarin. Tell your doctor if you change how much alcohol you drink. ? Tell your dentist and other doctors that you take warfarin before any procedures.  Follow your diet program as told, if you are given one.  Keep a healthy weight.  Stay active. Try to get at least 30 minutes of activity on all or most days.  Do not use any tobacco products, including cigarettes, chewing tobacco, or electronic cigarettes. If you need help quitting, ask your doctor.  Limit alcohol intake to no more than 1 drink per day for nonpregnant women and 2 drinks per day for men. One drink equals 12 ounces of beer, 5 ounces of wine, or 1 ounces of hard liquor.  Do not abuse drugs.  Keep your home safe so you do not fall. You can do this by: ? Putting grab bars in the bedroom and bathroom. ? Raising toilet seats. ? Putting a seat in the shower.  Keep all follow-up visits as told by your doctor. This is important. Contact a doctor if:  Your personality changes.  You have trouble swallowing.  You have double vision.  You are dizzy.  You have a fever. Get help right away if: These symptoms may be an emergency. Do not wait to see if the symptoms will go away. Get medical help right away. Call your local emergency services (911 in the [place] Do not drive yourself to the hospital.  You have sudden weakness or lose feeling (go numb), especially on one side of the body. This can affect your: ? Face. ? Arm. ? Leg.  You have sudden trouble  walking.  You have sudden trouble moving your arms or legs.  You have sudden confusion.  You have trouble talking.  You have trouble understanding.  You have sudden trouble seeing in one or both eyes.  You lose your balance.  Your movements are not smooth.  You have a sudden, very bad headache with no known cause.  You have new chest pain.  Your heartbeat is unsteady.  You are partly or totally unaware of what is going on around you.  This information is not intended to replace advice given to you by your health care provider. Make sure you discuss any questions you have with your  health care provider. Document Released: 04/14/2008 Document Revised: 03/09/2016 Document Reviewed: 10/11/2013 Elsevier Interactive Patient Education  [employer].

## 2018-01-06 NOTE — Progress Notes (Signed)
  Echocardiogram 2D Echocardiogram has been performed.  Russell [name] 01/06/2018, 1:59 PM

## 2018-01-06 NOTE — ED Provider Notes (Signed)
Provider Note   CSN: [id number] Arrival date & time: 01/05/18  1130     History   Chief Complaint Chief Complaint  Patient presents with  . Stroke Symptoms    HPI Baylen Buckner is a 84 y.o. male.  The history is provided by the patient and medical records.  Neurologic Problem  This is a recurrent problem. The current episode started less than 1 hour ago. The problem occurs rarely. The problem has been resolved. Pertinent negatives include no chest pain, no abdominal pain and no headaches. Nothing aggravates the symptoms. Nothing relieves the symptoms. He has tried nothing for the symptoms.    Past Medical History:  Diagnosis Date  . Actinic keratosis 10/18/2012  . 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   . Atrophy, Fuchs' 12/10/2011  . 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 Cervicalgia 04/25/2013  . 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 Cyclitis, chronic 11/18/2012  . Deafness 10/18/2012  . Endothelial corneal dystrophy   . Essential hypertension 10/18/2012  . Hammer toe 04/24/2014  . Hyperlipidemia 10/18/2012  . Intermediate stage nonexudative age-related macular degeneration of both eyes 04/16/2016  . Nocturia 05/19/2016  . Occlusion and stenosis of carotid artery with cerebral infarction   . Other specified cardiac dysrhythmias(427.89)   . Pain in joint, pelvic region and thigh   . Paresthesia 04/30/2015   Toes of both feet   . Peripheral vascular disease, unspecified ([place])   . Primary open angle glaucoma 09/25/2011  . 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  . Rosacea   . Seborrheic keratosis 04/24/2014  . Syncope 06/27/2013  . TIA (transient ischemic attack) 10/18/2012   States he has had about 5  times  . Urine frequency     Patient Active Problem List   Diagnosis Date Noted  . TIA (transient ischemic attack) 01/05/2018  . History of TIA (transient ischemic attack) 05/03/2017  . Nocturia 05/19/2016  . Intermediate stage  nonexudative age-related macular degeneration of both eyes 04/16/2016  . Pain in joint, shoulder region 10/29/2015  . 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  . Paresthesia 04/30/2015  . Anisocoria 10/30/2014  . Hammer toe 04/24/2014  . Pain in knee 04/24/2014  . Seborrheic keratosis 04/24/2014  . Cervicalgia 04/25/2013  . 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 Cyclitis, chronic 11/18/2012  . Essential hypertension 10/18/2012  . Hyperlipidemia 10/18/2012  . 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 Pain in joint, pelvic region and thigh 10/18/2012  . Other specified cardiac dysrhythmias(427.89) 10/18/2012  . 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 Rosacea 10/18/2012  . Actinic keratosis 10/18/2012  . 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 Atrophy, Fuchs' 12/10/2011  . Primary open angle glaucoma 09/25/2011    Past Surgical History:  Procedure Laterality Date  . CATARACT EXTRACTION  2005   bilateral  . CORNEAL TRANSPLANT  2010 left; 2013 right eye  . HERNIA REPAIR  1985   Left  . HERNIA REPAIR  03/2004   Right  . SHOULDER DEBRIDEMENT Left 2005  . TONSILLECTOMY Bilateral childhood  . TRANSURETHRAL RESECTION OF PROSTATE N/A 10/2003  . VASECTOMY          Home Medications    Prior to Admission medications   Medication Sig Start Date End Date Taking? Authorizing Provider  aspirin 325 MG tablet Take 325 mg by mouth every evening.   Yes [provider]  dorzolamide (TRUSOPT) 2 % ophthalmic solution Place 1 drop into both eyes 2 (two) times  daily.   Yes [provider]  enalapril (VASOTEC) 20 MG tablet TAKE 1 TABLET TWICE A DAY TO CONTROL BLOOD PRESSURE 10/25/17  Yes Blanchie Serve, MD  loratadine (CLARITIN) 10 MG tablet Take 10 mg by mouth daily.   Yes [provider]  lovastatin (MEVACOR) 20 MG tablet Take 1 tablet (20 mg total) by mouth at bedtime. 12/25/16  Yes Reed, Tiffany L, DO  Multiple Vitamins-Minerals (PRESERVISION AREDS 2)  CAPS Take 1 capsule by mouth 2 (two) times daily.   Yes [provider]  Omega-3 Fatty Acids (FISH OIL) 1000 MG CAPS Take 1,000 mg by mouth 2 (two) times daily.    Yes [provider]  timolol (BETIMOL) 0.5 % ophthalmic solution Place 1 drop into both eyes 2 (two) times daily.   Yes [provider]  Travoprost, BAK Free, (TRAVATAN Z) 0.004 % SOLN ophthalmic solution Place 1 drop into the left eye at bedtime. One drop left eye once a day for glaucoma    Yes [provider]    Family History Family History  Problem Relation Age of Onset  . Cancer Mother   . Heart disease Father     Social History Social History   Tobacco Use  . Smoking status: Former Smoker    Packs/day: 1.00    Years: 25.00    Pack years: 25.00    Last attempt to quit: 10/18/1964    Years since quitting: 53.2  . Smokeless tobacco: Never Used  . Tobacco comment: quit 1966  Substance Use Topics  . Alcohol use: No    Alcohol/week: 0.6 oz    Types: 1 Glasses of wine per week    Frequency: Never  . Drug use: No     Allergies   Patient has no known allergies.   Review of Systems Review of Systems  Cardiovascular: Negative for chest pain.  Gastrointestinal: Negative for abdominal pain.  Neurological: Negative for headaches.  All other systems reviewed and are negative.    Physical Exam Updated Vital Signs BP 134/87 (BP Location: Right Arm)   Pulse 63   Temp 98 F (36.7 C) (Oral)   Resp 20   Ht 5\' 8"  (1.727 m)   Wt 70.3 kg (155 lb)   SpO2 97%   BMI 23.57 kg/m   Physical Exam  Constitutional: He is oriented to person, place, and time. He appears well-developed and well-nourished.  HENT:  Head: Normocephalic and atraumatic.  Eyes: Conjunctivae and EOM are normal. Left pupil is not reactive. Pupils are unequal (L>R).  Neck: Normal range of motion.  Cardiovascular: Normal rate.  Pulmonary/Chest: Effort normal. No respiratory distress.  Abdominal: Soft. He  exhibits no distension.  Musculoskeletal: Normal range of motion. He exhibits no edema or deformity.  Neurological: He is alert and oriented to person, place, and time. No cranial nerve deficit. Coordination normal.  Skin: Skin is warm and dry.  Nursing note and vitals reviewed.    ED Treatments / Results  Labs (all labs ordered are listed, but only abnormal results are displayed) Labs Reviewed  APTT - Abnormal; Notable for the following components:      Result Value   aPTT 37 (*)    All other components within normal limits  COMPREHENSIVE METABOLIC PANEL - Abnormal; Notable for the following components:   Calcium 8.7 (*)    Total Protein 5.9 (*)    All other components within normal limits  RAPID URINE DRUG SCREEN, HOSP PERFORMED - Abnormal; Notable for the  following components:   Barbiturates   (*)    Value: Result not available. Reagent lot number recalled by manufacturer.   All other components within normal limits  URINALYSIS, ROUTINE W REFLEX MICROSCOPIC - Abnormal; Notable for the following components:   Color, Urine COLORLESS (*)    Specific Gravity, Urine 1.002 (*)    Hgb urine dipstick MODERATE (*)    All other components within normal limits  ETHANOL  PROTIME-INR  CBC  DIFFERENTIAL  SEDIMENTATION RATE  C-REACTIVE PROTEIN  I-STAT CHEM 8, ED  I-STAT TROPONIN, ED    EKG None  Radiology Ct Angio Head W Or Wo Contrast  Result Date: 01/05/2018 CLINICAL DATA:  Abnormal vision and word-finding difficulty.  TIA. EXAM: CT ANGIOGRAPHY HEAD AND NECK TECHNIQUE: Multidetector CT imaging of the head and neck was performed using the standard protocol during bolus administration of intravenous contrast. Multiplanar CT image reconstructions and MIPs were obtained to evaluate the vascular anatomy. Carotid stenosis measurements (when applicable) are obtained utilizing NASCET criteria, using the distal internal carotid diameter as the denominator. CONTRAST:  32mL ISOVUE-370  IOPAMIDOL (ISOVUE-370) INJECTION 76% COMPARISON:  Head CT from earlier today FINDINGS: CTA NECK FINDINGS Aortic arch: Atherosclerotic plaque. Brachiocephalic and right common carotid origins are incompletely covered. Right carotid system: Calcified plaque at the common carotid bifurcation primarily narrowing the ECA origin. No ICA flow limiting stenosis or ulceration. The right ICA is small compared to the left due to variant circle-of-Willis. Left carotid system: Mild calcified plaque at the common carotid bifurcation and proximal ICA. No flow limiting stenosis or ulceration. Vertebral arteries: No proximal subclavian flow limiting stenosis. Strong left vertebral artery dominance. Both vertebral arteries are widely patent to the dura. Skeleton: Diffuse degenerative change without acute finding. Other neck: No acute finding Upper chest: Mild centrilobular emphysema. Review of the MIP images confirms the above findings CTA HEAD FINDINGS Anterior circulation: Azygos A2 with aplastic right A1 segment. The left ICA is larger than the right. Calcified plaque on the carotid siphons with mild to moderate narrowing at the right cavernous anterior genu. Bilateral M2 segment atheromatous narrowings that are moderate to advanced. Multifocal medium-sized ACA branch high-grade narrowings best seen on sagittal thick MIPS. Posterior circulation: The right vertebral artery ends in PICA. A left PICA is also patent. Vertebral and basilar tortuosity. Atheromatous irregularity of bilateral posterior cerebral arteries with moderate to advanced bilateral P2 segment narrowings. Negative for aneurysm. Venous sinuses: Patent on the delayed phase Anatomic variants: As above Delayed phase: No abnormal parenchymal enhancement. Chronic small vessel ischemia in the cerebral white matter and deep gray nuclei. Review of the MIP images confirms the above findings IMPRESSION: 1. No emergent large vessel occlusion. 2. Extensive intracranial  atherosclerosis with moderate to advanced narrowing of bilateral M2 and P2 segments. Multifocal advanced narrowing of medium size vessels in the ACA distribution. 3. Atherosclerosis in the neck without flow limiting stenosis. 4.  Aortic Atherosclerosis (ICD10-I70.0). Electronically Signed   By: Monte Fantasia M.D.   On: 01/05/2018 19:33   Dg Chest 2 View  Result Date: 01/05/2018 CLINICAL DATA:  Visual alterations.  Hypertension. EXAM: CHEST - 2 VIEW COMPARISON:  Chest CT December 30, 2016 FINDINGS: No edema or consolidation. Heart is upper normal in size with pulmonary vascularity normal. No adenopathy. No bone lesions. IMPRESSION: No edema or consolidation. Electronically Signed   By: Lowella Grip III M.D.   On: 01/05/2018 13:23   Ct Head Wo Contrast  Result Date: 01/05/2018 CLINICAL DATA:  Aphasia and  altered vision EXAM: CT HEAD WITHOUT CONTRAST TECHNIQUE: Contiguous axial images were obtained from the base of the skull through the vertex without intravenous contrast. COMPARISON:  Dec 17, 2017 FINDINGS: Brain: Moderate diffuse atrophy is stable. Prominence of the cisterna magna is an anatomic variant. There is no intracranial mass, hemorrhage, extra-axial fluid collection, or midline shift. There is extensive small vessel disease throughout the centra semiovale bilaterally. Small vessel disease is noted in the internal and external capsule regions bilaterally. There is evidence of a prior small infarct in the left thalamus. There is a small lacunar infarct in the inferior right centrum semiovale. There is small vessel disease in the basilar perforator distribution of the mid pons. No acute infarct is demonstrated on this study. Vascular: There is no appreciable hyperdense vessel. There is calcification in each carotid siphon region as well as in the left distal vertebral artery. Skull: Bony calvarium appears intact. There is a left frontal scalp lipoma measuring 3.1 x 0.8 cm. Sinuses/Orbits: There is  mucosal thickening in several ethmoid air cells. There are retention cysts in each maxillary antrum. Other paranasal sinuses are clear. Orbits appear symmetric bilaterally. Patient has had cataract extractions bilaterally. Other: Mastoid air cells are clear. IMPRESSION: 1. Atrophy with extensive supratentorial small vessel disease. Occasional prior lacunar infarcts. There is also basilar pontine distribution small vessel disease. No acute infarct evident. No mass or hemorrhage. 2.  Foci of arterial vascular calcification noted. 3.  Areas of paranasal sinus disease. 4.  Left frontal scalp lipoma. Electronically Signed   By: Lowella Grip III M.D.   On: 01/05/2018 13:19   Ct Angio Neck W Or Wo Contrast  Result Date: 01/05/2018 CLINICAL DATA:  Abnormal vision and word-finding difficulty.  TIA. EXAM: CT ANGIOGRAPHY HEAD AND NECK TECHNIQUE: Multidetector CT imaging of the head and neck was performed using the standard protocol during bolus administration of intravenous contrast. Multiplanar CT image reconstructions and MIPs were obtained to evaluate the vascular anatomy. Carotid stenosis measurements (when applicable) are obtained utilizing NASCET criteria, using the distal internal carotid diameter as the denominator. CONTRAST:  27mL ISOVUE-370 IOPAMIDOL (ISOVUE-370) INJECTION 76% COMPARISON:  Head CT from earlier today FINDINGS: CTA NECK FINDINGS Aortic arch: Atherosclerotic plaque. Brachiocephalic and right common carotid origins are incompletely covered. Right carotid system: Calcified plaque at the common carotid bifurcation primarily narrowing the ECA origin. No ICA flow limiting stenosis or ulceration. The right ICA is small compared to the left due to variant circle-of-Willis. Left carotid system: Mild calcified plaque at the common carotid bifurcation and proximal ICA. No flow limiting stenosis or ulceration. Vertebral arteries: No proximal subclavian flow limiting stenosis. Strong left vertebral artery  dominance. Both vertebral arteries are widely patent to the dura. Skeleton: Diffuse degenerative change without acute finding. Other neck: No acute finding Upper chest: Mild centrilobular emphysema. Review of the MIP images confirms the above findings CTA HEAD FINDINGS Anterior circulation: Azygos A2 with aplastic right A1 segment. The left ICA is larger than the right. Calcified plaque on the carotid siphons with mild to moderate narrowing at the right cavernous anterior genu. Bilateral M2 segment atheromatous narrowings that are moderate to advanced. Multifocal medium-sized ACA branch high-grade narrowings best seen on sagittal thick MIPS. Posterior circulation: The right vertebral artery ends in PICA. A left PICA is also patent. Vertebral and basilar tortuosity. Atheromatous irregularity of bilateral posterior cerebral arteries with moderate to advanced bilateral P2 segment narrowings. Negative for aneurysm. Venous sinuses: Patent on the delayed phase Anatomic variants: As above  Delayed phase: No abnormal parenchymal enhancement. Chronic small vessel ischemia in the cerebral white matter and deep gray nuclei. Review of the MIP images confirms the above findings IMPRESSION: 1. No emergent large vessel occlusion. 2. Extensive intracranial atherosclerosis with moderate to advanced narrowing of bilateral M2 and P2 segments. Multifocal advanced narrowing of medium size vessels in the ACA distribution. 3. Atherosclerosis in the neck without flow limiting stenosis. 4.  Aortic Atherosclerosis (ICD10-I70.0). Electronically Signed   By: Monte Fantasia M.D.   On: 01/05/2018 19:33   Mr Brain Wo Contrast  Result Date: 01/05/2018 CLINICAL DATA:  Initial evaluation for acute visual changes, word-finding difficulty. EXAM: MRI HEAD WITHOUT CONTRAST MRA HEAD WITHOUT CONTRAST TECHNIQUE: Multiplanar, multiecho pulse sequences of the brain and surrounding structures were obtained without intravenous contrast. Angiographic images  of the head were obtained using MRA technique without contrast. COMPARISON:  Prior CT and CTA from earlier the same day. FINDINGS: MRI HEAD FINDINGS Brain: Generalized age-related cerebral atrophy. Patchy and confluent T2/FLAIR hyperintensity within the periventricular and deep white matter both cerebral hemispheres, most consistent with chronic small vessel ischemic change, moderate to advanced in nature. He scattered superimposed remote lacunar infarcts present within the hemispheric cerebral white matter bilaterally. No abnormal foci of restricted diffusion suggest acute or subacute. Gray-white matter differentiation. No evidence for acute intracranial hemorrhage. Few scattered chronic micro hemorrhages noted within the cerebellum and deep gray nuclei, like related to chronic microvascular ischemic disease and/or hypertension. No mass lesion, midline shift or mass effect. Ventricular prominence related to global parenchymal volume loss without hydrocephalus. Extra-axial collection. Pituitary gland within normal limits. Vascular: Major intracranial vascular flow voids maintained. Skull and upper cervical spine: Craniocervical junction normal. Upper cervical spine within normal limits. Bone marrow signal intensity normal. Small lipoma noted at the left frontal scalp. Sinuses/Orbits: Globes and orbital soft tissues demonstrate no acute finding. Patient status post lens extraction bilaterally. Small right maxillary sinus retention cyst. No mastoid effusion. Other: None. MRA HEAD FINDINGS ANTERIOR CIRCULATION: Internal carotid arteries patent to the termini without hemodynamically significant stenosis. Aplastic right A1 with widely patent left A1. Azygos A2 without stenosis. Moderate atheromatous irregularity within visualized distal ACA branches. No M1 stenosis or occlusion. Moderate bilateral M2 stenoses with prominent distal atheromatous irregularity throughout the MCA branches. POSTERIOR CIRCULATION: Dominant left  vertebral artery widely patent. Hypoplastic right vertebral artery terminates in PICA. Posterior inferior cerebral arteries patent bilaterally. Vertebrobasilar tortuosity. Superior cerebral arteries patent proximally. PCAs supplied via the basilar. Moderate bilateral P2 stenoses, tandem on the left. Distal PCA small vessel atheromatous irregularity. No aneurysm. IMPRESSION: MRI HEAD IMPRESSION: 1. No acute intracranial infarct or other abnormality. 2. Age-related cerebral atrophy with moderate to advanced chronic small vessel ischemic disease. MRA HEAD IMPRESSION: 1. Negative MRA for large vessel occlusion. 2. Extensive intracranial atherosclerosis with moderate bilateral M2 and P2 stenoses. 3. Aplastic right A1. Azygos A2 supplied via the left carotid artery system. Electronically Signed   By: Jeannine Boga M.D.   On: 01/05/2018 20:06   Mr Jodene Nam Head Wo Contrast  Result Date: 01/05/2018 CLINICAL DATA:  Initial evaluation for acute visual changes, word-finding difficulty. EXAM: MRI HEAD WITHOUT CONTRAST MRA HEAD WITHOUT CONTRAST TECHNIQUE: Multiplanar, multiecho pulse sequences of the brain and surrounding structures were obtained without intravenous contrast. Angiographic images of the head were obtained using MRA technique without contrast. COMPARISON:  Prior CT and CTA from earlier the same day. FINDINGS: MRI HEAD FINDINGS Brain: Generalized age-related cerebral atrophy. Patchy and confluent T2/FLAIR hyperintensity within the  periventricular and deep white matter both cerebral hemispheres, most consistent with chronic small vessel ischemic change, moderate to advanced in nature. He scattered superimposed remote lacunar infarcts present within the hemispheric cerebral white matter bilaterally. No abnormal foci of restricted diffusion suggest acute or subacute. Gray-white matter differentiation. No evidence for acute intracranial hemorrhage. Few scattered chronic micro hemorrhages noted within the  cerebellum and deep gray nuclei, like related to chronic microvascular ischemic disease and/or hypertension. No mass lesion, midline shift or mass effect. Ventricular prominence related to global parenchymal volume loss without hydrocephalus. Extra-axial collection. Pituitary gland within normal limits. Vascular: Major intracranial vascular flow voids maintained. Skull and upper cervical spine: Craniocervical junction normal. Upper cervical spine within normal limits. Bone marrow signal intensity normal. Small lipoma noted at the left frontal scalp. Sinuses/Orbits: Globes and orbital soft tissues demonstrate no acute finding. Patient status post lens extraction bilaterally. Small right maxillary sinus retention cyst. No mastoid effusion. Other: None. MRA HEAD FINDINGS ANTERIOR CIRCULATION: Internal carotid arteries patent to the termini without hemodynamically significant stenosis. Aplastic right A1 with widely patent left A1. Azygos A2 without stenosis. Moderate atheromatous irregularity within visualized distal ACA branches. No M1 stenosis or occlusion. Moderate bilateral M2 stenoses with prominent distal atheromatous irregularity throughout the MCA branches. POSTERIOR CIRCULATION: Dominant left vertebral artery widely patent. Hypoplastic right vertebral artery terminates in PICA. Posterior inferior cerebral arteries patent bilaterally. Vertebrobasilar tortuosity. Superior cerebral arteries patent proximally. PCAs supplied via the basilar. Moderate bilateral P2 stenoses, tandem on the left. Distal PCA small vessel atheromatous irregularity. No aneurysm. IMPRESSION: MRI HEAD IMPRESSION: 1. No acute intracranial infarct or other abnormality. 2. Age-related cerebral atrophy with moderate to advanced chronic small vessel ischemic disease. MRA HEAD IMPRESSION: 1. Negative MRA for large vessel occlusion. 2. Extensive intracranial atherosclerosis with moderate bilateral M2 and P2 stenoses. 3. Aplastic right A1. Azygos A2  supplied via the left carotid artery system. Electronically Signed   By: Jeannine Boga M.D.   On: 01/05/2018 20:06    Procedures Procedures (including critical care time)  Medications Ordered in ED Medications  aspirin tablet 325 mg (325 mg Oral Given 01/05/18 2045)  dorzolamide (TRUSOPT) 2 % ophthalmic solution 1 drop (1 drop Both Eyes Given 01/05/18 2048)  loratadine (CLARITIN) tablet 10 mg (has no administration in time range)  atorvastatin (LIPITOR) tablet 40 mg (40 mg Oral Given 01/05/18 2045)  latanoprost (XALATAN) 0.005 % ophthalmic solution 1 drop (1 drop Left Eye Given 01/05/18 2048)  enoxaparin (LOVENOX) injection 40 mg (40 mg Subcutaneous Given 01/05/18 2045)  0.9 %  sodium chloride infusion ( Intravenous New Bag/Given 01/05/18 2047)  acetaminophen (TYLENOL) tablet 650 mg (has no administration in time range)    Or  acetaminophen (TYLENOL) solution 650 mg (has no administration in time range)    Or  acetaminophen (TYLENOL) suppository 650 mg (has no administration in time range)  senna-docusate (Senokot-S) tablet 1 tablet (has no administration in time range)  hydrALAZINE (APRESOLINE) injection 5 mg (has no administration in time range)  timolol (TIMOPTIC) 0.5 % ophthalmic solution 1 drop (1 drop Both Eyes Given 01/05/18 2048)  multivitamin (PROSIGHT) tablet 1 tablet (1 tablet Oral Given 01/05/18 2048)  iopamidol (ISOVUE-370) 76 % injection 50 mL (has no administration in time range)  hydrALAZINE (APRESOLINE) injection 5 mg (5 mg Intravenous Given 01/05/18 1410)  iopamidol (ISOVUE-370) 76 % injection 50 mL (50 mLs Intravenous Contrast Given 01/05/18 1905)  iopamidol (ISOVUE-370) 76 % injection (  Contrast Given 01/05/18 1600)   stroke: mapping  our early stages of recovery book ( Does not apply Given 01/05/18 1800)     Initial Impression / Assessment and Plan / ED Course  I have reviewed the triage vital signs and the nursing notes.  Pertinent labs & imaging results that were  available during my care of the patient were reviewed by me and considered in my medical decision making (see chart for details).     States he has had TIAs in the past and this time he had a neuroma.  States he gets his TIAs when he has a checkerboard pattern on his right eyes associated with word finding difficulties.  He had this earlier today for about 10 to 15 minutes of progressively improved and is asymptomatic at this time.  His work-up here is negative this is not clearly consistent with a neurologic deficit however with word finding difficulties I will discuss with neurology.  Neurology saw the patient recommends TIA work-up.  Will admit to hospitalist.  Final Clinical Impressions(s) / ED Diagnoses   Final diagnoses:  TIA (transient ischemic attack)    ED Discharge Orders    None       Ski Polich, Corene Cornea, MD 01/06/18 [phone]

## 2018-01-06 NOTE — Plan of Care (Addendum)
Per doctor, the patient will get EEG/Echo today and potentially discharge after.  The patient scores 0 on NIH, only needs minimal assistance, but is noncompliant with his bed/chair alarm.  He complains of no pain, dizziness, disorientation or other distress at this time, and is anxious to go back home.  We will plan for potential discharge, reinforce fall prevention and monitor for any changes in neurological status.  Addendum 1809: Pt discharged back to his home via taxi.  Shows no signs or symptoms of distress at this time.

## 2018-01-06 NOTE — Progress Notes (Signed)
NEURO HOSPITALIST PROGRESS NOTE     Subjective: Patient awake, alert, eyes opened in bed in NAD.  Excited that he is not having TIAs. He states that he is ready to go home.  Exam: Vitals:   01/06/18 0339 01/06/18 0813  BP: 134/87 (!) 148/84  Pulse: 63 78  Resp: 20 18  Temp: 98 F (36.7 C) 98.6 F (37 C)  SpO2: 97% 96%    Physical Exam   HEENT-  Normocephalic, had Band-Aid on head from a skin biopsy about 2 weeks ago. without obvious abnormality.  Normal external eye and conjunctiva.  Has hearing loss at baseline and wears hearing aids. Left forehead lipoma.  Cardiovascular- S1-S2 audible, pulses palpable throughout   Lungs-no rhonchi or wheezing noted, no excessive working breathing.  Saturations within normal limits on RA Extremities- Warm, dry and intact Musculoskeletal-no joint tenderness, deformity or swelling Skin-warm and dry, biopsy incision on top of head   Mental Status: Alert, oriented,to person/place/ ti me/ event. Speech fluent without evidence of aphasia.  Able to follow commands without difficulty. Cranial Nerves: II:  Visual fields grossly normal. No extinction to DSS. (wears glasses at baseline). III,IV, VI: ptosis not present, extra-ocular motions intact bilaterally, pupils Not equal, left pupil is 64mm and right pupil about 34mm. Left pupil has irregular shape. Patient states that this is normal since his corneal transplants in 2010 and 2013. Pupils reactive bilaterally.  V,VII: smile symmetric, facial light touch/temperature sensation normal bilaterally VIII: patient wears hearing aids; but can hear without them IX,X: uvula rises symmetrically XI: bilateral shoulder shrug XII: midline tongue extension Motor: Right :  Upper extremity   5/5                          Left:     Upper extremity   5/5             Lower extremity   5/5                                      Lower extremity   5/5 Tone and bulk:normal tone throughout; no atrophy  noted Sensory:  light touch intact throughout, bilaterally Deep Tendon Reflexes: 2+ and symmetric throughout Plantars: Right: downgoing                                Left: downgoing Cerebellar: normal finger-to-nose, normal rapid alternating movements and normal heel-to-shin test bilaterally.  Gait: not tested      Medications:  Scheduled: . aspirin  325 mg Oral QPM  . atorvastatin  40 mg Oral q1800  . dorzolamide  1 drop Both Eyes BID  . enoxaparin (LOVENOX) injection  40 mg Subcutaneous Q24H  . latanoprost  1 drop Left Eye QHS  . loratadine  10 mg Oral Daily  . multivitamin  1 tablet Oral Daily  . timolol  1 drop Both Eyes BID   Continuous: . sodium chloride 50 mL/hr at 01/05/18 2047   KNL:[id number] **OR** acetaminophen (TYLENOL) oral liquid 160 mg/5 mL **OR** acetaminophen, hydrALAZINE, iopamidol, senna-docusate  Pertinent Labs/Diagnostics:   CTA head and neck:  1. No emergent large vessel occlusion. 2. Extensive intracranial atherosclerosis with moderate to advanced narrowing  of bilateral M2 and P2 segments. Multifocal advanced narrowing of medium size vessels in the ACA distribution. 3. Atherosclerosis in the neck without flow limiting stenosis.   MRI brain: 1. No acute intracranial infarct or other abnormality. 2. Age-related cerebral atrophy with moderate to advanced chronic small vessel ischemic disease.  MRA head: 1. Negative MRA for large vessel occlusion. 2. Extensive intracranial atherosclerosis with moderate bilateral M2 and P2 stenoses. 3. Aplastic right A1. Azygos A2 supplied via the left carotid artery system.   Assessment: 84 y.o. male with PMH significant for TIA, HLD, HTN presents to [facility] for transient changes in vision ( he sees a Scientist, product/process development), and transient word finding difficulty not associated with any other symptoms. Episodes last for 10-15 minutes.  1. TIA vs seizure vs migraine accompaniment. The former two diagnoses are felt  to be relatively unlikely. The stereotyped nature of the events with delay between visual aura and dysphasia makes migraine accompaniment (essentially a migraine with aura, absent headache pain) most likely.  2. CTA head/neck shows extensive intracranial atherosclerosis with moderate to advanced narrowing of bilateral M2 and P2 segments. Multifocal advanced narrowing of medium size vessels in the ACA distribution. Although the stenoses could predispose to left occipital lobe hypoperfusion/TIA, the positive symptoms  (moving checkerboard pattern in right hemifield) and "marching phenomenon" of symptoms consistent with spreading depression propagating from left occipital lobe to mid-temporal lobe would be quite atypical for TIA and significantly more likely to occur with a migraine phenomenon.  3. Stroke Risk Factors - hyperlipidemia, hypertension and  past smoking hx    Recommendations:  --Continue aspirin 325 mg  --Continue Lovastatin 20 mg  --HgbA1c, fasting lipid panel --Ibuprofen 600 mg ( at onset of migraine) -- Neurology to sign off at this time. Please call with any further questions or concerns.    Laurey Morale, MSN, NP-C [facility] [phone]  Attending neurologist's note to follow    01/06/2018, 8:45 AM     NEUROHOSPITALIST ADDENDUM Seen and examined the patient today. Likely migraine, however continue ASA and statin for stroke prevention.  Episodes infrequent to warrant trial of Depakote.  F/U with Dr Leonie Man at [facility] associates in 2- 4 weeks.   I have reviewed the contents of history and physical exam as documented by PA/ARNP/Resident and agree with above documentation.  I have discussed and formulated the above plan as documented. Edits to the note have been made as needed.    Karena Addison Tj Kitchings MD [facility] [phone]   If 7pm to 7am, please call on call as listed on AMION.

## 2018-01-06 NOTE — Evaluation (Signed)
Occupational Therapy Evaluation Patient Details Name: Russell [name] MRN: [id number] DOB: 02/03/1927 Today's Date: 01/06/2018    History of Present Illness 84 y.o. male with medical history significant of glaucoma; TIA; syncope;  PVD; HLD; carotid stenosis; macular degeneration; and HTN presenting with possible TIA.  He has been having TIAs for many years and he has never had this sort of evaluation before and so he is frustrated.  "Because I had a TIA, and normally when I have a TIA I wait and it goes away.  But Dr. Bubba Camp wanted me to come in the next time I have a TIA and so I did."  He has a black and right checkerboard in the right lateral part of his vision and word finding difficulty.  He usually exercises 3 times a week and this cognitive impairment is quite different from his baseline.  His symptoms today lasted from about 1030 until about 1200.  Sometimes the symptoms last an hour, sometimes 15 minutes.  The symptoms are consistent with his TIAs.  He has the episodes a couple of times a year.   Clinical Impression   Pt ambulating without use of AD, performed transfers, sit <>stand, and ADL tasks at overall mod I level this session. Pt reports feeling "back to normal". Pt is likely at baseline at this time. No further need for OT intervention at this time. OT to sign off. Thank you for referral.     Follow Up Recommendations  No OT follow up    Equipment Recommendations  None recommended by OT    Recommendations for Other Services Other (comment)(none)     Precautions / Restrictions Precautions Precautions: Fall      Mobility Bed Mobility Overal bed mobility: Modified Independent       General bed mobility comments: increased time  Transfers Overall transfer level: Modified independent Equipment used: None             General transfer comment: mod I overall. He did ambulate while holding onto IV pole as he was getting medication during evaluation    Balance  Overall balance assessment: Modified Independent          ADL either performed or assessed with clinical judgement   ADL Overall ADL's : At baseline         General ADL Comments: Pt reports, " I feel pretty much back to normal". Pt donning socks from EOB,grooming at sink, and transfers within the room at mod I level this session.      Vision Baseline Vision/History: Wears glasses Wears Glasses: At all times Patient Visual Report: No change from baseline;Other (comment)(pt report issues when first coming to hospital but that it has "cleared up")              Pertinent Vitals/Pain Pain Assessment: No/denies pain     Hand Dominance Right   Extremity/Trunk Assessment Upper Extremity Assessment Upper Extremity Assessment: Overall WFL for tasks assessed   Lower Extremity Assessment Lower Extremity Assessment: Overall WFL for tasks assessed       Communication Communication Communication: No difficulties   Cognition Arousal/Alertness: Awake/alert Behavior During Therapy: WFL for tasks assessed/performed Overall Cognitive Status: Within Functional Limits for tasks assessed                    Home Living Family/patient expects to be discharged to:: Assisted living      Additional Comments: Pt reports he manages his medications and goes to dining room for 1 meal each  day. He has an apartment at facility.       Prior Functioning/Environment Level of Independence: Independent                          OT Goals(Current goals can be found in the care plan section) Acute Rehab OT Goals Patient Stated Goal: to return home OT Goal Formulation: With patient Time For Goal Achievement: 01/20/18 Potential to Achieve Goals: Good  OT Frequency:                AM-PAC PT "6 Clicks" Daily Activity     Outcome Measure Help from another person eating meals?: None Help from another person taking care of personal grooming?: None Help from another person  toileting, which includes using toliet, bedpan, or urinal?: None Help from another person bathing (including washing, rinsing, drying)?: None Help from another person to put on and taking off regular upper body clothing?: None Help from another person to put on and taking off regular lower body clothing?: None 6 Click Score: 24   End of Session Nurse Communication: Mobility status  Activity Tolerance: Patient tolerated treatment well Patient left: in chair;with chair alarm set                   Time: [phone] OT Time Calculation (min): 26 min Charges:  OT General Charges $OT Visit: 1 Visit OT Evaluation $OT Eval Low Complexity: 1 Low OT Treatments $Therapeutic Activity: 8-22 mins   Rayhana Slider P, MS, OTR/L 01/06/2018, 11:22 AM

## 2018-01-06 NOTE — Progress Notes (Signed)
CSW acknowledging consult for patient being from [facility]. Patient lives in [place] at [facility], no CSW assistance needed for patient to return to his apartment.   Please consult should the patient have placement needs. CSW signing off.  Laveda Abbe, [place] Clinical Social Worker [phone]

## 2018-01-06 NOTE — Progress Notes (Signed)
Nutrition Brief Note   Patient with PMH TIA, PVD, HLD, HTN, presents with a TIA. He states he has a history of recurrent TIAs but normally waits and they go away.   Wt Readings from Last 15 Encounters:  01/05/18 155 lb (70.3 kg)  12/07/17 152 lb 9.6 oz (69.2 kg)  11/01/17 156 lb 3.2 oz (70.9 kg)  08/20/17 153 lb (69.4 kg)  07/05/17 148 lb 12.8 oz (67.5 kg)  06/25/17 148 lb (67.1 kg)  05/04/17 148 lb (67.1 kg)  05/03/17 148 lb 9.6 oz (67.4 kg)  04/05/17 149 lb 12.8 oz (67.9 kg)  01/25/17 149 lb (67.6 kg)  12/25/16 151 lb (68.5 kg)  09/29/16 154 lb (69.9 kg)  05/19/16 156 lb (70.8 kg)  10/29/15 158 lb (71.7 kg)  04/30/15 152 lb (68.9 kg)    Body mass index is 23.57 kg/m. Patient meets criteria for normal weight based on current BMI.   Current diet order is heart healthy, patient is consuming approximately 100% of meals at this time. Labs and medications reviewed.   No nutrition interventions warranted at this time. If nutrition issues arise, please consult RD.   Satira Anis. Micheil Klaus, MS, RD LDN Inpatient Clinical Dietitian Pager [phone]

## 2018-01-06 NOTE — Progress Notes (Signed)
EEG Complete. Results pending. 

## 2018-01-06 NOTE — Care Management Note (Signed)
Case Management Note  Patient Details  Name: Deonte Otting MRN: [id number] Date of Birth: 1926/10/05  Subjective/Objective:      Pt admitted with TIA. He is from [place].               Action/Plan: Pt to d/c back to ILF at [facility]. CM called [facility] about transportation. Pt can take a cab to [facility] and they will provide the cab company with a voucher to cover the cost. Bedside RN provided the information and he will call and set up the cab.  Pt to go to the Healthcare part of [facility] to be assessed prior to returning to his apartment. Patient is aware.    Expected Discharge Date:                  Expected Discharge Plan:  Home/Self Care  In-House Referral:     Discharge planning Services  CM Consult  Post Acute Care Choice:    Choice offered to:     DME Arranged:    DME Agency:     HH Arranged:    [place] Agency:     Status of Service:  Completed, signed off  If discussed at [employer] of Stay Meetings, dates discussed:    Additional Comments:  Pollie Friar, RN 01/06/2018, 5:01 PM

## 2018-01-07 ENCOUNTER — Telehealth: Payer: Self-pay

## 2018-01-07 NOTE — Telephone Encounter (Signed)
Transition Care Management Follow-up Telephone Call  How have you been since you were released from the hospital? Per patient he has been feeling well. No weakness.   Do you understand why you were in the hospital? yes  Do you have a copy of your discharge instructions Yes Do you understand the discharge instrcutions? yes  Where were you discharged to? Independent Living  Do you have support at home? No    Items Reviewed:  Medications obtained Yes. Picked up lipitor from pharmacy today  Medications reviewed: Yes, discontinued Lovastatin  Dietary changes reviewed: yes  Home Health? No  DME ordered at discharge obtained? NA,   Medical supplies: NA,    Functional Questionnaire:   Activities of Daily Living (ADLs):   He states they are independent in the following: ambulation, bathing and hygiene, feeding, continence, grooming, toileting, dressing and medication management States they require assistance with the following: None  Any transportation issues/concerns?: no  Any patient concerns? no  Confirmed importance and date/time of follow-up visits scheduled with PCP: yes, Dr. Bubba Camp 01/11/2018 @ 11am  Confirm appointment scheduled with specialist? NA  Confirmed with patient if condition begins to worsen call PCP or If it's emergency go to the ER.

## 2018-01-07 NOTE — Telephone Encounter (Signed)
I have made the 1st attempt to contact the patient or family member in charge, in order to follow up from recently being discharged from the hospital. I left a message on voicemail but I will make another attempt at a different time.  

## 2018-01-10 ENCOUNTER — Non-Acute Institutional Stay: Payer: Medicare Other | Admitting: Internal Medicine

## 2018-01-10 ENCOUNTER — Encounter: Payer: Self-pay | Admitting: Internal Medicine

## 2018-01-10 VITALS — BP 138/66 | HR 62 | Temp 97.6°F | Resp 18 | Ht 70.0 in | Wt 157.4 lb

## 2018-01-10 DIAGNOSIS — I10 Essential (primary) hypertension: Secondary | ICD-10-CM

## 2018-01-10 DIAGNOSIS — E785 Hyperlipidemia, unspecified: Secondary | ICD-10-CM | POA: Diagnosis not present

## 2018-01-10 DIAGNOSIS — Z8673 Personal history of transient ischemic attack (TIA), and cerebral infarction without residual deficits: Secondary | ICD-10-CM

## 2018-01-10 DIAGNOSIS — G43009 Migraine without aura, not intractable, without status migrainosus: Secondary | ICD-10-CM | POA: Diagnosis not present

## 2018-01-10 NOTE — Patient Instructions (Signed)
  Your appointment with neurology service has been set for 03/14/18 at 9 am.

## 2018-01-10 NOTE — Progress Notes (Signed)
Provider:  Blanchie Serve MD  Location:  [place] of Service:  Clinic (12)  PCP: Blanchie Serve, MD Patient Care Team: Blanchie Serve, MD as PCP - General (Internal Medicine) [place], [facility] Crista Luria, MD as Consulting Physician (Dermatology) Marilynne Halsted, MD as Referring Physician (Ophthalmology) Edilia Bo, Tracie Harrier, MD as Referring Physician (Ophthalmology)  Extended Emergency Contact Information Primary Emergency Contact: Osvaldo Human of [place] Phone: [phone] Relation: Son  Code Status: DNR  Goals of Care: Advanced Directive information Advanced Directives 01/10/2018  Does Patient Have a Medical Advance Directive? Yes  Type of Paramedic of [place];Living will;Out of facility DNR (pink MOST or yellow form)  Does patient want to make changes to medical advance directive? No - Patient declined  Copy of [place] in Chart? Yes  Would patient like information on creating a medical advance directive? -  Pre-existing out of facility DNR order (yellow form or pink MOST form) Pink MOST form placed in chart (order not valid for inpatient use);Yellow form placed in chart (order not valid for inpatient use)      Chief Complaint  Patient presents with  . Hospitalization Follow-up    Patient stated that he feels fine since being home from the hospital.   . Medication Refill    No refills needed at this time    HPI: Patient is a 84 y.o. male seen today for hospital follow up visit. He was in the hospital from 01/05/18-01/06/18 with concern for possible TIA. He was sitting in front of his computer when he had checkerboard like appearance in front of right eye and also had word finding difficulty. He then dialled 911 and was taken to ED for further workup. He was seen by neurology team and admitted for stroke workup. MRI/MRA showed no acute findings of infarct or bleed but showed  extensive intracranial atherosclerosis. Echocardiogram and CTA head and neck showed no critical findings. Neurology service felt these symptoms were unlikely to be from TIA/ CVA and given similar symptom in past, thoughts are for it to be from migraine. EEG was normal. His statin was changed to atorvastatin from lovastatin given his extensive intracranial atherosclerosis. He has medical history of hypertension, TIA and hyperlipidemia. He is seen in the clinic. He denies any recurrence of symptom.   Past Medical History:  Diagnosis Date  . Actinic keratosis 10/18/2012  . 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   . Atrophy, Fuchs' 12/10/2011  . 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 Cervicalgia 04/25/2013  . 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 Cyclitis, chronic 11/18/2012  . Deafness 10/18/2012  . Endothelial corneal dystrophy   . Essential hypertension 10/18/2012  . Hammer toe 04/24/2014  . Hyperlipidemia 10/18/2012  . Intermediate stage nonexudative age-related macular degeneration of both eyes 04/16/2016  . Nocturia 05/19/2016  . Occlusion and stenosis of carotid artery with cerebral infarction   . Other specified cardiac dysrhythmias(427.89)   . Pain in joint, pelvic region and thigh   . Paresthesia 04/30/2015   Toes of both feet   . Peripheral vascular disease, unspecified ([place])   . Primary open angle glaucoma 09/25/2011  . 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  . Rosacea   . Seborrheic keratosis 04/24/2014  . Syncope 06/27/2013  . TIA (transient ischemic attack) 10/18/2012   States he has had about 5  times  . Urine frequency    Past Surgical History:  Procedure Laterality Date  . CATARACT EXTRACTION  2005   bilateral  . CORNEAL TRANSPLANT  2010 left; 2013 right eye  . HERNIA REPAIR  1985   Left  . HERNIA REPAIR  03/2004   Right  . SHOULDER DEBRIDEMENT Left 2005  . TONSILLECTOMY Bilateral childhood  . TRANSURETHRAL RESECTION OF PROSTATE N/A  10/2003  . VASECTOMY      reports that he quit smoking about 53 years ago. He has a 25.00 pack-year smoking history. He has never used smokeless tobacco. He reports that he does not drink alcohol or use drugs. Social History   Socioeconomic History  . Marital status: Widowed    Spouse name: Not on file  . Number of children: Not on file  . Years of education: Not on file  . Highest education level: Not on file  Occupational History  . Occupation: retired Insurance claims handler  Social Needs  . Financial resource strain: Not hard at all  . Food insecurity:    Worry: Never true    Inability: Never true  . Transportation needs:    Medical: No    Non-medical: No  Tobacco Use  . Smoking status: Former Smoker    Packs/day: 1.00    Years: 25.00    Pack years: 25.00    Last attempt to quit: 10/18/1964    Years since quitting: 53.2  . Smokeless tobacco: Never Used  . Tobacco comment: quit 1966  Substance and Sexual Activity  . Alcohol use: No    Alcohol/week: 0.6 oz    Types: 1 Glasses of wine per week    Frequency: Never  . Drug use: No  . Sexual activity: Not on file  Lifestyle  . Physical activity:    Days per week: 5 days    Minutes per session: 10 min  . Stress: Not at all  Relationships  . Social connections:    Talks on phone: More than three times a week    Gets together: More than three times a week    Attends religious service: 1 to 4 times per year    Active member of club or organization: Yes    Attends meetings of clubs or organizations: 1 to 4 times per year    Relationship status: Widowed  . Intimate partner violence:    Fear of current or ex partner: No    Emotionally abused: No    Physically abused: No    Forced sexual activity: No  Other Topics Concern  . Not on file  Social History Narrative   [facility] since 08/21/2011   Married to Nance Will   Exercise: 3 x week bicycle   Previously smoked stopped 1966   Does not drink  cafferinated beverage   Drinks one small glass of wine few nights a week     Functional Status Survey:    Family History  Problem Relation Age of Onset  . Cancer Mother   . Heart disease Father     Health Maintenance  Topic Date Due  . INFLUENZA VACCINE  02/17/2018  . TETANUS/TDAP  06/26/2021  . PNA vac Low Risk Adult  Completed    No Known Allergies  Outpatient Encounter Medications as of 01/10/2018  Medication Sig  . aspirin 325 MG tablet Take 325 mg by mouth every evening.  Marland Kitchen atorvastatin (LIPITOR) 40 MG tablet Take 1 tablet (40 mg total) by mouth daily at 6 PM.  . dorzolamide (TRUSOPT)  2 % ophthalmic solution Place 1 drop into both eyes 2 (two) times daily.  . enalapril (VASOTEC) 20 MG tablet TAKE 1 TABLET TWICE A DAY TO CONTROL BLOOD PRESSURE  . loratadine (CLARITIN) 10 MG tablet Take 10 mg by mouth daily.  . Multiple Vitamins-Minerals (PRESERVISION AREDS 2) CAPS Take 1 capsule by mouth 2 (two) times daily.  . Omega-3 Fatty Acids (FISH OIL) 1000 MG CAPS Take 1,000 mg by mouth 2 (two) times daily.   . timolol (BETIMOL) 0.5 % ophthalmic solution Place 1 drop into both eyes 2 (two) times daily.  . Travoprost, BAK Free, (TRAVATAN Z) 0.004 % SOLN ophthalmic solution Place 1 drop into the left eye at bedtime. One drop left eye once a day for glaucoma    No facility-administered encounter medications on file as of 01/10/2018.     Review of Systems  Constitutional: Negative for appetite change, chills and fever.  HENT: Negative for mouth sores and trouble swallowing.   Eyes: Negative for pain, itching and visual disturbance.  Respiratory: Negative for cough and shortness of breath.   Cardiovascular: Negative for chest pain and palpitations.  Gastrointestinal: Negative for abdominal pain, blood in stool, nausea and vomiting.  Genitourinary: Negative for dysuria.  Musculoskeletal: Negative for back pain and gait problem.       No fall reported  Neurological: Negative for  speech difficulty and headaches.  Psychiatric/Behavioral: Negative for confusion. The patient is not nervous/anxious.     Vitals:   01/10/18 1401  BP: 138/66  Pulse: 62  Resp: 18  Temp: 97.6 F (36.4 C)  TempSrc: Oral  SpO2: 98%  Weight: 157 lb 6.4 oz (71.4 kg)  Height: 5\' 10"  (1.778 m)   Body mass index is 22.58 kg/m. Physical Exam  Constitutional: He is oriented to person, place, and time. He appears well-developed and well-nourished. No distress.  HENT:  Head: Normocephalic and atraumatic.  Nose: Nose normal.  Mouth/Throat: Oropharynx is clear and moist. No oropharyngeal exudate.  Has hearing aid. Left forehead lipoma  Eyes: Pupils are equal, round, and reactive to light. Conjunctivae and EOM are normal. Right eye exhibits no discharge. Left eye exhibits no discharge.  Neck: Normal range of motion. Neck supple.  Cardiovascular: Normal rate, regular rhythm and intact distal pulses.  Pulmonary/Chest: Effort normal and breath sounds normal. No respiratory distress. He has no wheezes. He has no rales.  Abdominal: Soft. Bowel sounds are normal. There is no tenderness.  Musculoskeletal: Normal range of motion. He exhibits no edema or tenderness.  Able to move all 4 extremities, good strength  Lymphadenopathy:    He has no cervical adenopathy.  Neurological: He is alert and oriented to person, place, and time. He exhibits normal muscle tone.  Skin: Skin is warm and dry. He is not diaphoretic.  Psychiatric: He has a normal mood and affect. His behavior is normal.    Labs reviewed: Basic Metabolic Panel: Recent Labs    08/23/17 0000 10/25/17 0755 01/05/18 1201 01/05/18 1234  NA 139 142 136 139  K 4.0 4.0 3.7 3.9  CL 104 106 104 101  CO2 29 31 25   --   GLUCOSE 74 89 89 83  BUN 18 19 14 15   CREATININE 0.89 0.90 0.79 0.80  CALCIUM 9.1 8.7 8.7*  --    Liver Function Tests: Recent Labs    04/26/17 0735 08/23/17 0000 01/05/18 1201  AST 21 22 28   ALT 17 15 24     ALKPHOS  --   --  48  BILITOT 0.9 0.7 1.1  PROT 5.8* 5.9* 5.9*  ALBUMIN  --   --  3.7   No results for input(s): LIPASE, AMYLASE in the last 8760 hours. No results for input(s): AMMONIA in the last 8760 hours. CBC: Recent Labs    04/26/17 0735 08/23/17 0000 01/05/18 1201 01/05/18 1234  WBC 6.8 7.0 7.6  --   NEUTROABS  --  4,424 4.5  --   HGB 15.2 15.2 15.1 13.9  HCT 44.3 43.2 44.8 41.0  MCV 87.4 86.4 88.7  --   PLT 162 176 152  --    Cardiac Enzymes: No results for input(s): CKTOTAL, CKMB, CKMBINDEX, TROPONINI in the last 8760 hours. BNP: Invalid input(s): POCBNP No results found for: HGBA1C Lab Results  Component Value Date   TSH 2.52 04/26/2017   No results found for: VITAMINB12 No results found for: FOLATE No results found for: IRON, TIBC, FERRITIN  Imaging and Procedures obtained prior to SNF admission: Ct Angio Head W Or Wo Contrast  Result Date: 01/05/2018 CLINICAL DATA:  Abnormal vision and word-finding difficulty.  TIA. EXAM: CT ANGIOGRAPHY HEAD AND NECK TECHNIQUE: Multidetector CT imaging of the head and neck was performed using the standard protocol during bolus administration of intravenous contrast. Multiplanar CT image reconstructions and MIPs were obtained to evaluate the vascular anatomy. Carotid stenosis measurements (when applicable) are obtained utilizing NASCET criteria, using the distal internal carotid diameter as the denominator. CONTRAST:  67mL ISOVUE-370 IOPAMIDOL (ISOVUE-370) INJECTION 76% COMPARISON:  Head CT from earlier today FINDINGS: CTA NECK FINDINGS Aortic arch: Atherosclerotic plaque. Brachiocephalic and right common carotid origins are incompletely covered. Right carotid system: Calcified plaque at the common carotid bifurcation primarily narrowing the ECA origin. No ICA flow limiting stenosis or ulceration. The right ICA is small compared to the left due to variant circle-of-Willis. Left carotid system: Mild calcified plaque at the common  carotid bifurcation and proximal ICA. No flow limiting stenosis or ulceration. Vertebral arteries: No proximal subclavian flow limiting stenosis. Strong left vertebral artery dominance. Both vertebral arteries are widely patent to the dura. Skeleton: Diffuse degenerative change without acute finding. Other neck: No acute finding Upper chest: Mild centrilobular emphysema. Review of the MIP images confirms the above findings CTA HEAD FINDINGS Anterior circulation: Azygos A2 with aplastic right A1 segment. The left ICA is larger than the right. Calcified plaque on the carotid siphons with mild to moderate narrowing at the right cavernous anterior genu. Bilateral M2 segment atheromatous narrowings that are moderate to advanced. Multifocal medium-sized ACA branch high-grade narrowings best seen on sagittal thick MIPS. Posterior circulation: The right vertebral artery ends in PICA. A left PICA is also patent. Vertebral and basilar tortuosity. Atheromatous irregularity of bilateral posterior cerebral arteries with moderate to advanced bilateral P2 segment narrowings. Negative for aneurysm. Venous sinuses: Patent on the delayed phase Anatomic variants: As above Delayed phase: No abnormal parenchymal enhancement. Chronic small vessel ischemia in the cerebral white matter and deep gray nuclei. Review of the MIP images confirms the above findings IMPRESSION: 1. No emergent large vessel occlusion. 2. Extensive intracranial atherosclerosis with moderate to advanced narrowing of bilateral M2 and P2 segments. Multifocal advanced narrowing of medium size vessels in the ACA distribution. 3. Atherosclerosis in the neck without flow limiting stenosis. 4.  Aortic Atherosclerosis (ICD10-I70.0). Electronically Signed   By: Monte Fantasia M.D.   On: 01/05/2018 19:33   Dg Chest 2 View  Result Date: 01/05/2018 CLINICAL DATA:  Visual alterations.  Hypertension. EXAM: CHEST -  2 VIEW COMPARISON:  Chest CT December 30, 2016 FINDINGS: No edema  or consolidation. Heart is upper normal in size with pulmonary vascularity normal. No adenopathy. No bone lesions. IMPRESSION: No edema or consolidation. Electronically Signed   By: Lowella Grip III M.D.   On: 01/05/2018 13:23   Ct Head Wo Contrast  Result Date: 01/05/2018 CLINICAL DATA:  Aphasia and altered vision EXAM: CT HEAD WITHOUT CONTRAST TECHNIQUE: Contiguous axial images were obtained from the base of the skull through the vertex without intravenous contrast. COMPARISON:  Dec 17, 2017 FINDINGS: Brain: Moderate diffuse atrophy is stable. Prominence of the cisterna magna is an anatomic variant. There is no intracranial mass, hemorrhage, extra-axial fluid collection, or midline shift. There is extensive small vessel disease throughout the centra semiovale bilaterally. Small vessel disease is noted in the internal and external capsule regions bilaterally. There is evidence of a prior small infarct in the left thalamus. There is a small lacunar infarct in the inferior right centrum semiovale. There is small vessel disease in the basilar perforator distribution of the mid pons. No acute infarct is demonstrated on this study. Vascular: There is no appreciable hyperdense vessel. There is calcification in each carotid siphon region as well as in the left distal vertebral artery. Skull: Bony calvarium appears intact. There is a left frontal scalp lipoma measuring 3.1 x 0.8 cm. Sinuses/Orbits: There is mucosal thickening in several ethmoid air cells. There are retention cysts in each maxillary antrum. Other paranasal sinuses are clear. Orbits appear symmetric bilaterally. Patient has had cataract extractions bilaterally. Other: Mastoid air cells are clear. IMPRESSION: 1. Atrophy with extensive supratentorial small vessel disease. Occasional prior lacunar infarcts. There is also basilar pontine distribution small vessel disease. No acute infarct evident. No mass or hemorrhage. 2.  Foci of arterial vascular  calcification noted. 3.  Areas of paranasal sinus disease. 4.  Left frontal scalp lipoma. Electronically Signed   By: Lowella Grip III M.D.   On: 01/05/2018 13:19   Ct Angio Neck W Or Wo Contrast  Result Date: 01/05/2018 CLINICAL DATA:  Abnormal vision and word-finding difficulty.  TIA. EXAM: CT ANGIOGRAPHY HEAD AND NECK TECHNIQUE: Multidetector CT imaging of the head and neck was performed using the standard protocol during bolus administration of intravenous contrast. Multiplanar CT image reconstructions and MIPs were obtained to evaluate the vascular anatomy. Carotid stenosis measurements (when applicable) are obtained utilizing NASCET criteria, using the distal internal carotid diameter as the denominator. CONTRAST:  80mL ISOVUE-370 IOPAMIDOL (ISOVUE-370) INJECTION 76% COMPARISON:  Head CT from earlier today FINDINGS: CTA NECK FINDINGS Aortic arch: Atherosclerotic plaque. Brachiocephalic and right common carotid origins are incompletely covered. Right carotid system: Calcified plaque at the common carotid bifurcation primarily narrowing the ECA origin. No ICA flow limiting stenosis or ulceration. The right ICA is small compared to the left due to variant circle-of-Willis. Left carotid system: Mild calcified plaque at the common carotid bifurcation and proximal ICA. No flow limiting stenosis or ulceration. Vertebral arteries: No proximal subclavian flow limiting stenosis. Strong left vertebral artery dominance. Both vertebral arteries are widely patent to the dura. Skeleton: Diffuse degenerative change without acute finding. Other neck: No acute finding Upper chest: Mild centrilobular emphysema. Review of the MIP images confirms the above findings CTA HEAD FINDINGS Anterior circulation: Azygos A2 with aplastic right A1 segment. The left ICA is larger than the right. Calcified plaque on the carotid siphons with mild to moderate narrowing at the right cavernous anterior genu. Bilateral M2 segment  atheromatous narrowings  that are moderate to advanced. Multifocal medium-sized ACA branch high-grade narrowings best seen on sagittal thick MIPS. Posterior circulation: The right vertebral artery ends in PICA. A left PICA is also patent. Vertebral and basilar tortuosity. Atheromatous irregularity of bilateral posterior cerebral arteries with moderate to advanced bilateral P2 segment narrowings. Negative for aneurysm. Venous sinuses: Patent on the delayed phase Anatomic variants: As above Delayed phase: No abnormal parenchymal enhancement. Chronic small vessel ischemia in the cerebral white matter and deep gray nuclei. Review of the MIP images confirms the above findings IMPRESSION: 1. No emergent large vessel occlusion. 2. Extensive intracranial atherosclerosis with moderate to advanced narrowing of bilateral M2 and P2 segments. Multifocal advanced narrowing of medium size vessels in the ACA distribution. 3. Atherosclerosis in the neck without flow limiting stenosis. 4.  Aortic Atherosclerosis (ICD10-I70.0). Electronically Signed   By: Monte Fantasia M.D.   On: 01/05/2018 19:33   Mr Brain Wo Contrast  Result Date: 01/05/2018 CLINICAL DATA:  Initial evaluation for acute visual changes, word-finding difficulty. EXAM: MRI HEAD WITHOUT CONTRAST MRA HEAD WITHOUT CONTRAST TECHNIQUE: Multiplanar, multiecho pulse sequences of the brain and surrounding structures were obtained without intravenous contrast. Angiographic images of the head were obtained using MRA technique without contrast. COMPARISON:  Prior CT and CTA from earlier the same day. FINDINGS: MRI HEAD FINDINGS Brain: Generalized age-related cerebral atrophy. Patchy and confluent T2/FLAIR hyperintensity within the periventricular and deep white matter both cerebral hemispheres, most consistent with chronic small vessel ischemic change, moderate to advanced in nature. He scattered superimposed remote lacunar infarcts present within the hemispheric cerebral  white matter bilaterally. No abnormal foci of restricted diffusion suggest acute or subacute. Gray-white matter differentiation. No evidence for acute intracranial hemorrhage. Few scattered chronic micro hemorrhages noted within the cerebellum and deep gray nuclei, like related to chronic microvascular ischemic disease and/or hypertension. No mass lesion, midline shift or mass effect. Ventricular prominence related to global parenchymal volume loss without hydrocephalus. Extra-axial collection. Pituitary gland within normal limits. Vascular: Major intracranial vascular flow voids maintained. Skull and upper cervical spine: Craniocervical junction normal. Upper cervical spine within normal limits. Bone marrow signal intensity normal. Small lipoma noted at the left frontal scalp. Sinuses/Orbits: Globes and orbital soft tissues demonstrate no acute finding. Patient status post lens extraction bilaterally. Small right maxillary sinus retention cyst. No mastoid effusion. Other: None. MRA HEAD FINDINGS ANTERIOR CIRCULATION: Internal carotid arteries patent to the termini without hemodynamically significant stenosis. Aplastic right A1 with widely patent left A1. Azygos A2 without stenosis. Moderate atheromatous irregularity within visualized distal ACA branches. No M1 stenosis or occlusion. Moderate bilateral M2 stenoses with prominent distal atheromatous irregularity throughout the MCA branches. POSTERIOR CIRCULATION: Dominant left vertebral artery widely patent. Hypoplastic right vertebral artery terminates in PICA. Posterior inferior cerebral arteries patent bilaterally. Vertebrobasilar tortuosity. Superior cerebral arteries patent proximally. PCAs supplied via the basilar. Moderate bilateral P2 stenoses, tandem on the left. Distal PCA small vessel atheromatous irregularity. No aneurysm. IMPRESSION: MRI HEAD IMPRESSION: 1. No acute intracranial infarct or other abnormality. 2. Age-related cerebral atrophy with moderate  to advanced chronic small vessel ischemic disease. MRA HEAD IMPRESSION: 1. Negative MRA for large vessel occlusion. 2. Extensive intracranial atherosclerosis with moderate bilateral M2 and P2 stenoses. 3. Aplastic right A1. Azygos A2 supplied via the left carotid artery system. Electronically Signed   By: Jeannine Boga M.D.   On: 01/05/2018 20:06   Mr [name] Head Wo Contrast  Result Date: 01/05/2018 CLINICAL DATA:  Initial evaluation for acute visual changes, word-finding  difficulty. EXAM: MRI HEAD WITHOUT CONTRAST MRA HEAD WITHOUT CONTRAST TECHNIQUE: Multiplanar, multiecho pulse sequences of the brain and surrounding structures were obtained without intravenous contrast. Angiographic images of the head were obtained using MRA technique without contrast. COMPARISON:  Prior CT and CTA from earlier the same day. FINDINGS: MRI HEAD FINDINGS Brain: Generalized age-related cerebral atrophy. Patchy and confluent T2/FLAIR hyperintensity within the periventricular and deep white matter both cerebral hemispheres, most consistent with chronic small vessel ischemic change, moderate to advanced in nature. He scattered superimposed remote lacunar infarcts present within the hemispheric cerebral white matter bilaterally. No abnormal foci of restricted diffusion suggest acute or subacute. Gray-white matter differentiation. No evidence for acute intracranial hemorrhage. Few scattered chronic micro hemorrhages noted within the cerebellum and deep gray nuclei, like related to chronic microvascular ischemic disease and/or hypertension. No mass lesion, midline shift or mass effect. Ventricular prominence related to global parenchymal volume loss without hydrocephalus. Extra-axial collection. Pituitary gland within normal limits. Vascular: Major intracranial vascular flow voids maintained. Skull and upper cervical spine: Craniocervical junction normal. Upper cervical spine within normal limits. Bone marrow signal intensity  normal. Small lipoma noted at the left frontal scalp. Sinuses/Orbits: Globes and orbital soft tissues demonstrate no acute finding. Patient status post lens extraction bilaterally. Small right maxillary sinus retention cyst. No mastoid effusion. Other: None. MRA HEAD FINDINGS ANTERIOR CIRCULATION: Internal carotid arteries patent to the termini without hemodynamically significant stenosis. Aplastic right A1 with widely patent left A1. Azygos A2 without stenosis. Moderate atheromatous irregularity within visualized distal ACA branches. No M1 stenosis or occlusion. Moderate bilateral M2 stenoses with prominent distal atheromatous irregularity throughout the MCA branches. POSTERIOR CIRCULATION: Dominant left vertebral artery widely patent. Hypoplastic right vertebral artery terminates in PICA. Posterior inferior cerebral arteries patent bilaterally. Vertebrobasilar tortuosity. Superior cerebral arteries patent proximally. PCAs supplied via the basilar. Moderate bilateral P2 stenoses, tandem on the left. Distal PCA small vessel atheromatous irregularity. No aneurysm. IMPRESSION: MRI HEAD IMPRESSION: 1. No acute intracranial infarct or other abnormality. 2. Age-related cerebral atrophy with moderate to advanced chronic small vessel ischemic disease. MRA HEAD IMPRESSION: 1. Negative MRA for large vessel occlusion. 2. Extensive intracranial atherosclerosis with moderate bilateral M2 and P2 stenoses. 3. Aplastic right A1. Azygos A2 supplied via the left carotid artery system. Electronically Signed   By: Jeannine Boga M.D.   On: 01/05/2018 20:06    Assessment/Plan  1. Essential hypertension Controlled BP. Continue enalapril 20 mg bid and monitor. Stable BMP on chart review.   2. Hyperlipidemia, unspecified hyperlipidemia type Tolerating atorvastatin well. Continue this and check lipid panel prior to next visit in October.   3. History of TIA (transient ischemic attack) Old lacunar infarct noted in brain  imaging. Continue aspirin and statin with his extensive atherosclerotic changes noted on imaging.   4. Atypical migraine Advise from neurology is to take ibuprofen at onset of headache. Pt understands this. Neurology appointment made this visit on 03/14/18 at 9 am at [facility].     Family/ staff Communication: reviewed medical records from ED, neurology and hospitalist service. Reviewed imaging results, echocardiogram and lab results. Reviewed care plan with patient. New medication changes have been made. Neurology appointment made this visit.  Labs/tests ordered: routine labs during follow up in October.   Blanchie Serve, MD Internal Medicine [facility] [address] [place], [place] [address] Cell Phone (Monday-Friday 8 am - 5 pm): [phone] On Call: [phone] and follow prompts after 5 pm and on weekends Office  Phone: [phone] Office Fax: [phone]

## 2018-01-11 ENCOUNTER — Encounter: Payer: Self-pay | Admitting: Internal Medicine

## 2018-01-12 ENCOUNTER — Encounter: Payer: Self-pay | Admitting: Internal Medicine

## 2018-01-17 ENCOUNTER — Other Ambulatory Visit: Payer: Self-pay

## 2018-01-17 MED ORDER — ATORVASTATIN CALCIUM 40 MG PO TABS: 40.0000 mg | ORAL_TABLET | Freq: Every day | ORAL | 3 refills | Status: DC

## 2018-02-14 ENCOUNTER — Telehealth: Payer: Self-pay | Admitting: *Deleted

## 2018-02-14 NOTE — Telephone Encounter (Signed)
[place], [place] coordinator approached me and said that patient cut himself on a nail on 02/11/18 causing a skin tear. He was asking her if he should get a Td vax due to the injury and it being >5 years since his last one. (Last received Tdap in 2012) I advised that he should go ahead and get one done at the pharmacy. She said she will inform him and let us know if there are any problems.

## 2018-02-17 ENCOUNTER — Other Ambulatory Visit: Payer: Self-pay | Admitting: Internal Medicine

## 2018-02-21 ENCOUNTER — Ambulatory Visit (INDEPENDENT_AMBULATORY_CARE_PROVIDER_SITE_OTHER): Payer: Medicare Other | Admitting: Orthopedic Surgery

## 2018-02-21 ENCOUNTER — Ambulatory Visit (INDEPENDENT_AMBULATORY_CARE_PROVIDER_SITE_OTHER): Payer: Medicare Other

## 2018-02-21 ENCOUNTER — Encounter (INDEPENDENT_AMBULATORY_CARE_PROVIDER_SITE_OTHER): Payer: Self-pay | Admitting: Orthopedic Surgery

## 2018-02-21 VITALS — Ht 70.0 in | Wt 157.0 lb

## 2018-02-21 DIAGNOSIS — M79671 Pain in right foot: Secondary | ICD-10-CM

## 2018-02-21 DIAGNOSIS — M2041 Other hammer toe(s) (acquired), right foot: Secondary | ICD-10-CM | POA: Diagnosis not present

## 2018-02-21 DIAGNOSIS — M205X1 Other deformities of toe(s) (acquired), right foot: Secondary | ICD-10-CM | POA: Diagnosis not present

## 2018-02-21 NOTE — Progress Notes (Signed)
Office Visit Note   Patient: Russell [name]           Date of Birth: 10-13-1926           MRN: [id number] Visit Date: 02/21/2018              Requested by: Blanchie Serve, MD [address] [place], [place] [address] PCP: Blanchie Serve, MD  Chief Complaint  Patient presents with  . Right Foot - Pain    Hammertoe, right 2nd toe      HPI: Patient is an 84 year old gentleman who presents with painful clawing right foot second toe.  Assessment & Plan: Visit Diagnoses:  1. Pain in right foot   2. Claw toe, acquired, right     Plan: Discussed with the patient we should optimize conservative therapy including extra-depth shoes sole orthotics to provide better arch support follow-up as needed.  Follow-Up Instructions: Return if symptoms worsen or fail to improve.   Ortho Exam  Patient is alert, oriented, no adenopathy, well-dressed, normal affect, normal respiratory effort. Examination patient has a good dorsalis pedis pulse he has good ankle good subtalar motion.  He has fixed clawing of the lesser toes worse on the second toe than the other toes.  He has a long second and third metatarsal.  There is no callus ulcer or skin breakdown dorsally over the PIP joint of the second toe.  He does have some callus over the tip of the toe but no swelling no signs of infection.  Imaging: No results found. No images are attached to the encounter.  Labs: Lab Results  Component Value Date   ESRSEDRATE 0 01/05/2018   CRP <0.8 01/05/2018     Lab Results  Component Value Date   ALBUMIN 3.7 01/05/2018   ALBUMIN 3.9 06/22/2013   ALBUMIN 3.9 06/22/2013    Body mass index is 22.53 kg/m.  Orders:  Orders Placed This Encounter  Procedures  . XR Foot Complete Right   No orders of the defined types were placed in this encounter.    Procedures: No procedures performed  Clinical Data: No additional findings.  ROS:  All other systems negative, except as noted in the  HPI. Review of Systems  Objective: Vital Signs: Ht 5\' 10"  (1.778 m)   Wt 157 lb (71.2 kg)   BMI 22.53 kg/m   Specialty Comments:  No specialty comments available.  PMFS History: Patient Active Problem List   Diagnosis Date Noted  . TIA (transient ischemic attack) 01/05/2018  . History of TIA (transient ischemic attack) 05/03/2017  . Nocturia 05/19/2016  . Intermediate stage nonexudative age-related macular degeneration of both eyes 04/16/2016  . Pain in joint, shoulder region 10/29/2015  . 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  . Paresthesia 04/30/2015  . Anisocoria 10/30/2014  . Hammer toe 04/24/2014  . Pain in knee 04/24/2014  . Seborrheic keratosis 04/24/2014  . Cervicalgia 04/25/2013  . 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 Cyclitis, chronic 11/18/2012  . Essential hypertension 10/18/2012  . Hyperlipidemia 10/18/2012  . 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 Pain in joint, pelvic region and thigh 10/18/2012  . Other specified cardiac dysrhythmias(427.89) 10/18/2012  . 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 Rosacea 10/18/2012  . Actinic keratosis 10/18/2012  . 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 Atrophy, Fuchs' 12/10/2011  . Primary open angle glaucoma 09/25/2011   Past Medical History:  Diagnosis Date  . Actinic keratosis 10/18/2012  . Anisocoria 10/30/2014   Left pupil is larger  than the right postsurgery corneal transplant.   . Atrophy, Fuchs' 12/10/2011  . 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 Cervicalgia 04/25/2013  . 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 Cyclitis, chronic 11/18/2012  . Deafness 10/18/2012  . Endothelial corneal dystrophy   . Essential hypertension 10/18/2012  . Hammer toe 04/24/2014  . Hyperlipidemia 10/18/2012  . Intermediate stage nonexudative age-related macular degeneration of both eyes 04/16/2016  . Nocturia 05/19/2016  . Occlusion and stenosis of carotid artery with cerebral infarction   . Other  specified cardiac dysrhythmias(427.89)   . Pain in joint, pelvic region and thigh   . Paresthesia 04/30/2015   Toes of both feet   . Peripheral vascular disease, unspecified ([place])   . Primary open angle glaucoma 09/25/2011  . 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  . Rosacea   . Seborrheic keratosis 04/24/2014  . Syncope 06/27/2013  . TIA (transient ischemic attack) 10/18/2012   States he has had about 5  times  . Urine frequency     Family History  Problem Relation Age of Onset  . Cancer Mother   . Heart disease Father     Past Surgical History:  Procedure Laterality Date  . CATARACT EXTRACTION  2005   bilateral  . CORNEAL TRANSPLANT  2010 left; 2013 right eye  . HERNIA REPAIR  1985   Left  . HERNIA REPAIR  03/2004   Right  . SHOULDER DEBRIDEMENT Left 2005  . TONSILLECTOMY Bilateral childhood  . TRANSURETHRAL RESECTION OF PROSTATE N/A 10/2003  . VASECTOMY     Social History   Occupational History  . Occupation: retired Insurance claims handler  Tobacco Use  . Smoking status: Former Smoker    Packs/day: 1.00    Years: 25.00    Pack years: 25.00    Last attempt to quit: 10/18/1964    Years since quitting: 53.3  . Smokeless tobacco: Never Used  . Tobacco comment: quit 1966  Substance and Sexual Activity  . Alcohol use: No    Alcohol/week: 0.6 oz    Types: 1 Glasses of wine per week    Frequency: Never  . Drug use: No  . Sexual activity: Not on file

## 2018-02-24 DIAGNOSIS — Z23 Encounter for immunization: Secondary | ICD-10-CM | POA: Diagnosis not present

## 2018-03-02 ENCOUNTER — Encounter (INDEPENDENT_AMBULATORY_CARE_PROVIDER_SITE_OTHER): Payer: Self-pay | Admitting: Orthopedic Surgery

## 2018-03-02 ENCOUNTER — Encounter: Payer: Self-pay | Admitting: Internal Medicine

## 2018-03-04 DIAGNOSIS — M79672 Pain in left foot: Secondary | ICD-10-CM | POA: Diagnosis not present

## 2018-03-04 DIAGNOSIS — M79671 Pain in right foot: Secondary | ICD-10-CM | POA: Diagnosis not present

## 2018-03-04 DIAGNOSIS — B351 Tinea unguium: Secondary | ICD-10-CM | POA: Diagnosis not present

## 2018-03-14 ENCOUNTER — Encounter: Payer: Self-pay | Admitting: Neurology

## 2018-03-14 ENCOUNTER — Ambulatory Visit (INDEPENDENT_AMBULATORY_CARE_PROVIDER_SITE_OTHER): Payer: Medicare Other | Admitting: Neurology

## 2018-03-14 VITALS — BP 147/69 | HR 79 | Ht 69.0 in | Wt 156.8 lb

## 2018-03-14 DIAGNOSIS — G43009 Migraine without aura, not intractable, without status migrainosus: Secondary | ICD-10-CM

## 2018-03-14 DIAGNOSIS — H539 Unspecified visual disturbance: Secondary | ICD-10-CM

## 2018-03-14 NOTE — Patient Instructions (Addendum)
I had a long discussion with the patient regarding his recurrent stereotypical episodes of visual disturbance followed by speech difficulties likely representing atypical migraines.I feel the episodes are not as frequent or disabling enough at the present time to justify a trial of prophylactic medications at the present time. He will call me if episodes increase in frequency or severity. Recommend check EEG for seizure activity. He was advised to keep her episode IV in to avoid triggers if possible. He will return for follow-up in 3 months with my nurse practitioner Janett Billow or call earlier if necessary.

## 2018-03-14 NOTE — Progress Notes (Signed)
[facility] [address] [place]. [place] [address] [phone]       OFFICE CONSULT NOTE  [name] Date of Birth:  07-Apr-1927 Medical Record Number:  [id number]   Referring MD:  Remi Haggard Aroor Reason for Referral:  Vision disturbance HPI: [name] is a pleasant 84 year old Caucasian male seen today for initial office consultation visit for recurrent episodes of vision disturbances.History is obtained from the patient and review of electronic medical records. I personally reviewed imaging films..he states that his had recurrent stereotypical episodes of vision and speech disturbance stating back to 2006. The episode begins with a moving check her type patent in the periphery of the right eye this spreads slowly and he soon has trouble finding words and forming sentences. This may last from 20 minutes to maximum up to 1 hour.He is unable to identify any specific triggers or precipitating factors. He denies any accompanying headache before during or after these episodes. These occur on a variable frequency and he states he had a total of 8-10 such episodes in the last 13 years. His last episode occurred on 01/05/2018 when he was seen in the emergency room and admitted for workup. I personally reviewed the MRI scan which shows age-appropriate changes of small vessel disease and no acute infarct.CT angiogram of the brain and neck did not show any significant large vessel stenosis or occlusion.  Transthoracic echo was unremarkable. LDL cholesterol was 93 mg percent.ESR was not elevated. Patient states she's had no further episodes since the last admission 200 of months ago. He has no prior history of definite documented TIAs or strokes or even history of migraine headaches in the remote past. He is on aspirin 325 which is tolerating well without bruising or bleeding. He is on Lipitor and Vasotec 10 tolerating both medications well without any side effects. The patient is anxious  and scared that he may have a stroke but denies any other episode besides these recurrent stereotypical once. He did have an EEG in the hospital which was unremarkable. He has no history of definite seizures significant head injury with loss of consciousness.  ROS:   14 system review of systems is positive for  Hearing loss, joint pain, easy bruising, vision disturbance, speech difficulty and all other systems negative  PMH:  Past Medical History:  Diagnosis Date  . Actinic keratosis 10/18/2012  . 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   . Atrophy, Fuchs' 12/10/2011  . 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 Cervicalgia 04/25/2013  . 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 Cyclitis, chronic 11/18/2012  . Deafness 10/18/2012  . Endothelial corneal dystrophy   . Essential hypertension 10/18/2012  . Hammer toe 04/24/2014  . Hyperlipidemia 10/18/2012  . Intermediate stage nonexudative age-related macular degeneration of both eyes 04/16/2016  . Nocturia 05/19/2016  . Occlusion and stenosis of carotid artery with cerebral infarction   . Other specified cardiac dysrhythmias(427.89)   . Pain in joint, pelvic region and thigh   . Paresthesia 04/30/2015   Toes of both feet   . Peripheral vascular disease, unspecified ([place])   . Primary open angle glaucoma 09/25/2011  . 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  . Rosacea   . Seborrheic keratosis 04/24/2014  . Syncope 06/27/2013  . TIA (transient ischemic attack) 10/18/2012   States he has had about 5  times  . Urine frequency     Social History:  Social History   Socioeconomic History  . Marital  status: Widowed    Spouse name: Not on file  . Number of children: Not on file  . Years of education: Not on file  . Highest education level: Not on file  Occupational History  . Occupation: retired Insurance claims handler  Social Needs  . Financial resource strain: Not hard at all  . Food insecurity:    Worry: Never  true    Inability: Never true  . Transportation needs:    Medical: No    Non-medical: No  Tobacco Use  . Smoking status: Former Smoker    Packs/day: 1.00    Years: 25.00    Pack years: 25.00    Last attempt to quit: 10/18/1964    Years since quitting: 53.4  . Smokeless tobacco: Never Used  . Tobacco comment: quit 1966  Substance and Sexual Activity  . Alcohol use: No    Alcohol/week: 1.0 standard drinks    Types: 1 Glasses of wine per week    Frequency: Never  . Drug use: No  . Sexual activity: Not on file  Lifestyle  . Physical activity:    Days per week: 5 days    Minutes per session: 10 min  . Stress: Not at all  Relationships  . Social connections:    Talks on phone: More than three times a week    Gets together: More than three times a week    Attends religious service: 1 to 4 times per year    Active member of club or organization: Yes    Attends meetings of clubs or organizations: 1 to 4 times per year    Relationship status: Widowed  . Intimate partner violence:    Fear of current or ex partner: No    Emotionally abused: No    Physically abused: No    Forced sexual activity: No  Other Topics Concern  . Not on file  Social History Narrative   Lives at [facility] since 08/21/2011   Married to [place] Will   Exercise: 3 x week bicycle   Previously smoked stopped 1966   Does not drink cafferinated beverage   Drinks one small glass of wine few nights a week     Medications:   Current Outpatient Medications on File Prior to Visit  Medication Sig Dispense Refill  . aspirin 325 MG tablet Take 325 mg by mouth every evening.    Marland Kitchen atorvastatin (LIPITOR) 40 MG tablet Take 1 tablet (40 mg total) by mouth daily at 6 PM. 90 tablet 3  . dorzolamide (TRUSOPT) 2 % ophthalmic solution Place 1 drop into both eyes 2 (two) times daily.    . enalapril (VASOTEC) 20 MG tablet TAKE 1 TABLET TWICE A DAY TO CONTROL BLOOD PRESSURE 180 tablet 1  . FLAXSEED, LINSEED,  PO Take by mouth.    . loratadine (CLARITIN) 10 MG tablet Take 10 mg by mouth daily.    . Multiple Vitamins-Minerals (PRESERVISION AREDS 2) CAPS Take 1 capsule by mouth 2 (two) times daily.    . Omega-3 Fatty Acids (FISH OIL) 1000 MG CAPS Take 1,000 mg by mouth 2 (two) times daily.     . timolol (BETIMOL) 0.5 % ophthalmic solution Place 1 drop into both eyes 2 (two) times daily.    . Travoprost, BAK Free, (TRAVATAN Z) 0.004 % SOLN ophthalmic solution Place 1 drop into the left eye at bedtime. One drop left eye once a day for glaucoma  No current facility-administered medications on file prior to visit.     Allergies:  No Known Allergies  Physical Exam General: well developed, well nourished pleasant elderly Caucasian male, seated, in no evident distress Head: head normocephalic and atraumatic.   Neck: supple with no carotid or supraclavicular bruits Cardiovascular: regular rate and rhythm, no murmurs Musculoskeletal: no deformity Skin:  no rash/petichiae.2 cm frontal scalp sebaceous cyst Vascular:  Normal pulses all extremities  Neurologic Exam Mental Status: Awake and fully alert. Oriented to place and time. Recent and remote memory intact. Attention span, concentration and fund of knowledge appropriate. Mood and affect appropriate.  Cranial Nerves: Fundoscopic exam reveals sharp disc margins. Pupils unequal,  The left larger and postsurgical and right smaller in both minimally reactive to light. Extraocular movements full without nystagmus. Visual fields full to confrontation. Hearing significantly reduced bilaterally.t. Facial sensation intact. Face, tongue, palate moves normally and symmetrically.  Motor: Normal bulk and tone. Normal strength in all tested extremity muscles. Sensory.: intact to touch , pinprick , position and vibratory sensation.  Coordination: Rapid alternating movements normal in all extremities. Finger-to-nose and heel-to-shin performed accurately  bilaterally. Gait and Station: Arises from chair without difficulty. Stance is normal. Gait demonstrates normal stride length and balance . Unable to heel, toe and tandem walk without difficulty.  Reflexes: 1+ and symmetric. Toes downgoing.   NIHSS  0 Modified Rankin  0   ASSESSMENT: 84 year old pleasant Caucasian male with recurrent stereotypical episodes of vision disturbance followed by speech language difficulties likely atypical or complicated migraine episodes. Simple partial seizures or TIA is less likely.    PLAN: I had a long discussion with the patient regarding his recurrent stereotypical episodes of visual disturbance followed by speech difficulties likely representing atypical migraines.I feel the episodes are not as frequent or disabling enough at the present time to justify a trial of prophylactic medications at the present time. He will call me if episodes increase in frequency or severity. Recommend check EEG for seizure activity. He was advised to keep a written diary of events and  to avoid triggers if possible. Continue aspirin for stroke prevention and strict control of hypertension and lipids.Greater than 50% time during this 45 minute consultation visit was spent on counseling and coordination of care about his episodes of visual and speech disturbance, discussion about cognitive migraine and answering questionsHe will return for follow-up in 3 months with my nurse practitioner Janett Billow or call earlier if necessary. Antony Contras, MD  [facility] [address] [place], [place] [address]  Phone [phone] Fax [phone] Note: This document was prepared with digital dictation and possible smart phrase technology. Any transcriptional errors that result from this process are unintentional.

## 2018-03-15 ENCOUNTER — Ambulatory Visit (INDEPENDENT_AMBULATORY_CARE_PROVIDER_SITE_OTHER): Payer: Medicare Other | Admitting: Neurology

## 2018-03-15 ENCOUNTER — Other Ambulatory Visit: Payer: Self-pay

## 2018-03-15 DIAGNOSIS — E785 Hyperlipidemia, unspecified: Secondary | ICD-10-CM

## 2018-03-15 DIAGNOSIS — R2990 Unspecified symptoms and signs involving the nervous system: Secondary | ICD-10-CM

## 2018-03-15 DIAGNOSIS — H539 Unspecified visual disturbance: Secondary | ICD-10-CM

## 2018-03-15 DIAGNOSIS — I10 Essential (primary) hypertension: Secondary | ICD-10-CM

## 2018-03-15 DIAGNOSIS — Z8673 Personal history of transient ischemic attack (TIA), and cerebral infarction without residual deficits: Secondary | ICD-10-CM

## 2018-03-15 DIAGNOSIS — G43009 Migraine without aura, not intractable, without status migrainosus: Secondary | ICD-10-CM

## 2018-03-22 ENCOUNTER — Telehealth: Payer: Self-pay

## 2018-03-22 NOTE — Telephone Encounter (Signed)
Pt has returned call to [place].  Message was relayed to pt that his EEG study was normal.  Pt has no questions but welcomes RN Katrina to call back if more information needs to be relayed to him

## 2018-03-22 NOTE — Telephone Encounter (Signed)
Notes recorded by Marval Regal, RN on 03/22/2018 at 10:15 AM EDT Left vm for patient to call back about EEG results. ------

## 2018-03-22 NOTE — Telephone Encounter (Signed)
-----   Message from Garvin Fila, MD sent at 03/18/2018 11:47 AM EDT ----- Mitchell Heir inform the patient that EEG study was normal

## 2018-04-22 DIAGNOSIS — I10 Essential (primary) hypertension: Secondary | ICD-10-CM | POA: Diagnosis not present

## 2018-04-22 DIAGNOSIS — E785 Hyperlipidemia, unspecified: Secondary | ICD-10-CM | POA: Diagnosis not present

## 2018-04-22 DIAGNOSIS — Z8673 Personal history of transient ischemic attack (TIA), and cerebral infarction without residual deficits: Secondary | ICD-10-CM | POA: Diagnosis not present

## 2018-04-25 DIAGNOSIS — I10 Essential (primary) hypertension: Secondary | ICD-10-CM

## 2018-04-25 DIAGNOSIS — Z8673 Personal history of transient ischemic attack (TIA), and cerebral infarction without residual deficits: Secondary | ICD-10-CM

## 2018-04-25 DIAGNOSIS — E785 Hyperlipidemia, unspecified: Secondary | ICD-10-CM

## 2018-04-25 LAB — CBC
HEMATOCRIT: 42.3 % (ref 38.5–50.0)
Hemoglobin: 14.4 g/dL (ref 13.2–17.1)
MCH: 29.8 pg (ref 27.0–33.0)
MCHC: 34 g/dL (ref 32.0–36.0)
MCV: 87.6 fL (ref 80.0–100.0)
MPV: 10 fL (ref 7.5–12.5)
Platelets: 184 10*3/uL (ref 140–400)
RBC: 4.83 10*6/uL (ref 4.20–5.80)
RDW: 13.1 % (ref 11.0–15.0)
WBC: 6.5 10*3/uL (ref 3.8–10.8)

## 2018-04-25 LAB — COMPLETE METABOLIC PANEL WITH GFR
AG RATIO: 2.3 (calc) (ref 1.0–2.5)
ALT: 18 U/L (ref 9–46)
AST: 21 U/L (ref 10–35)
Albumin: 3.9 g/dL (ref 3.6–5.1)
Alkaline phosphatase (APISO): 49 U/L (ref 40–115)
BUN: 22 mg/dL (ref 7–25)
CALCIUM: 8.6 mg/dL (ref 8.6–10.3)
CO2: 28 mmol/L (ref 20–32)
CREATININE: 0.88 mg/dL (ref 0.70–1.11)
Chloride: 104 mmol/L (ref 98–110)
GFR, EST NON AFRICAN AMERICAN: 76 mL/min/{1.73_m2} (ref >=60)
GFR, Est African American: 88 mL/min/{1.73_m2} (ref >=60)
Globulin: 1.7 g/dL (calc) — ABNORMAL LOW (ref 1.9–3.7)
Glucose, Bld: 81 mg/dL (ref 65–99)
Potassium: 4.2 mmol/L (ref 3.5–5.3)
Sodium: 139 mmol/L (ref 135–146)
TOTAL PROTEIN: 5.6 g/dL — AB (ref 6.1–8.1)
Total Bilirubin: 0.9 mg/dL (ref 0.2–1.2)

## 2018-04-25 LAB — LIPID PANEL
Cholesterol: 114 mg/dL (ref <200)
HDL: 38 mg/dL — ABNORMAL LOW (ref >40)
LDL Cholesterol (Calc): 61 mg/dL (calc)
NON-HDL CHOLESTEROL (CALC): 76 mg/dL (ref <130)
Total CHOL/HDL Ratio: 3 (calc) (ref <5.0)
Triglycerides: 73 mg/dL (ref <150)

## 2018-04-26 ENCOUNTER — Telehealth: Payer: Self-pay

## 2018-04-26 DIAGNOSIS — E7800 Pure hypercholesterolemia, unspecified: Secondary | ICD-10-CM

## 2018-04-26 DIAGNOSIS — E785 Hyperlipidemia, unspecified: Secondary | ICD-10-CM

## 2018-04-26 DIAGNOSIS — I10 Essential (primary) hypertension: Secondary | ICD-10-CM

## 2018-04-26 DIAGNOSIS — Z8673 Personal history of transient ischemic attack (TIA), and cerebral infarction without residual deficits: Secondary | ICD-10-CM

## 2018-04-26 MED ORDER — ATORVASTATIN CALCIUM 20 MG PO TABS
20.0000 mg | ORAL_TABLET | Freq: Every day | ORAL | 1 refills | Status: DC
Start: 2018-04-26 — End: 2018-04-26

## 2018-04-26 MED ORDER — ATORVASTATIN CALCIUM 20 MG PO TABS: 20.0000 mg | ORAL_TABLET | Freq: Every day | ORAL | 1 refills | Status: DC

## 2018-04-26 NOTE — Telephone Encounter (Signed)
Notes recorded by Sandrea Hughs, NP on 04/25/2018 at 2:52 PM EDT 1. CBC within normal range no signs of infection or anemia 2. Lipid panel LDL at goal continue on low carbohydrate,low saturated fats high vegetable diet.continue Omega 3 fatty acid capsule.Reduce Atorvastatin from 40 mg tablet to 20 mg tablet daily. 3. Protein level slightly low encourage balance diet or drink boost one can daily. 4. Recheck lab work in three months.

## 2018-04-26 NOTE — Telephone Encounter (Signed)
I called [facility] and canceled RX that Melissa Wilson/CMA sent in [place] per verbal conversation with Melissa.  RX sent to [facility]   I will call patient tomorrow to schedule 3 month appointment to recheck labs

## 2018-04-26 NOTE — Telephone Encounter (Signed)
-----   Message from Sandrea Hughs, NP sent at 04/25/2018  2:52 PM EDT ----- 1. CBC within normal range no signs of infection or anemia 2. Lipid panel LDL at goal continue on low carbohydrate,low saturated fats high vegetable diet.continue Omega 3 fatty acid capsule.Reduce Atorvastatin from 40 mg tablet to 20 mg tablet daily. 3. Protein level slightly low encourage balance diet or drink boost one can daily. 4. Recheck lab work in three months.

## 2018-04-27 ENCOUNTER — Telehealth: Payer: Self-pay

## 2018-04-27 NOTE — Telephone Encounter (Signed)
Left message on voicemail for patient to return call when available. Reason for call: Inform patient that he needs to schedule a 3 month fasting lab appointment for [facility] to recheck labs.  Future orders placed

## 2018-04-27 NOTE — Telephone Encounter (Signed)
Error

## 2018-05-02 ENCOUNTER — Encounter: Payer: Self-pay | Admitting: Internal Medicine

## 2018-05-03 ENCOUNTER — Non-Acute Institutional Stay: Payer: Medicare Other | Admitting: Family

## 2018-05-03 ENCOUNTER — Encounter: Payer: Self-pay | Admitting: Family

## 2018-05-03 VITALS — BP 140/82 | HR 96 | Temp 97.8°F | Resp 18 | Ht 69.0 in | Wt 131.4 lb

## 2018-05-03 DIAGNOSIS — E785 Hyperlipidemia, unspecified: Secondary | ICD-10-CM

## 2018-05-03 DIAGNOSIS — R29898 Other symptoms and signs involving the musculoskeletal system: Secondary | ICD-10-CM | POA: Diagnosis not present

## 2018-05-03 DIAGNOSIS — I10 Essential (primary) hypertension: Secondary | ICD-10-CM

## 2018-05-03 DIAGNOSIS — Z8673 Personal history of transient ischemic attack (TIA), and cerebral infarction without residual deficits: Secondary | ICD-10-CM | POA: Diagnosis not present

## 2018-05-03 DIAGNOSIS — M2040 Other hammer toe(s) (acquired), unspecified foot: Secondary | ICD-10-CM | POA: Diagnosis not present

## 2018-05-03 NOTE — Patient Instructions (Signed)
1.Script order written for Physical therapy to evaluate right lateral foot weakness. 2.Follow up in 3 months lab work one week prior to visit.

## 2018-05-03 NOTE — Progress Notes (Signed)
Location:  [facility] (12) Provider: Dinah Ngetich FNP-C   Ngetich, Nelda Bucks, NP  Patient Care Team: Ngetich, Nelda Bucks, NP as PCP - General (Family Medicine) [place], [facility] Crista Luria, MD as Consulting Physician (Dermatology) Marilynne Halsted, MD as Referring Physician (Ophthalmology) Bond, Tracie Harrier, MD as Referring Physician (Ophthalmology)  Extended Emergency Contact Information Primary Emergency Contact: Osvaldo Human of [place] Phone: [phone] Relation: Son   Goals of care: Advanced Directive information Advanced Directives 05/03/2018  Does Patient Have a Medical Advance Directive? Yes  Type of Advance Directive [place]  Does patient want to make changes to medical advance directive? No - Patient declined  Copy of [place] in Chart? Yes  Would patient like information on creating a medical advance directive? -  Pre-existing out of facility DNR order (yellow form or pink MOST form) Pink MOST form placed in chart (order not valid for inpatient use);Yellow form placed in chart (order not valid for inpatient use)     Chief Complaint  Patient presents with  . Medical Management of Chronic Issues    MMSE 28/30 passed clock test, EKG, reviewed labs    HPI:  Pt is a 84 y.o. male seen today [facility] for medical management of chronic diseases.He denies any acute issues during visit.  Hypertension - currently on enalapril 20 mg tablet daily.He denies any headache,dizziness,nausea,vomiting,shortness of breath or chest pain.  Hyperlipidemia - His recent LDL was 61 at goal recommended reducing  Atorvastatin from 40 mg tablet to  20 mg tablet daily.Also on fish oil 1 Gm twice daily.discussed low carbohydrate,low saturated fats and high vegetable diet.  Hammer toe  - painful with current shoes.No wound or drainage on toes reported.Has follow up appointment  with podiatry.    Right foot weakness -chronic but has worsen.  Hx of TIA - on ACE inhibitor,statin,fish oil and Asprin daily.     Past Medical History:  Diagnosis Date  . Actinic keratosis 10/18/2012  . 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   . Atrophy, Fuchs' 12/10/2011  . 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 Cervicalgia 04/25/2013  . 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 Cyclitis, chronic 11/18/2012  . Deafness 10/18/2012  . Endothelial corneal dystrophy   . Essential hypertension 10/18/2012  . Hammer toe 04/24/2014  . Hyperlipidemia 10/18/2012  . Intermediate stage nonexudative age-related macular degeneration of both eyes 04/16/2016  . Nocturia 05/19/2016  . Occlusion and stenosis of carotid artery with cerebral infarction   . Other specified cardiac dysrhythmias(427.89)   . Pain in joint, pelvic region and thigh   . Paresthesia 04/30/2015   Toes of both feet   . Peripheral vascular disease, unspecified ([place])   . Primary open angle glaucoma 09/25/2011  . 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  . Rosacea   . Seborrheic keratosis 04/24/2014  . Syncope 06/27/2013  . TIA (transient ischemic attack) 10/18/2012   States he has had about 5  times  . Urine frequency    Past Surgical History:  Procedure Laterality Date  . CATARACT EXTRACTION  2005   bilateral  . CORNEAL TRANSPLANT  2010 left; 2013 right eye  . HERNIA REPAIR  1985   Left  . HERNIA REPAIR  03/2004   Right  . SHOULDER DEBRIDEMENT Left 2005  . TONSILLECTOMY Bilateral childhood  . TRANSURETHRAL RESECTION OF PROSTATE N/A 10/2003  .  VASECTOMY      No Known Allergies  Allergies as of 05/03/2018   No Known Allergies     Medication List        Accurate as of 05/03/18  3:15 PM. Always use your most recent med list.          aspirin 325 MG tablet Take 325 mg by mouth every evening.   atorvastatin 20 MG tablet Commonly known as:  LIPITOR Take 1 tablet (20 mg total) by  mouth daily.   dorzolamide 2 % ophthalmic solution Commonly known as:  TRUSOPT Place 1 drop into both eyes 2 (two) times daily.   enalapril 20 MG tablet Commonly known as:  VASOTEC TAKE 1 TABLET TWICE A DAY TO CONTROL BLOOD PRESSURE   Fish Oil 1000 MG Caps Take 1,000 mg by mouth 2 (two) times daily.   loratadine 10 MG tablet Commonly known as:  CLARITIN Take 10 mg by mouth daily.   PRESERVISION AREDS 2 Caps Take 1 capsule by mouth 2 (two) times daily.   timolol 0.5 % ophthalmic solution Commonly known as:  BETIMOL Place 1 drop into both eyes 2 (two) times daily.   TRAVATAN Z 0.004 % Soln ophthalmic solution Generic drug:  Travoprost (BAK Free) Place 1 drop into the left eye at bedtime. One drop left eye once a day for glaucoma       Review of Systems  Constitutional: Negative for appetite change, chills, fatigue, fever and unexpected weight change.  HENT: Positive for hearing loss. Negative for congestion, rhinorrhea, sinus pressure, sinus pain, sneezing, sore throat and trouble swallowing.   Eyes: Positive for visual disturbance. Negative for discharge, redness and itching.  Respiratory: Negative for cough, chest tightness, shortness of breath and wheezing.   Cardiovascular: Negative for chest pain, palpitations and leg swelling.  Gastrointestinal: Negative for abdominal distention, abdominal pain, constipation, diarrhea, nausea and vomiting.  Endocrine: Negative for cold intolerance, heat intolerance, polydipsia, polyphagia and polyuria.  Genitourinary: Negative for dysuria, flank pain, frequency and urgency.  Musculoskeletal: Negative for arthralgias, gait problem and joint swelling.  Skin: Negative for color change, pallor, rash and wound.  Neurological: Negative for dizziness, light-headedness and headaches.  Hematological: Does not bruise/bleed easily.  Psychiatric/Behavioral: Negative for agitation, confusion and sleep disturbance. The patient is not  nervous/anxious.     Immunization History  Administered Date(s) Administered  . Influenza Whole 04/19/2012, 04/20/2013  . Influenza-Unspecified 05/03/2014, 04/18/2015, 04/30/2016, 04/18/2017  . Pneumococcal Conjugate-13 03/20/2010  . Pneumococcal Polysaccharide-23 11/02/2017  . Td 02/24/2018  . Tdap 06/27/2011  . Zoster Recombinat (Shingrix) 12/24/2017, 03/09/2018   Pertinent  Health Maintenance Due  Topic Date Due  . INFLUENZA VACCINE  02/17/2018  . PNA vac Low Risk Adult  Completed   Fall Risk  05/03/2018 03/14/2018 06/25/2017 01/25/2017 12/25/2016  Falls in the past year? No No No No No    Vitals:   05/03/18 1355  BP: 140/82  Pulse: 96  Resp: 18  Temp: 97.8 F (36.6 C)  TempSrc: Oral  SpO2: 99%  Weight: 131 lb 6.4 oz (59.6 kg)  Height: 5\' 9"  (1.753 m)   Body mass index is 19.4 kg/m. Physical Exam  Constitutional: He is oriented to person, place, and time. He appears well-developed and well-nourished.  Tall elderly in no acute distress   HENT:  Head: Normocephalic.  Right Ear: External ear normal.  Left Ear: External ear normal.  Mouth/Throat: Oropharynx is clear and moist. No oropharyngeal exudate.  Eyes: Conjunctivae are normal. Right eye  exhibits no discharge. Left eye exhibits no discharge. No scleral icterus.  Pupil reactive to light left chronically > the right   Neck: Normal range of motion. No JVD present. No thyromegaly present.  Cardiovascular: Regular rhythm, normal heart sounds and intact distal pulses. Bradycardia present. Exam reveals no gallop and no friction rub.  No murmur heard. EKG sinus bradycardia at 57 b/min  Pulmonary/Chest: Effort normal and breath sounds normal. No respiratory distress. He has no wheezes. He has no rales.  Abdominal: Soft. Bowel sounds are normal. He exhibits no distension and no mass. There is no tenderness. There is no rebound and no guarding.  Musculoskeletal: Normal range of motion. He exhibits no edema or tenderness.    Hammer toe without any signs of infection   Lymphadenopathy:    He has no cervical adenopathy.  Neurological: He is oriented to person, place, and time.  MMSE scored 28/30 this visit.   Skin: Skin is warm and dry. No rash noted. No erythema. No pallor.  Psychiatric: He has a normal mood and affect. His speech is normal and behavior is normal. Judgment and thought content normal.  MMSE scored 28/30 during visit today.     Labs reviewed: Recent Labs    10/25/17 0755 01/05/18 1201 01/05/18 1234 04/22/18 0000  NA 142 136 139 139  K 4.0 3.7 3.9 4.2  CL 106 104 101 104  CO2 31 25  --  28  GLUCOSE 89 89 83 81  BUN 19 14 15 22   CREATININE 0.90 0.79 0.80 0.88  CALCIUM 8.7 8.7*  --  8.6   Recent Labs    08/23/17 0000 01/05/18 1201 04/22/18 0000  AST 22 28 21   ALT 15 24 18   ALKPHOS  --  48  --   BILITOT 0.7 1.1 0.9  PROT 5.9* 5.9* 5.6*  ALBUMIN  --  3.7  --    Recent Labs    08/23/17 0000 01/05/18 1201 01/05/18 1234 04/22/18 0000  WBC 7.0 7.6  --  6.5  NEUTROABS 4,424 4.5  --   --   HGB 15.2 15.1 13.9 14.4  HCT 43.2 44.8 41.0 42.3  MCV 86.4 88.7  --  87.6  PLT 176 152  --  184   Lab Results  Component Value Date   TSH 2.52 04/26/2017   No results found for: HGBA1C Lab Results  Component Value Date   CHOL 114 04/22/2018   HDL 38 (L) 04/22/2018   LDLCALC 61 04/22/2018   TRIG 73 04/22/2018   CHOLHDL 3.0 04/22/2018    Significant Diagnostic Results in last 30 days:  No results found.  Assessment/Plan 1. Essential hypertension B/p stable.continue on enalapril 20 mg tablet daily.  - EKG 12-Lead reviewed Sinus bradycardia no changes from previous EKG.   2. Hyperlipidemia, unspecified hyperlipidemia type LDL at goal.Reduce atorvastatin to 20 mg tablet daily.Recheck lipid panel in 3 months.Encouraged low carbohydrates,low saturated fats and high vegetables diet.continue to participate in facility exercise classes.    3. Hammer toe, unspecified  laterality Follow up with podiatrist as directed.   4. Weakness of right foot Chronic but has worsen.Physical therapy to evaluate and treat as indicated.fall and safety precautions discussed.   5. History of TIA (transient ischemic attack) Asymptomatic.continue on Statin,ASA,ACE inhibitor and low carbohydrates,low saturated fats and high vegetables diet and exercise.  Family/ staff Communication: Reviewed plan of care with patient.  Labs/tests ordered: CBC/diff,CMP and Lipid panel to be done prior to 3 months follow up visit.  Follow up : 3 months   Sandrea Hughs, NP

## 2018-05-12 DIAGNOSIS — M25571 Pain in right ankle and joints of right foot: Secondary | ICD-10-CM | POA: Diagnosis not present

## 2018-05-12 DIAGNOSIS — R29898 Other symptoms and signs involving the musculoskeletal system: Secondary | ICD-10-CM | POA: Diagnosis not present

## 2018-05-12 DIAGNOSIS — R2681 Unsteadiness on feet: Secondary | ICD-10-CM | POA: Diagnosis not present

## 2018-05-12 DIAGNOSIS — R2689 Other abnormalities of gait and mobility: Secondary | ICD-10-CM | POA: Diagnosis not present

## 2018-05-13 DIAGNOSIS — B351 Tinea unguium: Secondary | ICD-10-CM | POA: Diagnosis not present

## 2018-05-13 DIAGNOSIS — M79672 Pain in left foot: Secondary | ICD-10-CM | POA: Diagnosis not present

## 2018-05-13 DIAGNOSIS — M79671 Pain in right foot: Secondary | ICD-10-CM | POA: Diagnosis not present

## 2018-05-18 DIAGNOSIS — R2681 Unsteadiness on feet: Secondary | ICD-10-CM | POA: Diagnosis not present

## 2018-05-18 DIAGNOSIS — M25571 Pain in right ankle and joints of right foot: Secondary | ICD-10-CM | POA: Diagnosis not present

## 2018-05-18 DIAGNOSIS — R2689 Other abnormalities of gait and mobility: Secondary | ICD-10-CM | POA: Diagnosis not present

## 2018-05-18 DIAGNOSIS — R29898 Other symptoms and signs involving the musculoskeletal system: Secondary | ICD-10-CM | POA: Diagnosis not present

## 2018-05-19 DIAGNOSIS — R2681 Unsteadiness on feet: Secondary | ICD-10-CM | POA: Diagnosis not present

## 2018-05-19 DIAGNOSIS — M25571 Pain in right ankle and joints of right foot: Secondary | ICD-10-CM | POA: Diagnosis not present

## 2018-05-19 DIAGNOSIS — R2689 Other abnormalities of gait and mobility: Secondary | ICD-10-CM | POA: Diagnosis not present

## 2018-05-19 DIAGNOSIS — R29898 Other symptoms and signs involving the musculoskeletal system: Secondary | ICD-10-CM | POA: Diagnosis not present

## 2018-06-01 DIAGNOSIS — M6281 Muscle weakness (generalized): Secondary | ICD-10-CM | POA: Diagnosis not present

## 2018-06-01 DIAGNOSIS — R2689 Other abnormalities of gait and mobility: Secondary | ICD-10-CM | POA: Diagnosis not present

## 2018-06-01 DIAGNOSIS — L821 Other seborrheic keratosis: Secondary | ICD-10-CM | POA: Diagnosis not present

## 2018-06-01 DIAGNOSIS — D1801 Hemangioma of skin and subcutaneous tissue: Secondary | ICD-10-CM | POA: Diagnosis not present

## 2018-06-01 DIAGNOSIS — M25571 Pain in right ankle and joints of right foot: Secondary | ICD-10-CM | POA: Diagnosis not present

## 2018-06-01 DIAGNOSIS — L570 Actinic keratosis: Secondary | ICD-10-CM | POA: Diagnosis not present

## 2018-06-01 DIAGNOSIS — R2681 Unsteadiness on feet: Secondary | ICD-10-CM | POA: Diagnosis not present

## 2018-06-06 DIAGNOSIS — M6281 Muscle weakness (generalized): Secondary | ICD-10-CM | POA: Diagnosis not present

## 2018-06-06 DIAGNOSIS — R2689 Other abnormalities of gait and mobility: Secondary | ICD-10-CM | POA: Diagnosis not present

## 2018-06-06 DIAGNOSIS — M25571 Pain in right ankle and joints of right foot: Secondary | ICD-10-CM | POA: Diagnosis not present

## 2018-06-06 DIAGNOSIS — R2681 Unsteadiness on feet: Secondary | ICD-10-CM | POA: Diagnosis not present

## 2018-06-08 DIAGNOSIS — M6281 Muscle weakness (generalized): Secondary | ICD-10-CM | POA: Diagnosis not present

## 2018-06-08 DIAGNOSIS — R2681 Unsteadiness on feet: Secondary | ICD-10-CM | POA: Diagnosis not present

## 2018-06-08 DIAGNOSIS — R2689 Other abnormalities of gait and mobility: Secondary | ICD-10-CM | POA: Diagnosis not present

## 2018-06-08 DIAGNOSIS — M25571 Pain in right ankle and joints of right foot: Secondary | ICD-10-CM | POA: Diagnosis not present

## 2018-06-09 DIAGNOSIS — M6281 Muscle weakness (generalized): Secondary | ICD-10-CM | POA: Diagnosis not present

## 2018-06-09 DIAGNOSIS — R2681 Unsteadiness on feet: Secondary | ICD-10-CM | POA: Diagnosis not present

## 2018-06-09 DIAGNOSIS — M25571 Pain in right ankle and joints of right foot: Secondary | ICD-10-CM | POA: Diagnosis not present

## 2018-06-09 DIAGNOSIS — R2689 Other abnormalities of gait and mobility: Secondary | ICD-10-CM | POA: Diagnosis not present

## 2018-06-13 DIAGNOSIS — R2689 Other abnormalities of gait and mobility: Secondary | ICD-10-CM | POA: Diagnosis not present

## 2018-06-13 DIAGNOSIS — M6281 Muscle weakness (generalized): Secondary | ICD-10-CM | POA: Diagnosis not present

## 2018-06-13 DIAGNOSIS — M25571 Pain in right ankle and joints of right foot: Secondary | ICD-10-CM | POA: Diagnosis not present

## 2018-06-13 DIAGNOSIS — R2681 Unsteadiness on feet: Secondary | ICD-10-CM | POA: Diagnosis not present

## 2018-06-18 DIAGNOSIS — M6281 Muscle weakness (generalized): Secondary | ICD-10-CM | POA: Diagnosis not present

## 2018-06-18 DIAGNOSIS — R2689 Other abnormalities of gait and mobility: Secondary | ICD-10-CM | POA: Diagnosis not present

## 2018-06-18 DIAGNOSIS — R2681 Unsteadiness on feet: Secondary | ICD-10-CM | POA: Diagnosis not present

## 2018-06-18 DIAGNOSIS — M25571 Pain in right ankle and joints of right foot: Secondary | ICD-10-CM | POA: Diagnosis not present

## 2018-06-21 DIAGNOSIS — R2689 Other abnormalities of gait and mobility: Secondary | ICD-10-CM | POA: Diagnosis not present

## 2018-06-21 DIAGNOSIS — M6281 Muscle weakness (generalized): Secondary | ICD-10-CM | POA: Diagnosis not present

## 2018-06-21 DIAGNOSIS — R2681 Unsteadiness on feet: Secondary | ICD-10-CM | POA: Diagnosis not present

## 2018-06-21 DIAGNOSIS — M25571 Pain in right ankle and joints of right foot: Secondary | ICD-10-CM | POA: Diagnosis not present

## 2018-06-23 DIAGNOSIS — R2681 Unsteadiness on feet: Secondary | ICD-10-CM | POA: Diagnosis not present

## 2018-06-23 DIAGNOSIS — M6281 Muscle weakness (generalized): Secondary | ICD-10-CM | POA: Diagnosis not present

## 2018-06-23 DIAGNOSIS — R2689 Other abnormalities of gait and mobility: Secondary | ICD-10-CM | POA: Diagnosis not present

## 2018-06-23 DIAGNOSIS — M25571 Pain in right ankle and joints of right foot: Secondary | ICD-10-CM | POA: Diagnosis not present

## 2018-06-27 ENCOUNTER — Telehealth: Payer: Self-pay | Admitting: Family

## 2018-06-27 NOTE — Telephone Encounter (Signed)
I left a message asking the patient to call me at [phone] to schedule AWV with Clarise Cruz at [facility] on afternoon of 06/29/18 if available. VDM (DD)

## 2018-06-29 ENCOUNTER — Ambulatory Visit (INDEPENDENT_AMBULATORY_CARE_PROVIDER_SITE_OTHER): Payer: Medicare Other | Admitting: Adult Health

## 2018-06-29 ENCOUNTER — Encounter: Payer: Self-pay | Admitting: Adult Health

## 2018-06-29 ENCOUNTER — Non-Acute Institutional Stay: Payer: Medicare Other

## 2018-06-29 VITALS — BP 159/82 | HR 56 | Ht 69.0 in | Wt 160.8 lb

## 2018-06-29 VITALS — BP 142/68 | HR 74 | Temp 97.8°F | Ht 69.0 in | Wt 162.0 lb

## 2018-06-29 DIAGNOSIS — G43009 Migraine without aura, not intractable, without status migrainosus: Secondary | ICD-10-CM | POA: Diagnosis not present

## 2018-06-29 DIAGNOSIS — I10 Essential (primary) hypertension: Secondary | ICD-10-CM | POA: Diagnosis not present

## 2018-06-29 DIAGNOSIS — E785 Hyperlipidemia, unspecified: Secondary | ICD-10-CM | POA: Diagnosis not present

## 2018-06-29 DIAGNOSIS — Z0000 Encounter for general adult medical examination without abnormal findings: Secondary | ICD-10-CM | POA: Diagnosis not present

## 2018-06-29 NOTE — Patient Instructions (Signed)
[name] , Thank you for taking time to come for your Medicare Wellness Visit. I appreciate your ongoing commitment to your health goals. Please review the following plan we discussed and let me know if I can assist you in the future.   Screening recommendations/referrals: Colonoscopy excluded,over age 82 Recommended yearly ophthalmology/optometry visit for glaucoma screening and checkup Recommended yearly dental visit for hygiene and checkup  Vaccinations: Influenza vaccine up to date Pneumococcal vaccine up to date, completed Tdap vaccine up to date, due 02/25/2028 Shingles vaccine up to date, completed    Advanced directives: in chart  Conditions/risks identified: none  Next appointment: Dinah, NP 08/02/2018 @ 1pm  Preventive Care 82 Years and Older, Male Preventive care refers to lifestyle choices and visits with your health care provider that can promote health and wellness. What does preventive care include?  A yearly physical exam. This is also called an annual well check.  Dental exams once or twice a year.  Routine eye exams. Ask your health care provider how often you should have your eyes checked.  Personal lifestyle choices, including:  Daily care of your teeth and gums.  Regular physical activity.  Eating a healthy diet.  Avoiding tobacco and drug use.  Limiting alcohol use.  Practicing safe sex.  Taking low doses of aspirin every day.  Taking vitamin and mineral supplements as recommended by your health care provider. What happens during an annual well check? The services and screenings done by your health care provider during your annual well check will depend on your age, overall health, lifestyle risk factors, and family history of disease. Counseling  Your health care provider may ask you questions about your:  Alcohol use.  Tobacco use.  Drug use.  Emotional well-being.  Home and relationship well-being.  Sexual activity.  Eating  habits.  History of falls.  Memory and ability to understand (cognition).  Work and work Statistician. Screening  You may have the following tests or measurements:  Height, weight, and BMI.  Blood pressure.  Lipid and cholesterol levels. These may be checked every 5 years, or more frequently if you are over 24 years old.  Skin check.  Lung cancer screening. You may have this screening every year starting at age 40 if you have a 30-pack-year history of smoking and currently smoke or have quit within the past 15 years.  Fecal occult blood test (FOBT) of the stool. You may have this test every year starting at age 3.  Flexible sigmoidoscopy or colonoscopy. You may have a sigmoidoscopy every 5 years or a colonoscopy every 10 years starting at age 77.  Prostate cancer screening. Recommendations will vary depending on your family history and other risks.  Hepatitis C blood test.  Hepatitis B blood test.  Sexually transmitted disease (STD) testing.  Diabetes screening. This is done by checking your blood sugar (glucose) after you have not eaten for a while (fasting). You may have this done every 1-3 years.  Abdominal aortic aneurysm (AAA) screening. You may need this if you are a current or former smoker.  Osteoporosis. You may be screened starting at age 45 if you are at high risk. Talk with your health care provider about your test results, treatment options, and if necessary, the need for more tests. Vaccines  Your health care provider may recommend certain vaccines, such as:  Influenza vaccine. This is recommended every year.  Tetanus, diphtheria, and acellular pertussis (Tdap, Td) vaccine. You may need a Td booster every 10  years.  Zoster vaccine. You may need this after age 94.  Pneumococcal 13-valent conjugate (PCV13) vaccine. One dose is recommended after age 76.  Pneumococcal polysaccharide (PPSV23) vaccine. One dose is recommended after age 64. Talk to your health  care provider about which screenings and vaccines you need and how often you need them. This information is not intended to replace advice given to you by your health care provider. Make sure you discuss any questions you have with your health care provider. Document Released: 08/02/2015 Document Revised: 03/25/2016 Document Reviewed: 05/07/2015 Elsevier Interactive Patient Education  2017 [place] Prevention in the Home Falls can cause injuries. They can happen to people of all ages. There are many things you can do to make your home safe and to help prevent falls. What can I do on the outside of my home?  Regularly fix the edges of walkways and driveways and fix any cracks.  Remove anything that might make you trip as you walk through a door, such as a raised step or threshold.  Trim any bushes or trees on the path to your home.  Use bright outdoor lighting.  Clear any walking paths of anything that might make someone trip, such as rocks or tools.  Regularly check to see if handrails are loose or broken. Make sure that both sides of any steps have handrails.  Any raised decks and porches should have guardrails on the edges.  Have any leaves, snow, or ice cleared regularly.  Use sand or salt on walking paths during winter.  Clean up any spills in your garage right away. This includes oil or grease spills. What can I do in the bathroom?  Use night lights.  Install grab bars by the toilet and in the tub and shower. Do not use towel bars as grab bars.  Use non-skid mats or decals in the tub or shower.  If you need to sit down in the shower, use a plastic, non-slip stool.  Keep the floor dry. Clean up any water that spills on the floor as soon as it happens.  Remove soap buildup in the tub or shower regularly.  Attach bath mats securely with double-sided non-slip rug tape.  Do not have throw rugs and other things on the floor that can make you trip. What can I do  in the bedroom?  Use night lights.  Make sure that you have a light by your bed that is easy to reach.  Do not use any sheets or blankets that are too big for your bed. They should not hang down onto the floor.  Have a firm chair that has side arms. You can use this for support while you get dressed.  Do not have throw rugs and other things on the floor that can make you trip. What can I do in the kitchen?  Clean up any spills right away.  Avoid walking on wet floors.  Keep items that you use a lot in easy-to-reach places.  If you need to reach something above you, use a strong step stool that has a grab bar.  Keep electrical cords out of the way.  Do not use floor polish or wax that makes floors slippery. If you must use wax, use non-skid floor wax.  Do not have throw rugs and other things on the floor that can make you trip. What can I do with my stairs?  Do not leave any items on the stairs.  Make sure that  there are handrails on both sides of the stairs and use them. Fix handrails that are broken or loose. Make sure that handrails are as long as the stairways.  Check any carpeting to make sure that it is firmly attached to the stairs. Fix any carpet that is loose or worn.  Avoid having throw rugs at the top or bottom of the stairs. If you do have throw rugs, attach them to the floor with carpet tape.  Make sure that you have a light switch at the top of the stairs and the bottom of the stairs. If you do not have them, ask someone to add them for you. What else can I do to help prevent falls?  Wear shoes that:  Do not have high heels.  Have rubber bottoms.  Are comfortable and fit you well.  Are closed at the toe. Do not wear sandals.  If you use a stepladder:  Make sure that it is fully opened. Do not climb a closed stepladder.  Make sure that both sides of the stepladder are locked into place.  Ask someone to hold it for you, if possible.  Clearly mark  and make sure that you can see:  Any grab bars or handrails.  First and last steps.  Where the edge of each step is.  Use tools that help you move around (mobility aids) if they are needed. These include:  Canes.  Walkers.  Scooters.  Crutches.  Turn on the lights when you go into a dark area. Replace any light bulbs as soon as they burn out.  Set up your furniture so you have a clear path. Avoid moving your furniture around.  If any of your floors are uneven, fix them.  If there are any pets around you, be aware of where they are.  Review your medicines with your doctor. Some medicines can make you feel dizzy. This can increase your chance of falling. Ask your doctor what other things that you can do to help prevent falls. This information is not intended to replace advice given to you by your health care provider. Make sure you discuss any questions you have with your health care provider. Document Released: 05/02/2009 Document Revised: 12/12/2015 Document Reviewed: 08/10/2014 Elsevier Interactive Patient Education  2017 [employer].

## 2018-06-29 NOTE — Patient Instructions (Signed)
Continue aspirin 325 mg daily  and lipitor  for secondary stroke prevention  Continue to follow up with PCP regarding cholesterol and blood pressure management   Continue to stay active and maintain a healthy diet  Continue to monitor blood pressure at home  Maintain strict control of hypertension with blood pressure goal below 130/90, diabetes with hemoglobin A1c goal below 6.5% and cholesterol with LDL cholesterol (bad cholesterol) goal below 70 mg/dL. I also advised the patient to eat a healthy diet with plenty of whole grains, cereals, fruits and vegetables, exercise regularly and maintain ideal body weight.  Followup in the future with me in 6 months or call earlier if needed       Thank you for coming to see [place] at [facility]. I hope we have been able to provide you high quality care today.  You may receive a patient satisfaction survey over the next few weeks. We would appreciate your feedback and comments so that we may continue to improve ourselves and the health of our patients.

## 2018-06-29 NOTE — Progress Notes (Signed)
I agree with the above plan 

## 2018-06-29 NOTE — Progress Notes (Signed)
[facility] [address] [place]. [place] [address] [phone]       OFFICE CONSULT NOTE  [name] Date of Birth:  1927-04-08 Medical Record Number:  [id number]   Chief Complaint  Patient presents with  . Follow-up    Atypical MIgraine follow up room 9 patient alone     Referring MD:  Remi Haggard Aroor Reason for Referral:  Vision disturbance HPI: Russell [name] is a pleasant 84 year old Caucasian male seen today for initial office consultation visit for recurrent episodes of vision disturbances.History is obtained from the patient and review of electronic medical records. I personally reviewed imaging films..he states that his had recurrent stereotypical episodes of vision and speech disturbance stating back to 2006. The episode begins with a moving check her type patent in the periphery of the right eye this spreads slowly and he soon has trouble finding words and forming sentences. This may last from 20 minutes to maximum up to 1 hour.He is unable to identify any specific triggers or precipitating factors. He denies any accompanying headache before during or after these episodes. These occur on a variable frequency and he states he had a total of 8-10 such episodes in the last 13 years. His last episode occurred on 01/05/2018 when he was seen in the emergency room and admitted for workup. I personally reviewed the MRI scan which shows age-appropriate changes of small vessel disease and no acute infarct.CT angiogram of the brain and neck did not show any significant large vessel stenosis or occlusion.  Transthoracic echo was unremarkable. LDL cholesterol was 93 mg percent.ESR was not elevated. Patient states she's had no further episodes since the last admission 200 of months ago. He has no prior history of definite documented TIAs or strokes or even history of migraine headaches in the remote past. He is on aspirin 325 which is tolerating well without bruising or  bleeding. He is on Lipitor and Vasotec 10 tolerating both medications well without any side effects. The patient is anxious and scared that he may have a stroke but denies any other episode besides these recurrent stereotypical once. He did have an EEG in the hospital which was unremarkable. He has no history of definite seizures significant head injury with loss of consciousness.  Interval history 06/29/2018: Patient is being seen today for 47-monthfollow-up visit.  He did undergo repeat EEG after prior appointment which was unremarkable.  Patient states since his prior appointment, he has had one episode of visual impairment describing it as a checkerboard but denies any speech impairment.  He states that the symptoms lasted approximately 10 minutes and then subsided.  He denies any other neurological symptom at that time.  He is currently participating in balance exercise classes.  [facility] to [facility] at [facility].  Continues to take aspirin with mild bruising but no bleeding.  Continues to take atorvastatin without side effects myalgias.  Blood pressure today mildly elevated for patient at 159/82 and he does state he has this intermittently checked at facility.  No further concerns at this time.      ROS:   14 system review of systems is positive for hearing loss and all other systems negative  PMH:  Past Medical History:  Diagnosis Date  . Actinic keratosis 10/18/2012  . 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   . Atrophy, Fuchs' 12/10/2011  . 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 Cervicalgia 04/25/2013  .  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 Cyclitis, chronic 11/18/2012  . Deafness 10/18/2012  . Endothelial corneal dystrophy   . Essential hypertension 10/18/2012  . Hammer toe 04/24/2014  . Hyperlipidemia 10/18/2012  . Intermediate stage nonexudative age-related macular degeneration of both eyes  04/16/2016  . Nocturia 05/19/2016  . Occlusion and stenosis of carotid artery with cerebral infarction   . Other specified cardiac dysrhythmias(427.89)   . Pain in joint, pelvic region and thigh   . Paresthesia 04/30/2015   Toes of both feet   . Peripheral vascular disease, unspecified (HCC)   . Primary open angle glaucoma 09/25/2011  . 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  . Rosacea   . Seborrheic keratosis 04/24/2014  . Syncope 06/27/2013  . TIA (transient ischemic attack) 10/18/2012   States he has had about 5  times  . Urine frequency     Social History:  Social History   Socioeconomic History  . Marital status: Widowed    Spouse name: Not on file  . Number of children: Not on file  . Years of education: Not on file  . Highest education level: Not on file  Occupational History  . Occupation: retired Engineer soft ware  Social Needs  . Financial resource strain: Not hard at all  . Food insecurity:    Worry: Never true    Inability: Never true  . Transportation needs:    Medical: No    Non-medical: No  Tobacco Use  . Smoking status: Former Smoker    Packs/day: 1.00    Years: 25.00    Pack years: 25.00    Last attempt to quit: 10/18/1964    Years since quitting: 53.7  . Smokeless tobacco: Never Used  . Tobacco comment: quit 1966  Substance and Sexual Activity  . Alcohol use: No    Alcohol/week: 1.0 standard drinks    Types: 1 Glasses of wine per week    Frequency: Never  . Drug use: No  . Sexual activity: Not on file  Lifestyle  . Physical activity:    Days per week: 5 days    Minutes per session: 10 min  . Stress: Not at all  Relationships  . Social connections:    Talks on phone: More than three times a week    Gets together: More than three times a week    Attends religious service: 1 to 4 times per year    Active member of club or organization: Yes    Attends meetings of clubs or organizations: 1 to 4 times per year    Relationship status: Widowed  . Intimate partner  violence:    Fear of current or ex partner: No    Emotionally abused: No    Physically abused: No    Forced sexual activity: No  Other Topics Concern  . Not on file  Social History Narrative   Lives at [facility] since 08/21/2011   Married to Betty   Has Living Will   Exercise: 3 x week bicycle   Previously smoked stopped 1966   Does not drink cafferinated beverage   Drinks one small glass of wine few nights a week     Medications:   Current Outpatient Medications on File Prior to Visit  Medication Sig Dispense Refill  . aspirin 325 MG tablet Take 325 mg by mouth every evening.    . atorvastatin (LIPITOR) 20 MG tablet Take 1 tablet (20 mg total) by mouth daily. 90 tablet 1  . dorzolamide (TRUSOPT) 2 %   ophthalmic solution Place 1 drop into both eyes 2 (two) times daily.    . enalapril (VASOTEC) 20 MG tablet TAKE 1 TABLET TWICE A DAY TO CONTROL BLOOD PRESSURE 180 tablet 1  . loratadine (CLARITIN) 10 MG tablet Take 10 mg by mouth daily.    . Multiple Vitamins-Minerals (PRESERVISION AREDS 2) CAPS Take 1 capsule by mouth 2 (two) times daily.    . Omega-3 Fatty Acids (FISH OIL) 1000 MG CAPS Take 1,000 mg by mouth 2 (two) times daily.     . timolol (BETIMOL) 0.5 % ophthalmic solution Place 1 drop into both eyes 2 (two) times daily.    . Travoprost, BAK Free, (TRAVATAN Z) 0.004 % SOLN ophthalmic solution Place 1 drop into the left eye at bedtime. One drop left eye once a day for glaucoma      No current facility-administered medications on file prior to visit.     Allergies:  No Known Allergies  Physical Exam General: well developed, well nourished pleasant elderly Caucasian male, seated, in no evident distress Head: head normocephalic and atraumatic.   Neck: supple with no carotid or supraclavicular bruits Cardiovascular: regular rate and rhythm, no murmurs Musculoskeletal: no deformity Skin:  no rash/petichiae. Vascular:  Normal pulses all extremities  Neurologic  Exam Mental Status: Awake and fully alert. Oriented to place and time. Recent and remote memory intact. Attention span, concentration and fund of knowledge appropriate. Mood and affect appropriate.  Cranial Nerves: Fundoscopic exam deferred. Pupils unequal. Extraocular movements full without nystagmus. Visual fields full to confrontation. Hearing significantly reduced bilaterally. Facial sensation intact. Face, tongue, palate moves normally and symmetrically.  Motor: Normal bulk and tone. Normal strength in all tested extremity muscles. Sensory.: intact to touch , pinprick , position and vibratory sensation.  Coordination: Rapid alternating movements normal in all extremities. Finger-to-nose and heel-to-shin performed accurately bilaterally. Gait and Station: Arises from chair without difficulty. Stance is normal. Gait demonstrates normal stride length and balance . Unable to heel, toe and tandem walk without difficulty.  Reflexes: 1+ and symmetric. Toes downgoing.      ASSESSMENT: 89-year-old pleasant Caucasian male with recurrent stereotypical episodes of vision disturbance followed by speech language difficulties likely atypical or complicated migraine episodes. Simple partial seizures or TIA is less likely.  Patient is being seen today for follow-up visit and has had one episode of visual disturbance since prior visit but denies any additional episodes.    PLAN: -Continue aspirin 325 mg and atorvastatin 20 mg daily for secondary stroke prevention -Advised patient to continue to monitor and write down when he has episodes of visual impairment and/or speech impairment.  Reviewed with patient that these are most likely related to complicated migraines and as they happen infrequently, no need to initiate prophylactic treatment at this time -Highly encouraged patient that if he experiences worsening of his normal symptoms or different neurological symptoms, to be seen in the ED for further  evaluation -Advised patient to continue to stay active and maintain a healthy diet  Follow-up in 6 months or call earlier with questions, concerns or need of sooner follow-up appointment  Greater than 50% time during this 25 minute consultation visit was spent on counseling and coordination of care about his episodes of visual and speech disturbance, discussion about complicated migraine and answering questions  Jessica VanSchaick, AGNP-BC  [facility] [address] [place], [place] [address]  Phone [phone] Fax [phone] Note: This document was prepared with digital dictation and possible smart phrase technology.   Any transcriptional errors that result from this process are unintentional.   

## 2018-06-29 NOTE — Progress Notes (Signed)
Subjective:   Arend Bahl is a 84 y.o. male who presents for Medicare Annual/Subsequent preventive examination     Objective:    Vitals: BP (!) 142/68 (BP Location: Left Arm, Patient Position: Sitting)   Pulse 74   Temp 97.8 F (36.6 C) (Oral)   Ht 5\' 9"  (1.753 m)   Wt 162 lb (73.5 kg)   SpO2 95%   BMI 23.92 kg/m   Body mass index is 23.92 kg/m.  Advanced Directives 06/29/2018 05/03/2018 01/10/2018 01/06/2018 01/06/2018 12/07/2017 11/01/2017  Does Patient Have a Medical Advance Directive? Yes Yes Yes No No Yes Yes  Type of Industrial/product designer of [place];Living will;Out of facility DNR (pink MOST or yellow form) - - Press photographer;Out of facility DNR (pink MOST or yellow form) [place];Living will  Does patient want to make changes to medical advance directive? No - Patient declined No - Patient declined No - Patient declined - - - Yes (MAU/Ambulatory/Procedural Areas - Information given)  Copy of [place] in Chart? Yes - validated most recent copy scanned in chart (See row information) Yes Yes - - Yes Yes  Would patient like information on creating a medical advance directive? - - - - No - Patient declined - -  Pre-existing out of facility DNR order (yellow form or pink MOST form) - Pink MOST form placed in chart (order not valid for inpatient use);Yellow form placed in chart (order not valid for inpatient use) Pink MOST form placed in chart (order not valid for inpatient use);Yellow form placed in chart (order not valid for inpatient use) - - Yellow form placed in chart (order not valid for inpatient use);Pink MOST form placed in chart (order not valid for inpatient use) -    Tobacco Social History   Tobacco Use  Smoking Status Former Smoker  . Packs/day: 1.00  . Years: 25.00  . Pack years: 25.00  . Last attempt to quit: 10/18/1964  . Years since quitting:  53.7  Smokeless Tobacco Never Used  Tobacco Comment   quit 1966     Counseling given: Not Answered Comment: quit 1966   Clinical Intake:  Pre-visit preparation completed: No  Pain : No/denies pain     Diabetes: No  How often do you need to have someone help you when you read instructions, pamphlets, or other written materials from your doctor or pharmacy?: 1 - Never What is the last grade level you completed in school?: masters  Interpreter Needed?: No  Information entered by :: Tyson Dense, RN  Past Medical History:  Diagnosis Date  . Actinic keratosis 10/18/2012  . 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   . Atrophy, Fuchs' 12/10/2011  . 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 Cervicalgia 04/25/2013  . 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 Cyclitis, chronic 11/18/2012  . Deafness 10/18/2012  . Endothelial corneal dystrophy   . Essential hypertension 10/18/2012  . Hammer toe 04/24/2014  . Hyperlipidemia 10/18/2012  . Intermediate stage nonexudative age-related macular degeneration of both eyes 04/16/2016  . Nocturia 05/19/2016  . Occlusion and stenosis of carotid artery with cerebral infarction   . Other specified cardiac dysrhythmias(427.89)   . Pain in joint, pelvic region and thigh   . Paresthesia 04/30/2015   Toes of both feet   . Peripheral vascular disease, unspecified ([place])   . Primary open angle glaucoma  09/25/2011  . 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  . Rosacea   . Seborrheic keratosis 04/24/2014  . Syncope 06/27/2013  . TIA (transient ischemic attack) 10/18/2012   States he has had about 5  times  . Urine frequency    Past Surgical History:  Procedure Laterality Date  . CATARACT EXTRACTION  2005   bilateral  . CORNEAL TRANSPLANT  2010 left; 2013 right eye  . HERNIA REPAIR  1985   Left  . HERNIA REPAIR  03/2004   Right  . SHOULDER DEBRIDEMENT Left 2005  . TONSILLECTOMY Bilateral childhood  . TRANSURETHRAL  RESECTION OF PROSTATE N/A 10/2003  . VASECTOMY     Family History  Problem Relation Age of Onset  . Cancer Mother   . Heart disease Father    Social History   Socioeconomic History  . Marital status: Widowed    Spouse name: Not on file  . Number of children: Not on file  . Years of education: Not on file  . Highest education level: Not on file  Occupational History  . Occupation: retired Insurance claims handler  Social Needs  . Financial resource strain: Not hard at all  . Food insecurity:    Worry: Never true    Inability: Never true  . Transportation needs:    Medical: No    Non-medical: No  Tobacco Use  . Smoking status: Former Smoker    Packs/day: 1.00    Years: 25.00    Pack years: 25.00    Last attempt to quit: 10/18/1964    Years since quitting: 53.7  . Smokeless tobacco: Never Used  . Tobacco comment: quit 1966  Substance and Sexual Activity  . Alcohol use: No    Alcohol/week: 1.0 standard drinks    Types: 1 Glasses of wine per week    Frequency: Never    Comment: very seldom  . Drug use: No  . Sexual activity: Not on file  Lifestyle  . Physical activity:    Days per week: 5 days    Minutes per session: 10 min  . Stress: Only a little  Relationships  . Social connections:    Talks on phone: More than three times a week    Gets together: More than three times a week    Attends religious service: 1 to 4 times per year    Active member of club or organization: Yes    Attends meetings of clubs or organizations: 1 to 4 times per year    Relationship status: Widowed  Other Topics Concern  . Not on file  Social History Narrative   L[facility] at [facility] since 08/21/2011   Married to [place] Will   Exercise: 3 x week bicycle   Previously smoked stopped 1966   Does not drink cafferinated beverage   Drinks one small glass of wine few nights a week     Outpatient Encounter Medications as of 06/29/2018  Medication Sig  . aspirin 325 MG tablet  Take 325 mg by mouth every evening.  Marland Kitchen atorvastatin (LIPITOR) 20 MG tablet Take 1 tablet (20 mg total) by mouth daily.  . DORZOLAMIDE HCL-TIMOLOL MAL OP Apply to eye.  . enalapril (VASOTEC) 20 MG tablet TAKE 1 TABLET TWICE A DAY TO CONTROL BLOOD PRESSURE  . loratadine (CLARITIN) 10 MG tablet Take 10 mg by mouth daily.  . Multiple Vitamins-Minerals (PRESERVISION AREDS 2) CAPS Take 1 capsule by mouth 2 (two) times daily.  . Omega-3 Fatty Acids (FISH  OIL) 1000 MG CAPS Take 1,000 mg by mouth 2 (two) times daily.   . Travoprost, BAK Free, (TRAVATAN Z) 0.004 % SOLN ophthalmic solution Place 1 drop into the left eye at bedtime. One drop left eye once a day for glaucoma    No facility-administered encounter medications on file as of 06/29/2018.     Activities of Daily Living In your present state of health, do you have any difficulty performing the following activities: 06/29/2018 01/06/2018  Hearing? N -  Vision? N -  Difficulty concentrating or making decisions? N -  Walking or climbing stairs? N -  Dressing or bathing? N -  Doing errands, shopping? N N  Preparing Food and eating ? N -  Using the Toilet? N -  In the past six months, have you accidently leaked urine? N -  Do you have problems with loss of bowel control? N -  Managing your Medications? N -  Managing your Finances? N -  Housekeeping or managing your Housekeeping? N -  Some recent data might be hidden    Patient Care Team: Ngetich, Nelda Bucks, NP as PCP - General (Family Medicine) [place], [facility] Crista Luria, MD as Consulting Physician (Dermatology) Marilynne Halsted, MD as Referring Physician (Ophthalmology) Bond, Tracie Harrier, MD as Referring Physician (Ophthalmology)   Assessment:   This is a routine wellness examination for Eliza.  Exercise Activities and Dietary recommendations Current Exercise Habits: Home exercise routine, Type of exercise: treadmill;strength training/weights, Time (Minutes): 30, Frequency  (Times/Week): 5, Weekly Exercise (Minutes/Week): 150, Intensity: Mild, Exercise limited by: None identified  Goals    . Patient Stated     Patient will work on Education officer, community.       Fall Risk Fall Risk  06/29/2018 06/29/2018 05/03/2018 03/14/2018 06/25/2017  Falls in the past year? 0 0 No No No  Number falls in past yr: 0 - - - -  Injury with Fall? 0 - - - -   Is the patient's home free of loose throw rugs in walkways, pet beds, electrical cords, etc?   yes      Grab bars in the bathroom? yes      Handrails on the stairs?   yes      Adequate lighting?   yes  Depression Screen PHQ 2/9 Scores 06/29/2018 07/05/2017 06/25/2017 05/03/2017  PHQ - 2 Score 0 0 0 0  PHQ- 9 Score - - - 0    Cognitive Function completed within the last year MMSE - Mini Mental State Exam 05/03/2018 05/03/2017 05/19/2016 10/30/2014 10/18/2012  Not completed: - (No Data) - - -  Orientation to time 5 5 5 5 5   Orientation to Place 5 5 5 5 5   Registration 3 3 3 3 3   Attention/ Calculation 4 5 5 5 5   Recall 3 3 3 3 3   Language- name 2 objects 2 2 2 2 2   Language- repeat 1 1 1 1 1   Language- follow 3 step command 3 3 3 3 3   Language- read & follow direction 1 1 1 1 1   Write a sentence 1 1 1 1 1   Copy design 0 1 1 1 1   Total score 28 30 30 30 30         Immunization History  Administered Date(s) Administered  . Influenza Whole 04/19/2012, 04/20/2013  . Influenza,inj,Quad PF,6+ Mos 04/21/2018  . Influenza-Unspecified 05/03/2014, 04/18/2015, 04/30/2016, 04/18/2017  . Pneumococcal Conjugate-13 03/20/2010  . Pneumococcal Polysaccharide-23 11/02/2017  . Td 02/24/2018  .  Tdap 06/27/2011  . Zoster Recombinat (Shingrix) 12/24/2017, 03/09/2018    Qualifies for Shingles Vaccine? Up to date, completed  Screening Tests Health Maintenance  Topic Date Due  . TETANUS/TDAP  02/25/2028  . INFLUENZA VACCINE  Completed  . PNA vac Low Risk Adult  Completed   Cancer Screenings: Lung: Low Dose CT Chest recommended  if Age 46-80 years, 30 pack-year currently smoking OR have quit w/in 15years. Patient does not qualify. Colorectal: up to date  Additional Screenings:  Hepatitis C Screening:declined      Plan:    I have personally reviewed and addressed the Medicare Annual Wellness questionnaire and have noted the following in the patient's chart:  A. Medical and social history B. Use of alcohol, tobacco or illicit drugs  C. Current medications and supplements D. Functional ability and status E.  Nutritional status F.  Physical activity G. Advance directives H. List of other physicians I.  Hospitalizations, surgeries, and ER visits in previous 12 months J.  [place] to include hearing, vision, cognitive, depression L. Referrals and appointments - none  In addition, I have reviewed and discussed with patient certain preventive protocols, quality metrics, and best practice recommendations. A written personalized care plan for preventive services as well as general preventive health recommendations were provided to patient.  See attached scanned questionnaire for additional information.   Signed,   Tyson Dense, RN Nurse Health Advisor  Patient Concerns: None

## 2018-06-30 DIAGNOSIS — M25571 Pain in right ankle and joints of right foot: Secondary | ICD-10-CM | POA: Diagnosis not present

## 2018-06-30 DIAGNOSIS — M6281 Muscle weakness (generalized): Secondary | ICD-10-CM | POA: Diagnosis not present

## 2018-06-30 DIAGNOSIS — R2681 Unsteadiness on feet: Secondary | ICD-10-CM | POA: Diagnosis not present

## 2018-06-30 DIAGNOSIS — R2689 Other abnormalities of gait and mobility: Secondary | ICD-10-CM | POA: Diagnosis not present

## 2018-07-07 DIAGNOSIS — H353132 Nonexudative age-related macular degeneration, bilateral, intermediate dry stage: Secondary | ICD-10-CM | POA: Diagnosis not present

## 2018-07-07 DIAGNOSIS — H21262 Iris atrophy (essential) (progressive), left eye: Secondary | ICD-10-CM | POA: Diagnosis not present

## 2018-07-07 DIAGNOSIS — Z961 Presence of intraocular lens: Secondary | ICD-10-CM | POA: Diagnosis not present

## 2018-07-07 DIAGNOSIS — H401131 Primary open-angle glaucoma, bilateral, mild stage: Secondary | ICD-10-CM | POA: Diagnosis not present

## 2018-07-07 DIAGNOSIS — Z947 Corneal transplant status: Secondary | ICD-10-CM | POA: Diagnosis not present

## 2018-07-19 DIAGNOSIS — Z8673 Personal history of transient ischemic attack (TIA), and cerebral infarction without residual deficits: Secondary | ICD-10-CM | POA: Diagnosis not present

## 2018-07-19 DIAGNOSIS — E785 Hyperlipidemia, unspecified: Secondary | ICD-10-CM | POA: Diagnosis not present

## 2018-07-19 DIAGNOSIS — E7800 Pure hypercholesterolemia, unspecified: Secondary | ICD-10-CM | POA: Diagnosis not present

## 2018-07-19 DIAGNOSIS — I10 Essential (primary) hypertension: Secondary | ICD-10-CM | POA: Diagnosis not present

## 2018-07-21 ENCOUNTER — Other Ambulatory Visit: Payer: Medicare Other

## 2018-07-21 DIAGNOSIS — Z8673 Personal history of transient ischemic attack (TIA), and cerebral infarction without residual deficits: Secondary | ICD-10-CM

## 2018-07-21 DIAGNOSIS — I10 Essential (primary) hypertension: Secondary | ICD-10-CM

## 2018-07-21 DIAGNOSIS — E785 Hyperlipidemia, unspecified: Secondary | ICD-10-CM

## 2018-07-21 DIAGNOSIS — E7800 Pure hypercholesterolemia, unspecified: Secondary | ICD-10-CM

## 2018-07-21 LAB — COMPLETE METABOLIC PANEL WITH GFR
AG Ratio: 2.2 (calc) (ref 1.0–2.5)
ALBUMIN MSPROF: 4.1 g/dL (ref 3.6–5.1)
ALT: 25 U/L (ref 9–46)
AST: 25 U/L (ref 10–35)
Alkaline phosphatase (APISO): 48 U/L (ref 40–115)
BILIRUBIN TOTAL: 0.9 mg/dL (ref 0.2–1.2)
BUN: 21 mg/dL (ref 7–25)
CHLORIDE: 105 mmol/L (ref 98–110)
CO2: 28 mmol/L (ref 20–32)
Calcium: 8.7 mg/dL (ref 8.6–10.3)
Creat: 0.84 mg/dL (ref 0.70–1.11)
GFR, EST AFRICAN AMERICAN: 90 mL/min/{1.73_m2} (ref >=60)
GFR, Est Non African American: 78 mL/min/{1.73_m2} (ref >=60)
GLOBULIN: 1.9 g/dL (ref 1.9–3.7)
Glucose, Bld: 82 mg/dL (ref 65–99)
Potassium: 4.2 mmol/L (ref 3.5–5.3)
SODIUM: 139 mmol/L (ref 135–146)
Total Protein: 6 g/dL — ABNORMAL LOW (ref 6.1–8.1)

## 2018-07-21 LAB — LIPID PANEL
CHOL/HDL RATIO: 3.4 (calc) (ref <5.0)
Cholesterol: 149 mg/dL (ref <200)
HDL: 44 mg/dL (ref >40)
LDL Cholesterol (Calc): 90 mg/dL (calc)
Non-HDL Cholesterol (Calc): 105 mg/dL (calc) (ref <130)
Triglycerides: 68 mg/dL (ref <150)

## 2018-07-21 LAB — CBC WITH DIFFERENTIAL/PLATELET
ABSOLUTE MONOCYTES: 827 {cells}/uL (ref 200–950)
BASOS PCT: 0.8 %
Basophils Absolute: 62 cells/uL (ref 0–200)
EOS ABS: 335 {cells}/uL (ref 15–500)
Eosinophils Relative: 4.3 %
HCT: 43.7 % (ref 38.5–50.0)
HEMOGLOBIN: 15.1 g/dL (ref 13.2–17.1)
Lymphs Abs: 1677 cells/uL (ref 850–3900)
MCH: 30.3 pg (ref 27.0–33.0)
MCHC: 34.6 g/dL (ref 32.0–36.0)
MCV: 87.8 fL (ref 80.0–100.0)
MONOS PCT: 10.6 %
MPV: 10 fL (ref 7.5–12.5)
NEUTROS ABS: 4898 {cells}/uL (ref 1500–7800)
Neutrophils Relative %: 62.8 %
PLATELETS: 175 10*3/uL (ref 140–400)
RBC: 4.98 10*6/uL (ref 4.20–5.80)
RDW: 13 % (ref 11.0–15.0)
Total Lymphocyte: 21.5 %
WBC: 7.8 10*3/uL (ref 3.8–10.8)

## 2018-07-27 ENCOUNTER — Non-Acute Institutional Stay: Payer: Medicare Other | Admitting: Internal Medicine

## 2018-07-27 ENCOUNTER — Encounter: Payer: Self-pay | Admitting: Internal Medicine

## 2018-07-27 VITALS — BP 160/90 | HR 67 | Temp 97.4°F | Ht 69.0 in | Wt 163.6 lb

## 2018-07-27 DIAGNOSIS — I10 Essential (primary) hypertension: Secondary | ICD-10-CM | POA: Diagnosis not present

## 2018-07-27 DIAGNOSIS — E785 Hyperlipidemia, unspecified: Secondary | ICD-10-CM | POA: Diagnosis not present

## 2018-07-27 DIAGNOSIS — G459 Transient cerebral ischemic attack, unspecified: Secondary | ICD-10-CM | POA: Diagnosis not present

## 2018-07-28 ENCOUNTER — Telehealth: Payer: Self-pay | Admitting: *Deleted

## 2018-07-28 ENCOUNTER — Other Ambulatory Visit: Payer: Self-pay

## 2018-07-28 MED ORDER — ATORVASTATIN CALCIUM 40 MG PO TABS: 40.0000 mg | ORAL_TABLET | Freq: Every day | ORAL | 3 refills | Status: DC

## 2018-07-28 NOTE — Progress Notes (Signed)
Location:  [place] of Service:  Clinic (12)  Provider:   Code Status:  Goals of Care:  Advanced Directives 07/27/2018  Does Patient Have a Medical Advance Directive? Yes  Type of Paramedic of [place];Out of facility DNR (pink MOST or yellow form)  Does patient want to make changes to medical advance directive? No - Patient declined  Copy of [place] in Chart? Yes - validated most recent copy scanned in chart (See row information)  Would patient like information on creating a medical advance directive? -  Pre-existing out of facility DNR order (yellow form or pink MOST form) Yellow form placed in chart (order not valid for inpatient use)     Chief Complaint  Patient presents with  . Medical Management of Chronic Issues    3 month follow up,discuss bruising    HPI: Patient is a 85 y.o. male seen today for medical management of chronic diseases.   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 Patient did not have any acute complains. He did have some bruises on his hand .  His Wife passed away a year ago. He says he is managing fine now. He follows with Neurology for his atypical symptoms of Visual Impairment. He has had detail work ups including EEG, Echo and MRI .which have been Negative. They have recommended Asprin, BP control and Statin.  Patient follows with Podiatry for his Hammer toe.  He is independent and walks with no assist device.  Past Medical History:  Diagnosis Date  . Actinic keratosis 10/18/2012  . 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   . Atrophy, Fuchs' 12/10/2011  . 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 Cervicalgia 04/25/2013  . 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 Cyclitis, chronic 11/18/2012  . Deafness 10/18/2012  . Endothelial corneal dystrophy   . Essential hypertension 10/18/2012  . Hammer toe  04/24/2014  . Hyperlipidemia 10/18/2012  . Intermediate stage nonexudative age-related macular degeneration of both eyes 04/16/2016  . Nocturia 05/19/2016  . Occlusion and stenosis of carotid artery with cerebral infarction   . Other specified cardiac dysrhythmias(427.89)   . Pain in joint, pelvic region and thigh   . Paresthesia 04/30/2015   Toes of both feet   . Peripheral vascular disease, unspecified ([place])   . Primary open angle glaucoma 09/25/2011  . 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  . Rosacea   . Seborrheic keratosis 04/24/2014  . Syncope 06/27/2013  . TIA (transient ischemic attack) 10/18/2012   States he has had about 5  times  . Urine frequency     Past Surgical History:  Procedure Laterality Date  . CATARACT EXTRACTION  2005   bilateral  . CORNEAL TRANSPLANT  2010 left; 2013 right eye  . HERNIA REPAIR  1985   Left  . HERNIA REPAIR  03/2004   Right  . SHOULDER DEBRIDEMENT Left 2005  . TONSILLECTOMY Bilateral childhood  . TRANSURETHRAL RESECTION OF PROSTATE N/A 10/2003  . VASECTOMY      No Known Allergies  Outpatient Encounter Medications as of 07/27/2018  Medication Sig  . aspirin 325 MG tablet Take 325 mg by mouth every evening.  . DORZOLAMIDE HCL-TIMOLOL MAL OP Apply to eye.  . enalapril (VASOTEC) 20 MG tablet TAKE 1 TABLET TWICE A DAY TO CONTROL BLOOD PRESSURE  . loratadine (CLARITIN) 10 MG tablet Take 10 mg by mouth daily.  . Multiple  Vitamins-Minerals (PRESERVISION AREDS 2) CAPS Take 1 capsule by mouth 2 (two) times daily.  . Omega-3 Fatty Acids (FISH OIL) 1000 MG CAPS Take 1,000 mg by mouth 2 (two) times daily.   . Travoprost, BAK Free, (TRAVATAN Z) 0.004 % SOLN ophthalmic solution Place 1 drop into the left eye at bedtime. One drop left eye once a day for glaucoma   . [DISCONTINUED] atorvastatin (LIPITOR) 20 MG tablet Take 1 tablet (20 mg total) by mouth daily.  Marland Kitchen atorvastatin (LIPITOR) 40 MG tablet Take 1 tablet (40 mg total) by mouth daily.   No facility-administered  encounter medications on file as of 07/27/2018.     Review of Systems:  Review of Systems  Review of Systems  Constitutional: Negative for activity change, appetite change, chills, diaphoresis, fatigue and fever.  HENT: Negative for mouth sores, postnasal drip, rhinorrhea, sinus pain and sore throat.   Respiratory: Negative for apnea, cough, chest tightness, shortness of breath and wheezing.   Cardiovascular: Negative for chest pain, palpitations and leg swelling.  Gastrointestinal: Negative for abdominal distention, abdominal pain, constipation, diarrhea, nausea and vomiting.  Genitourinary: Negative for dysuria and frequency.  Musculoskeletal: Negative for arthralgias, joint swelling and myalgias.  Skin: Negative for rash.  Neurological: Negative for dizziness, syncope, weakness, light-headedness and numbness.  Psychiatric/Behavioral: Negative for behavioral problems, confusion and sleep disturbance.     Health Maintenance  Topic Date Due  . TETANUS/TDAP  02/25/2028  . INFLUENZA VACCINE  Completed  . PNA vac Low Risk Adult  Completed    Physical Exam: Vitals:   07/27/18 1525  BP: (!) 160/90  Pulse: 67  Temp: (!) 97.4 F (36.3 C)  TempSrc: Oral  SpO2: 97%  Weight: 163 lb 9.6 oz (74.2 kg)  Height: 5\' 9"  (1.753 m)   Body mass index is 24.16 kg/m. Physical Exam Vitals signs reviewed.  Constitutional:      Appearance: Normal appearance.  HENT:     Head: Normocephalic.     Nose: Nose normal.     Mouth/Throat:     Mouth: Mucous membranes are moist.     Pharynx: Oropharynx is clear.  Eyes:     Pupils: Pupils are equal, round, and reactive to light.  Neck:     Musculoskeletal: Neck supple.  Cardiovascular:     Rate and Rhythm: Normal rate and regular rhythm.     Pulses: Normal pulses.     Heart sounds: Normal heart sounds.  Pulmonary:     Effort: Pulmonary effort is normal. No respiratory distress.     Breath sounds: Normal breath sounds. No wheezing.  Abdominal:       General: Abdomen is flat. Bowel sounds are normal.     Palpations: Abdomen is soft.  Musculoskeletal:        General: No swelling.  Skin:    General: Skin is warm and dry.  Neurological:     General: No focal deficit present.     Mental Status: He is alert and oriented to person, place, and time.  Psychiatric:        Mood and Affect: Mood normal.        Behavior: Behavior normal.        Thought Content: Thought content normal.        Judgment: Judgment normal.     Labs reviewed: Basic Metabolic Panel: Recent Labs    01/05/18 1201 01/05/18 1234 04/22/18 0000 07/19/18 0000  NA 136 139 139 139  K 3.7 3.9 4.2 4.2  CL  104 101 104 105  CO2 25  --  28 28  GLUCOSE 89 83 81 82  BUN 14 15 22 21   CREATININE 0.79 0.80 0.88 0.84  CALCIUM 8.7*  --  8.6 8.7   Liver Function Tests: Recent Labs    01/05/18 1201 04/22/18 0000 07/19/18 0000  AST 28 21 25   ALT 24 18 25   ALKPHOS 48  --   --   BILITOT 1.1 0.9 0.9  PROT 5.9* 5.6* 6.0*  ALBUMIN 3.7  --   --    No results for input(s): LIPASE, AMYLASE in the last 8760 hours. No results for input(s): AMMONIA in the last 8760 hours. CBC: Recent Labs    08/23/17 0000 01/05/18 1201 01/05/18 1234 04/22/18 0000 07/19/18 0000  WBC 7.0 7.6  --  6.5 7.8  NEUTROABS 4,424 4.5  --   --  4,898  HGB 15.2 15.1 13.9 14.4 15.1  HCT 43.2 44.8 41.0 42.3 43.7  MCV 86.4 88.7  --  87.6 87.8  PLT 176 152  --  184 175   Lipid Panel: Recent Labs    10/25/17 0755 04/22/18 0000 07/19/18 0000  CHOL 154 114 149  HDL 43 38* 44  LDLCALC 93 61 90  TRIG 87 73 68  CHOLHDL 3.6 3.0 3.4   No results found for: HGBA1C  Procedures since last visit: No results found.  Assessment/Plan  Hypertension Mildly elevated today Patient said he will check it at home and maintain the log On Enalapril ? TIA/ Atypical Migraine Follows with neurology Continue on Aspirin and Statin Hyperlipidemia His LDL has increased  since his dose of statin was  reduced Since patient ws not having any issues with higher dose will increase his Lipitor to 40 mg agin Mild Bruises on his hand Most likely due to age and aspirin No Changes made right npow  Labs/tests ordered:  @ORDERS @ Next appt:in 4 months

## 2018-07-28 NOTE — Telephone Encounter (Signed)
Patient called and stated that he saw Dr. Lyndel Safe yesterday at [facility]. Stated that she told him that she was going to INCREASE his Atorvastatin from 20mg  to 40mg . Patient went to his pharmacy this morning to pick up the Rx but it was never sent in. Patient stated that he did not want to make another unnecessary trip to the pharmacy.  Please Advise.    OV note not completed.

## 2018-07-29 NOTE — Telephone Encounter (Signed)
Call placed to patient he is aware his medication has been called in.

## 2018-07-29 NOTE — Telephone Encounter (Signed)
I just approved his Lipitor 40 mg.

## 2018-07-31 ENCOUNTER — Encounter: Payer: Self-pay | Admitting: Internal Medicine

## 2018-08-02 ENCOUNTER — Encounter: Payer: Self-pay | Admitting: Family

## 2018-08-05 DIAGNOSIS — M79671 Pain in right foot: Secondary | ICD-10-CM | POA: Diagnosis not present

## 2018-08-05 DIAGNOSIS — B351 Tinea unguium: Secondary | ICD-10-CM | POA: Diagnosis not present

## 2018-08-05 DIAGNOSIS — M79672 Pain in left foot: Secondary | ICD-10-CM | POA: Diagnosis not present

## 2018-08-23 ENCOUNTER — Other Ambulatory Visit: Payer: Self-pay | Admitting: *Deleted

## 2018-08-23 MED ORDER — ENALAPRIL MALEATE 20 MG PO TABS: ORAL_TABLET | ORAL | 1 refills | Status: DC

## 2018-08-23 NOTE — Telephone Encounter (Signed)
[employer] [facility]

## 2018-10-09 ENCOUNTER — Other Ambulatory Visit: Payer: Self-pay | Admitting: Family

## 2018-10-10 ENCOUNTER — Other Ambulatory Visit: Payer: Self-pay

## 2018-10-10 NOTE — Telephone Encounter (Signed)
Opened in error; Disregard.

## 2018-10-24 ENCOUNTER — Telehealth: Payer: Self-pay | Admitting: *Deleted

## 2018-10-24 MED ORDER — ATORVASTATIN CALCIUM 40 MG PO TABS: 40.0000 mg | ORAL_TABLET | Freq: Every day | ORAL | 3 refills | Status: DC

## 2018-10-24 NOTE — Telephone Encounter (Signed)
Patient requested Atorvastatin to be sent to [facility].

## 2018-10-24 NOTE — Telephone Encounter (Signed)
Patient called and left message on Clinical intake and stated that he needs some Rx's sent to [facility].  Tried calling patient back to see what medications he was needing. LMOM to return call.

## 2018-11-23 DIAGNOSIS — E785 Hyperlipidemia, unspecified: Secondary | ICD-10-CM | POA: Diagnosis not present

## 2018-11-23 DIAGNOSIS — I10 Essential (primary) hypertension: Secondary | ICD-10-CM | POA: Diagnosis not present

## 2018-11-24 ENCOUNTER — Other Ambulatory Visit: Payer: Medicare Other

## 2018-11-24 ENCOUNTER — Other Ambulatory Visit: Payer: Self-pay

## 2018-11-24 DIAGNOSIS — I10 Essential (primary) hypertension: Secondary | ICD-10-CM

## 2018-11-24 DIAGNOSIS — E785 Hyperlipidemia, unspecified: Secondary | ICD-10-CM

## 2018-11-24 LAB — COMPREHENSIVE METABOLIC PANEL
AG Ratio: 2 (calc) (ref 1.0–2.5)
ALT: 18 U/L (ref 9–46)
AST: 22 U/L (ref 10–35)
Albumin: 4 g/dL (ref 3.6–5.1)
Alkaline phosphatase (APISO): 47 U/L (ref 35–144)
BUN: 20 mg/dL (ref 7–25)
CO2: 29 mmol/L (ref 20–32)
Calcium: 8.6 mg/dL (ref 8.6–10.3)
Chloride: 107 mmol/L (ref 98–110)
Creat: 0.85 mg/dL (ref 0.70–1.11)
Globulin: 2 g/dL (calc) (ref 1.9–3.7)
Glucose, Bld: 85 mg/dL (ref 65–99)
Potassium: 4.3 mmol/L (ref 3.5–5.3)
Sodium: 141 mmol/L (ref 135–146)
Total Bilirubin: 1 mg/dL (ref 0.2–1.2)
Total Protein: 6 g/dL — ABNORMAL LOW (ref 6.1–8.1)

## 2018-11-24 LAB — CBC WITH DIFFERENTIAL/PLATELET
Absolute Monocytes: 803 cells/uL (ref 200–950)
Basophils Absolute: 70 cells/uL (ref 0–200)
Basophils Relative: 0.9 %
Eosinophils Absolute: 312 cells/uL (ref 15–500)
Eosinophils Relative: 4 %
HCT: 43.4 % (ref 38.5–50.0)
Hemoglobin: 15.2 g/dL (ref 13.2–17.1)
Lymphs Abs: 1599 cells/uL (ref 850–3900)
MCH: 30.3 pg (ref 27.0–33.0)
MCHC: 35 g/dL (ref 32.0–36.0)
MCV: 86.6 fL (ref 80.0–100.0)
MPV: 10.4 fL (ref 7.5–12.5)
Monocytes Relative: 10.3 %
Neutro Abs: 5015 cells/uL (ref 1500–7800)
Neutrophils Relative %: 64.3 %
Platelets: 188 10*3/uL (ref 140–400)
RBC: 5.01 10*6/uL (ref 4.20–5.80)
RDW: 13.1 % (ref 11.0–15.0)
Total Lymphocyte: 20.5 %
WBC: 7.8 10*3/uL (ref 3.8–10.8)

## 2018-11-24 LAB — LIPID PANEL
Cholesterol: 122 mg/dL (ref <200)
HDL: 36 mg/dL — ABNORMAL LOW (ref >=40)
LDL Cholesterol (Calc): 71 mg/dL (calc)
Non-HDL Cholesterol (Calc): 86 mg/dL (calc) (ref <130)
Total CHOL/HDL Ratio: 3.4 (calc) (ref <5.0)
Triglycerides: 69 mg/dL (ref <150)

## 2018-11-30 ENCOUNTER — Encounter: Payer: Self-pay | Admitting: Internal Medicine

## 2018-11-30 ENCOUNTER — Non-Acute Institutional Stay: Payer: Medicare Other | Admitting: Internal Medicine

## 2018-11-30 ENCOUNTER — Other Ambulatory Visit: Payer: Self-pay

## 2018-11-30 VITALS — BP 144/80 | HR 66 | Temp 97.6°F | Ht 69.0 in | Wt 160.8 lb

## 2018-11-30 DIAGNOSIS — G459 Transient cerebral ischemic attack, unspecified: Secondary | ICD-10-CM | POA: Diagnosis not present

## 2018-11-30 DIAGNOSIS — L570 Actinic keratosis: Secondary | ICD-10-CM | POA: Diagnosis not present

## 2018-11-30 DIAGNOSIS — D1801 Hemangioma of skin and subcutaneous tissue: Secondary | ICD-10-CM | POA: Diagnosis not present

## 2018-11-30 DIAGNOSIS — E785 Hyperlipidemia, unspecified: Secondary | ICD-10-CM | POA: Diagnosis not present

## 2018-11-30 DIAGNOSIS — H906 Mixed conductive and sensorineural hearing loss, bilateral: Secondary | ICD-10-CM

## 2018-11-30 DIAGNOSIS — I10 Essential (primary) hypertension: Secondary | ICD-10-CM

## 2018-11-30 DIAGNOSIS — L821 Other seborrheic keratosis: Secondary | ICD-10-CM | POA: Diagnosis not present

## 2018-11-30 NOTE — Addendum Note (Signed)
Addended by: Georgina Snell on: 11/30/2018 07:56 PM   Modules accepted: Level of Service

## 2018-11-30 NOTE — Progress Notes (Signed)
Location:  [place] of Service:  Clinic (12)  Provider:   Code Status:  Goals of Care:  Advanced Directives 11/30/2018  Does Patient Have a Medical Advance Directive? Yes  Type of Paramedic of [place];Out of facility DNR (pink MOST or yellow form)  Does patient want to make changes to medical advance directive? No - Patient declined  Copy of [place] in Chart? Yes - validated most recent copy scanned in chart (See row information)  Would patient like information on creating a medical advance directive? -  Pre-existing out of facility DNR order (yellow form or pink MOST form) Yellow form placed in chart (order not valid for inpatient use)     Chief Complaint  Patient presents with  . Medical Management of Chronic Issues    4 month follow up,discuss lab results    HPI: Patient is a 85 y.o. male seen today for medical management of chronic diseases.   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  Patient here for his Follow up. He lives by himself in [place]. Very active stiil drives He follows with Neurology for his atypical symptoms of Visual Impairment. With Aphasia and Word finding.He has had detail work ups including EEG, Echo and MRI .which have been Negative. They have recommended Asprin, BP control and Statin. Patient still gets these Episodes every few months. He says they are unpredictable and usually last 5-10 min. No Change in his episodes and no recent Worsening His BP by Facility nurse has been SBP from 135-140 He is exercising in his room now due to restrictions. Taking all Covid precautions. No New issues today   Past Medical History:  Diagnosis Date  . Actinic keratosis 10/18/2012  . 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   . Atrophy, Fuchs' 12/10/2011  . 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 Cervicalgia  04/25/2013  . 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 Cyclitis, chronic 11/18/2012  . Deafness 10/18/2012  . Endothelial corneal dystrophy   . Essential hypertension 10/18/2012  . Hammer toe 04/24/2014  . Hyperlipidemia 10/18/2012  . Intermediate stage nonexudative age-related macular degeneration of both eyes 04/16/2016  . Nocturia 05/19/2016  . Occlusion and stenosis of carotid artery with cerebral infarction   . Other specified cardiac dysrhythmias(427.89)   . Pain in joint, pelvic region and thigh   . Paresthesia 04/30/2015   Toes of both feet   . Peripheral vascular disease, unspecified ([place])   . Primary open angle glaucoma 09/25/2011  . 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  . Rosacea   . Seborrheic keratosis 04/24/2014  . Syncope 06/27/2013  . TIA (transient ischemic attack) 10/18/2012   States he has had about 5  times  . Urine frequency     Past Surgical History:  Procedure Laterality Date  . CATARACT EXTRACTION  2005   bilateral  . CORNEAL TRANSPLANT  2010 left; 2013 right eye  . HERNIA REPAIR  1985   Left  . HERNIA REPAIR  03/2004   Right  . SHOULDER DEBRIDEMENT Left 2005  . TONSILLECTOMY Bilateral childhood  . TRANSURETHRAL RESECTION OF PROSTATE N/A 10/2003  . VASECTOMY      No Known Allergies  Outpatient Encounter Medications as of 11/30/2018  Medication Sig  . aspirin 325 MG tablet Take 325 mg by mouth every evening.  Marland Kitchen atorvastatin (LIPITOR) 40 MG tablet Take 1 tablet (40 mg total) by  mouth daily.  . DORZOLAMIDE HCL-TIMOLOL MAL OP Apply to eye.  . enalapril (VASOTEC) 20 MG tablet Take one tablet by mouth twice daily to control blood pressure  . loratadine (CLARITIN) 10 MG tablet Take 10 mg by mouth daily.  . Multiple Vitamins-Minerals (PRESERVISION AREDS 2) CAPS Take 1 capsule by mouth 2 (two) times daily.  . Omega-3 Fatty Acids (FISH OIL) 1000 MG CAPS Take 1,000 mg by mouth 2 (two) times daily.   . Travoprost, BAK Free, (TRAVATAN Z) 0.004 % SOLN ophthalmic solution Place 1 drop  into the left eye at bedtime. One drop left eye once a day for glaucoma    No facility-administered encounter medications on file as of 11/30/2018.     Review of Systems:  Review of Systems  Review of Systems  Constitutional: Negative for activity change, appetite change, chills, diaphoresis, fatigue and fever.  HENT: Negative for mouth sores, postnasal drip, rhinorrhea, sinus pain and sore throat.   Respiratory: Negative for apnea, cough, chest tightness, shortness of breath and wheezing.   Cardiovascular: Negative for chest pain, palpitations and leg swelling.  Gastrointestinal: Negative for abdominal distention, abdominal pain, constipation, diarrhea, nausea and vomiting.  Genitourinary: Negative for dysuria and frequency.  Musculoskeletal: Negative for arthralgias, joint swelling and myalgias.  Skin: Negative for rash.  Neurological: Negative for dizziness, syncope, weakness, light-headedness and numbness.  Psychiatric/Behavioral: Negative for behavioral problems, confusion and sleep disturbance.     Health Maintenance  Topic Date Due  . INFLUENZA VACCINE  02/18/2019  . TETANUS/TDAP  02/25/2028  . PNA vac Low Risk Adult  Completed    Physical Exam: Vitals:   11/30/18 1510  BP: (!) 144/80  Pulse: 66  Temp: 97.6 F (36.4 C)  TempSrc: Oral  SpO2: 96%  Weight: 160 lb 12.8 oz (72.9 kg)  Height: 5\' 9"  (1.753 m)   Body mass index is 23.75 kg/m. Physical Exam  Constitutional: Oriented to person, place, and time. Well-developed and well-nourished.  HENT:  Head: Normocephalic.  Mouth/Throat: Oropharynx is clear and moist.  Eyes: Pupils are equal, round, and reactive to light.  Neck: Neck supple.  Cardiovascular: Normal rate and normal heart sounds.  No murmur heard. Pulmonary/Chest: Effort normal and breath sounds normal. No respiratory distress. No wheezes. She has no rales.  Abdominal: Soft. Bowel sounds are normal. No distension. There is no tenderness. There is no  rebound.  Musculoskeletal: No edema.  Lymphadenopathy: none Neurological: Alert and oriented to person, place, and time.  Skin: Skin is warm and dry.  Psychiatric: Normal mood and affect. Behavior is normal. Thought content normal.    Labs reviewed: Basic Metabolic Panel: Recent Labs    04/22/18 0000 07/19/18 0000 11/23/18 0000  NA 139 139 141  K 4.2 4.2 4.3  CL 104 105 107  CO2 28 28 29   GLUCOSE 81 82 85  BUN 22 21 20   CREATININE 0.88 0.84 0.85  CALCIUM 8.6 8.7 8.6   Liver Function Tests: Recent Labs    01/05/18 1201 04/22/18 0000 07/19/18 0000 11/23/18 0000  AST 28 21 25 22   ALT 24 18 25 18   ALKPHOS 48  --   --   --   BILITOT 1.1 0.9 0.9 1.0  PROT 5.9* 5.6* 6.0* 6.0*  ALBUMIN 3.7  --   --   --    No results for input(s): LIPASE, AMYLASE in the last 8760 hours. No results for input(s): AMMONIA in the last 8760 hours. CBC: Recent Labs    01/05/18 1201  04/22/18 0000 07/19/18 0000 11/23/18 0000  WBC 7.6  --  6.5 7.8 7.8  NEUTROABS 4.5  --   --  4,898 5,015  HGB 15.1   < > 14.4 15.1 15.2  HCT 44.8   < > 42.3 43.7 43.4  MCV 88.7  --  87.6 87.8 86.6  PLT 152  --  184 175 188   < > = values in this interval not displayed.   Lipid Panel: Recent Labs    04/22/18 0000 07/19/18 0000 11/23/18 0000  CHOL 114 149 122  HDL 38* 44 36*  LDLCALC 61 90 71  TRIG 73 68 69  CHOLHDL 3.0 3.4 3.4   No results found for: HGBA1C  Procedures since last visit: No results found.  Assessment/Plan  Hypertension He is maintaining his Log BP looking stable at home Continue Enalapril  ? TIA/ Atypical Migraine He has Follow up with Neuro schedule No Recent changes or worsening of his Symptoms Continue on Aspirin and statin Hyperlipidemia Continue on Lipitor 40 mg LDL 71 Repeat in 6 months Continue Diet Modification and exercise   Labs/tests ordered:   Next appt:  6 months  Total time spent in this patient care encounter was  25_  minutes; greater than 50% of  the visit spent counseling patient and staff, reviewing records , Labs and coordinating care for problems addressed at this encounter.

## 2018-12-09 DIAGNOSIS — M79671 Pain in right foot: Secondary | ICD-10-CM | POA: Diagnosis not present

## 2018-12-09 DIAGNOSIS — B351 Tinea unguium: Secondary | ICD-10-CM | POA: Diagnosis not present

## 2018-12-09 DIAGNOSIS — M79672 Pain in left foot: Secondary | ICD-10-CM | POA: Diagnosis not present

## 2018-12-29 ENCOUNTER — Ambulatory Visit: Payer: Medicare Other | Admitting: Adult Health

## 2019-02-17 DIAGNOSIS — M79672 Pain in left foot: Secondary | ICD-10-CM | POA: Diagnosis not present

## 2019-02-17 DIAGNOSIS — M79671 Pain in right foot: Secondary | ICD-10-CM | POA: Diagnosis not present

## 2019-02-17 DIAGNOSIS — B351 Tinea unguium: Secondary | ICD-10-CM | POA: Diagnosis not present

## 2019-03-13 ENCOUNTER — Other Ambulatory Visit: Payer: Self-pay | Admitting: Internal Medicine

## 2019-04-13 DIAGNOSIS — R2689 Other abnormalities of gait and mobility: Secondary | ICD-10-CM | POA: Diagnosis not present

## 2019-04-13 DIAGNOSIS — M6281 Muscle weakness (generalized): Secondary | ICD-10-CM | POA: Diagnosis not present

## 2019-04-13 DIAGNOSIS — M25571 Pain in right ankle and joints of right foot: Secondary | ICD-10-CM | POA: Diagnosis not present

## 2019-04-17 DIAGNOSIS — M25571 Pain in right ankle and joints of right foot: Secondary | ICD-10-CM | POA: Diagnosis not present

## 2019-04-17 DIAGNOSIS — M6281 Muscle weakness (generalized): Secondary | ICD-10-CM | POA: Diagnosis not present

## 2019-04-17 DIAGNOSIS — R2689 Other abnormalities of gait and mobility: Secondary | ICD-10-CM | POA: Diagnosis not present

## 2019-04-20 DIAGNOSIS — R2681 Unsteadiness on feet: Secondary | ICD-10-CM | POA: Diagnosis not present

## 2019-04-20 DIAGNOSIS — R2689 Other abnormalities of gait and mobility: Secondary | ICD-10-CM | POA: Diagnosis not present

## 2019-04-20 DIAGNOSIS — M6281 Muscle weakness (generalized): Secondary | ICD-10-CM | POA: Diagnosis not present

## 2019-04-20 DIAGNOSIS — M25571 Pain in right ankle and joints of right foot: Secondary | ICD-10-CM | POA: Diagnosis not present

## 2019-04-25 DIAGNOSIS — R2689 Other abnormalities of gait and mobility: Secondary | ICD-10-CM | POA: Diagnosis not present

## 2019-04-25 DIAGNOSIS — M6281 Muscle weakness (generalized): Secondary | ICD-10-CM | POA: Diagnosis not present

## 2019-04-25 DIAGNOSIS — M25571 Pain in right ankle and joints of right foot: Secondary | ICD-10-CM | POA: Diagnosis not present

## 2019-04-25 DIAGNOSIS — R2681 Unsteadiness on feet: Secondary | ICD-10-CM | POA: Diagnosis not present

## 2019-04-27 DIAGNOSIS — R2689 Other abnormalities of gait and mobility: Secondary | ICD-10-CM | POA: Diagnosis not present

## 2019-04-27 DIAGNOSIS — M6281 Muscle weakness (generalized): Secondary | ICD-10-CM | POA: Diagnosis not present

## 2019-04-27 DIAGNOSIS — R2681 Unsteadiness on feet: Secondary | ICD-10-CM | POA: Diagnosis not present

## 2019-04-27 DIAGNOSIS — M25571 Pain in right ankle and joints of right foot: Secondary | ICD-10-CM | POA: Diagnosis not present

## 2019-04-28 DIAGNOSIS — B351 Tinea unguium: Secondary | ICD-10-CM | POA: Diagnosis not present

## 2019-04-28 DIAGNOSIS — M79671 Pain in right foot: Secondary | ICD-10-CM | POA: Diagnosis not present

## 2019-04-28 DIAGNOSIS — M79672 Pain in left foot: Secondary | ICD-10-CM | POA: Diagnosis not present

## 2019-05-02 DIAGNOSIS — R2689 Other abnormalities of gait and mobility: Secondary | ICD-10-CM | POA: Diagnosis not present

## 2019-05-02 DIAGNOSIS — R2681 Unsteadiness on feet: Secondary | ICD-10-CM | POA: Diagnosis not present

## 2019-05-02 DIAGNOSIS — M25571 Pain in right ankle and joints of right foot: Secondary | ICD-10-CM | POA: Diagnosis not present

## 2019-05-02 DIAGNOSIS — M6281 Muscle weakness (generalized): Secondary | ICD-10-CM | POA: Diagnosis not present

## 2019-05-05 DIAGNOSIS — M25571 Pain in right ankle and joints of right foot: Secondary | ICD-10-CM | POA: Diagnosis not present

## 2019-05-05 DIAGNOSIS — R2689 Other abnormalities of gait and mobility: Secondary | ICD-10-CM | POA: Diagnosis not present

## 2019-05-05 DIAGNOSIS — M6281 Muscle weakness (generalized): Secondary | ICD-10-CM | POA: Diagnosis not present

## 2019-05-05 DIAGNOSIS — R2681 Unsteadiness on feet: Secondary | ICD-10-CM | POA: Diagnosis not present

## 2019-05-09 DIAGNOSIS — R2681 Unsteadiness on feet: Secondary | ICD-10-CM | POA: Diagnosis not present

## 2019-05-09 DIAGNOSIS — M6281 Muscle weakness (generalized): Secondary | ICD-10-CM | POA: Diagnosis not present

## 2019-05-09 DIAGNOSIS — R2689 Other abnormalities of gait and mobility: Secondary | ICD-10-CM | POA: Diagnosis not present

## 2019-05-09 DIAGNOSIS — M25571 Pain in right ankle and joints of right foot: Secondary | ICD-10-CM | POA: Diagnosis not present

## 2019-05-25 ENCOUNTER — Telehealth: Payer: Self-pay | Admitting: Internal Medicine

## 2019-05-25 DIAGNOSIS — H9190 Unspecified hearing loss, unspecified ear: Secondary | ICD-10-CM

## 2019-05-25 NOTE — Telephone Encounter (Signed)
Pt wants referral to [facility] for hearing test.  Pt already has appt for 05/29/19 2p  Please fax referral to [phone]  Thanks, Vilinda Blanks.

## 2019-05-29 DIAGNOSIS — Z570 Occupational exposure to noise: Secondary | ICD-10-CM | POA: Diagnosis not present

## 2019-05-29 DIAGNOSIS — H903 Sensorineural hearing loss, bilateral: Secondary | ICD-10-CM | POA: Diagnosis not present

## 2019-05-30 ENCOUNTER — Other Ambulatory Visit: Payer: Self-pay | Admitting: Internal Medicine

## 2019-05-30 DIAGNOSIS — H9190 Unspecified hearing loss, unspecified ear: Secondary | ICD-10-CM

## 2019-05-30 NOTE — Telephone Encounter (Signed)
Faxed referral to [facility] 05/30/19/LM

## 2019-05-30 NOTE — Telephone Encounter (Signed)
Allie with [facility] called stating patient was seen yesterday and they have yet to receive referral order as requested.   Allie stressed how important it is that we get this referral order placed and faxed to them today to avoid patient having to pay for yesterday's visit.   Fanny Skates was informed that a message was taken and sent to the provider on 05/25/2019 and another message will be sent today in hopes of getting request fulfilled.    Dr.Gupta please place audiology order and Lattie Haw will fax to [facility].   Thanks, S.Chrae B/CMA

## 2019-06-01 ENCOUNTER — Other Ambulatory Visit: Payer: Medicare Other

## 2019-06-01 ENCOUNTER — Other Ambulatory Visit: Payer: Self-pay

## 2019-06-01 ENCOUNTER — Telehealth: Payer: Self-pay | Admitting: *Deleted

## 2019-06-01 NOTE — Telephone Encounter (Signed)
Patient called very upset. Stated that he was called yesterday regarding fasting lab appointment he was suppose to have at [facility] this morning, he confirmed. Stated that he got down there this morning at 8:00 and the lab tech did not know nothing about it and sent him away without drawing any labs. Patient stated that obviously the left hand doesn't know what the right hand is doing. Patient did have a confirmed lab appointment on our schedule for today.  Patient needs to be rescheduled and called. Please Advise.

## 2019-06-01 NOTE — Telephone Encounter (Signed)
Russell [name] can you consult with Russell [name] ([facility] lab tech) to confirm that she followed the standard of work process for facility lab collect.  Also you may need to consult with [place] Careers adviser) verbally.  Thanks, S.Chrae Maxamillion Banas/CMA, Clinical Team Lead

## 2019-06-01 NOTE — Telephone Encounter (Signed)
Patient is scheduled for the 16th at [facility].

## 2019-06-02 ENCOUNTER — Telehealth: Payer: Self-pay

## 2019-06-02 DIAGNOSIS — Z85828 Personal history of other malignant neoplasm of skin: Secondary | ICD-10-CM | POA: Diagnosis not present

## 2019-06-02 DIAGNOSIS — L570 Actinic keratosis: Secondary | ICD-10-CM | POA: Diagnosis not present

## 2019-06-02 DIAGNOSIS — D225 Melanocytic nevi of trunk: Secondary | ICD-10-CM | POA: Diagnosis not present

## 2019-06-02 DIAGNOSIS — L820 Inflamed seborrheic keratosis: Secondary | ICD-10-CM | POA: Diagnosis not present

## 2019-06-02 DIAGNOSIS — D1801 Hemangioma of skin and subcutaneous tissue: Secondary | ICD-10-CM | POA: Diagnosis not present

## 2019-06-02 DIAGNOSIS — L821 Other seborrheic keratosis: Secondary | ICD-10-CM | POA: Diagnosis not present

## 2019-06-02 DIAGNOSIS — C44319 Basal cell carcinoma of skin of other parts of face: Secondary | ICD-10-CM | POA: Diagnosis not present

## 2019-06-02 NOTE — Telephone Encounter (Signed)
Bary Leriche from [facility] called to find out about what happened that the patient was told to go for labs and when he arrived was told there weren't any orders for him and he had to be rescheduled she feels as though this is not fare to the patient as he was made to get up early for this appointment and then be sent away because the orders could not be found I spoke with Hassan Rowan from the lab and she said she did not see any orders for him but after I spoke with Chare and we checked the chart of the patient found that the orders had been released the day before but not faxed to facility Hassan Rowan says something was wrong with her computer and printer that day and she had spoke with Faythe Dingwall to help her resolve this issue but she was told by Chrae that in the future if this happens again to come to one of the clinical staff and have [place] print the order for her she agreed the nurse would like to speak with Faythe Dingwall about this situation I informed her that I would relay the message to [place] e-mail sent to [place] to see note in patients hart

## 2019-06-05 ENCOUNTER — Other Ambulatory Visit: Payer: Self-pay

## 2019-06-05 DIAGNOSIS — I10 Essential (primary) hypertension: Secondary | ICD-10-CM | POA: Diagnosis not present

## 2019-06-05 DIAGNOSIS — G459 Transient cerebral ischemic attack, unspecified: Secondary | ICD-10-CM | POA: Diagnosis not present

## 2019-06-05 LAB — COMPLETE METABOLIC PANEL WITH GFR
AG Ratio: 2 (calc) (ref 1.0–2.5)
ALT: 20 U/L (ref 9–46)
AST: 21 U/L (ref 10–35)
Albumin: 4 g/dL (ref 3.6–5.1)
Alkaline phosphatase (APISO): 44 U/L (ref 35–144)
BUN: 20 mg/dL (ref 7–25)
CO2: 29 mmol/L (ref 20–32)
Calcium: 8.9 mg/dL (ref 8.6–10.3)
Chloride: 107 mmol/L (ref 98–110)
Creat: 0.94 mg/dL (ref 0.70–1.11)
GFR, Est African American: 82 mL/min/{1.73_m2} (ref >=60)
GFR, Est Non African American: 71 mL/min/{1.73_m2} (ref >=60)
Globulin: 2 g/dL (calc) (ref 1.9–3.7)
Glucose, Bld: 82 mg/dL (ref 65–99)
Potassium: 4.4 mmol/L (ref 3.5–5.3)
Sodium: 142 mmol/L (ref 135–146)
Total Bilirubin: 1.1 mg/dL (ref 0.2–1.2)
Total Protein: 6 g/dL — ABNORMAL LOW (ref 6.1–8.1)

## 2019-06-05 LAB — LIPID PANEL
Cholesterol: 118 mg/dL (ref <200)
HDL: 38 mg/dL — ABNORMAL LOW (ref >=40)
LDL Cholesterol (Calc): 65 mg/dL (calc)
Non-HDL Cholesterol (Calc): 80 mg/dL (calc) (ref <130)
Total CHOL/HDL Ratio: 3.1 (calc) (ref <5.0)
Triglycerides: 69 mg/dL (ref <150)

## 2019-06-07 ENCOUNTER — Non-Acute Institutional Stay: Payer: Medicare Other | Admitting: Internal Medicine

## 2019-06-07 ENCOUNTER — Encounter: Payer: Self-pay | Admitting: Internal Medicine

## 2019-06-07 ENCOUNTER — Other Ambulatory Visit: Payer: Self-pay

## 2019-06-07 VITALS — BP 152/79 | HR 58 | Temp 97.6°F | Ht 69.0 in | Wt 160.4 lb

## 2019-06-07 DIAGNOSIS — F339 Major depressive disorder, recurrent, unspecified: Secondary | ICD-10-CM | POA: Insufficient documentation

## 2019-06-07 DIAGNOSIS — H906 Mixed conductive and sensorineural hearing loss, bilateral: Secondary | ICD-10-CM | POA: Diagnosis not present

## 2019-06-07 DIAGNOSIS — I10 Essential (primary) hypertension: Secondary | ICD-10-CM | POA: Diagnosis not present

## 2019-06-07 DIAGNOSIS — E785 Hyperlipidemia, unspecified: Secondary | ICD-10-CM | POA: Diagnosis not present

## 2019-06-07 DIAGNOSIS — F320 Major depressive disorder, single episode, mild: Secondary | ICD-10-CM | POA: Diagnosis not present

## 2019-06-07 NOTE — Progress Notes (Signed)
Location:  [place] of Service:  Clinic (12)  Provider:   Code Status:  Goals of Care:  Advanced Directives 11/30/2018  Does Patient Have a Medical Advance Directive? Yes  Type of Paramedic of [place];Out of facility DNR (pink MOST or yellow form)  Does patient want to make changes to medical advance directive? No - Patient declined  Copy of [place] in Chart? Yes - validated most recent copy scanned in chart (See row information)  Would patient like information on creating a medical advance directive? -  Pre-existing out of facility DNR order (yellow form or pink MOST form) Yellow form placed in chart (order not valid for inpatient use)     Chief Complaint  Patient presents with  . Medical Management of Chronic Issues    6 month follow up. Patient would like to discuss finding a liscensed Education officer, museum and lab results.    HPI: Patient is a 85 y.o. male seen today for medical management of chronic diseases.    Hypertension Gets his blood pressure checked by facility nurse every week Is usually SBP from 135-1 40 History of TIA versus migraine Had detailed work-up by neurology for his atypical symptoms of visual impairment. With Aphasia and Word finding.He has had detail work ups including EEG, Echo and MRI .which have been Negative. They have recommended Asprin, BP control and Statin. Has not had any episodes since his last visit with me Does not want to follow-up with neurology right now Hyperlipidemia Tolerating Lipitor  Possible Depression Patient today asked me if he can talk to a psychologist as he has been struggling with some unresolved issues in his life.  Has never talked to anyone before.  Denies ever been on antidepressant He states he lost his daughter who was 63 in a car accident and feels responsible for it He also lost his wife in 2018 His appetite is fair and weight is stable He continues to  walk around the facility.  He has not had any falls recently does not use walker okay Past Medical History:  Diagnosis Date  . Actinic keratosis 10/18/2012  . 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   . Atrophy, Fuchs' 12/10/2011  . 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 Cervicalgia 04/25/2013  . 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 Cyclitis, chronic 11/18/2012  . Deafness 10/18/2012  . Endothelial corneal dystrophy   . Essential hypertension 10/18/2012  . Hammer toe 04/24/2014  . Hyperlipidemia 10/18/2012  . Intermediate stage nonexudative age-related macular degeneration of both eyes 04/16/2016  . Nocturia 05/19/2016  . Occlusion and stenosis of carotid artery with cerebral infarction   . Other specified cardiac dysrhythmias(427.89)   . Pain in joint, pelvic region and thigh   . Paresthesia 04/30/2015   Toes of both feet   . Peripheral vascular disease, unspecified ([place])   . Primary open angle glaucoma 09/25/2011  . 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  . Rosacea   . Seborrheic keratosis 04/24/2014  . Syncope 06/27/2013  . TIA (transient ischemic attack) 10/18/2012   States he has had about 5  times  . Urine frequency     Past Surgical History:  Procedure Laterality Date  . CATARACT EXTRACTION  2005   bilateral  . CORNEAL TRANSPLANT  2010 left; 2013 right eye  . HERNIA REPAIR  1985   Left  . HERNIA REPAIR  03/2004   Right  .  SHOULDER DEBRIDEMENT Left 2005  . TONSILLECTOMY Bilateral childhood  . TRANSURETHRAL RESECTION OF PROSTATE N/A 10/2003  . VASECTOMY      No Known Allergies  Outpatient Encounter Medications as of 06/07/2019  Medication Sig  . aspirin 325 MG tablet Take 325 mg by mouth every evening.  Marland Kitchen atorvastatin (LIPITOR) 40 MG tablet Take 1 tablet (40 mg total) by mouth daily.  . DORZOLAMIDE HCL-TIMOLOL MAL OP Apply to eye.  . enalapril (VASOTEC) 20 MG tablet TAKE 1 TABLET TWICE A DAY TO CONTROL BLOOD PRESSURE  . loratadine  (CLARITIN) 10 MG tablet Take 10 mg by mouth daily.  . Multiple Vitamins-Minerals (PRESERVISION AREDS 2) CAPS Take 1 capsule by mouth 2 (two) times daily.  . Omega-3 Fatty Acids (FISH OIL) 1000 MG CAPS Take 1,000 mg by mouth 2 (two) times daily.   . Travoprost, BAK Free, (TRAVATAN Z) 0.004 % SOLN ophthalmic solution Place 1 drop into the left eye at bedtime. One drop left eye once a day for glaucoma    No facility-administered encounter medications on file as of 06/07/2019.     Review of Systems:  Review of Systems  Review of Systems  Constitutional: Negative for activity change, appetite change, chills, diaphoresis, fatigue and fever.  HENT: Negative for mouth sores, postnasal drip, rhinorrhea, sinus pain and sore throat.   Respiratory: Negative for apnea, cough, chest tightness, shortness of breath and wheezing.   Cardiovascular: Negative for chest pain, palpitations and leg swelling.  Gastrointestinal: Negative for abdominal distention, abdominal pain, constipation, diarrhea, nausea and vomiting.  Genitourinary: Negative for dysuria and frequency.  Musculoskeletal: Negative for arthralgias, joint swelling and myalgias.  Skin: Negative for rash.  Neurological: Negative for dizziness, syncope, weakness, light-headedness and numbness.  Psychiatric/Behavioral: Negative for behavioral problems, confusion and sleep disturbance.     Health Maintenance  Topic Date Due  . TETANUS/TDAP  02/25/2028  . INFLUENZA VACCINE  Completed  . PNA vac Low Risk Adult  Completed    Physical Exam: Vitals:   06/07/19 1302  BP: (!) 152/79  Pulse: (!) 58  Temp: 97.6 F (36.4 C)  SpO2: 97%  Weight: 160 lb 6.4 oz (72.8 kg)  Height: 5\' 9"  (1.753 m)   Body mass index is 23.69 kg/m. Physical Exam  Constitutional: Oriented to person, place, and time. Well-developed and well-nourished.  HENT:  Head: Normocephalic.  Mouth/Throat: Oropharynx is clear and moist.  Eyes: Pupils are equal, round, and  reactive to light.  Neck: Neck supple.  Cardiovascular: Normal rate and normal heart sounds.  No murmur heard. Pulmonary/Chest: Effort normal and breath sounds normal. No respiratory distress. No wheezes. She has no rales.  Abdominal: Soft. Bowel sounds are normal. No distension. There is no tenderness. There is no rebound.  Musculoskeletal: No edema.  Lymphadenopathy: none Neurological: Alert and oriented to person, place, and time. Gait is steady Skin: Skin is warm and dry.  Psychiatric: Normal mood and affect. Behavior is normal. Thought content normal.  Though Does look little Sad   Labs reviewed: Basic Metabolic Panel: Recent Labs    07/19/18 0000 11/23/18 0000 06/05/19 0805  NA 139 141 142  K 4.2 4.3 4.4  CL 105 107 107  CO2 28 29 29   GLUCOSE 82 85 82  BUN 21 20 20   CREATININE 0.84 0.85 0.94  CALCIUM 8.7 8.6 8.9   Liver Function Tests: Recent Labs    07/19/18 0000 11/23/18 0000 06/05/19 0805  AST 25 22 21   ALT 25 18 20   BILITOT  0.9 1.0 1.1  PROT 6.0* 6.0* 6.0*   No results for input(s): LIPASE, AMYLASE in the last 8760 hours. No results for input(s): AMMONIA in the last 8760 hours. CBC: Recent Labs    07/19/18 0000 11/23/18 0000  WBC 7.8 7.8  NEUTROABS 4,898 5,015  HGB 15.1 15.2  HCT 43.7 43.4  MCV 87.8 86.6  PLT 175 188   Lipid Panel: Recent Labs    07/19/18 0000 11/23/18 0000 06/05/19 0805  CHOL 149 122 118  HDL 44 36* 38*  LDLCALC 90 71 65  TRIG 68 69 69  CHOLHDL 3.4 3.4 3.1   No results found for: HGBA1C  Procedures since last visit: No results found.  Assessment/Plan Hypertension He is maintaining his Log BP doing well  Continue Enalapril  ? TIA/ Atypical Migraine No Recent Symptoms Does not want to see Neurology right now Continue Lipid control, BP and ASpirin Hyperlipidemia Continue on Lipitor 40 mg LDL 65 Continue Same dose Depression Will check MMSE and PHS next visit in 3 motnhs Refer to Psychologist Refusing to  take Antidepressant right now HR less then 60 Will do EKG on next visit   Labs/tests ordered:  * No order type specified * Next appt:  Visit date not found   Total time spent in this patient care encounter was 25 _  minutes; greater than 50% of the visit spent counseling patient and staff, reviewing records , Labs and coordinating care for problems addressed at this encounter.

## 2019-07-24 DIAGNOSIS — Z23 Encounter for immunization: Secondary | ICD-10-CM | POA: Diagnosis not present

## 2019-08-14 DIAGNOSIS — H401131 Primary open-angle glaucoma, bilateral, mild stage: Secondary | ICD-10-CM | POA: Diagnosis not present

## 2019-08-14 DIAGNOSIS — Z961 Presence of intraocular lens: Secondary | ICD-10-CM | POA: Diagnosis not present

## 2019-08-21 DIAGNOSIS — Z23 Encounter for immunization: Secondary | ICD-10-CM | POA: Diagnosis not present

## 2019-08-23 ENCOUNTER — Telehealth: Payer: Self-pay

## 2019-08-23 NOTE — Telephone Encounter (Signed)
Patient aware of Dr. Steve Rattler response and states he will keep follow-up as scheduled. Patient understands no lab work is needed prior.

## 2019-08-23 NOTE — Telephone Encounter (Signed)
Incoming call received from patient questioning pending appointment Patient asked" Am I really suppose to have an appointment that soon, for it seems that I recently seen Dr.Gupta?"  I advised patient that appointment was scheduled on 06/07/2019 at  last appt and he was probably advised to have a 3 month follow-up, which would be in February.  Patient also questions if he is suppose to have labs prior.  I reviewed last OV note and patient instructions. There was no instructions to relay. I informed patient that I will verify with Dr.Gupta and her medical assistant

## 2019-08-23 NOTE — Telephone Encounter (Signed)
He needs Follow up as he was c/o Depression last visit and wanted to come back to discuss this again. If he says he is better and does not want to see me we can reschedule him for follow up in 3 more months He does not need Lab work.

## 2019-09-13 ENCOUNTER — Non-Acute Institutional Stay: Payer: Medicare Other | Admitting: Internal Medicine

## 2019-09-13 ENCOUNTER — Other Ambulatory Visit: Payer: Self-pay

## 2019-09-13 ENCOUNTER — Encounter: Payer: Self-pay | Admitting: Internal Medicine

## 2019-09-13 VITALS — BP 165/90 | HR 60 | Temp 97.8°F | Ht 69.0 in

## 2019-09-13 DIAGNOSIS — I10 Essential (primary) hypertension: Secondary | ICD-10-CM

## 2019-09-13 DIAGNOSIS — F320 Major depressive disorder, single episode, mild: Secondary | ICD-10-CM

## 2019-09-13 NOTE — Progress Notes (Signed)
Location: [place] of Service:  Clinic (12)  Provider:   Code Status:  Goals of Care:  Advanced Directives 11/30/2018  Does Patient Have a Medical Advance Directive? Yes  Type of Paramedic of [place];Out of facility DNR (pink MOST or yellow form)  Does patient want to make changes to medical advance directive? No - Patient declined  Copy of [place] in Chart? Yes - validated most recent copy scanned in chart (See row information)  Would patient like information on creating a medical advance directive? -  Pre-existing out of facility DNR order (yellow form or pink MOST form) Yellow form placed in chart (order not valid for inpatient use)     Chief Complaint  Patient presents with  . Medical Management of Chronic Issues    Follow Up    HPI: Patient is a 86 y.o. male seen today for an acute visit for Follow up of BP, HR and Depression  Hypertension BP by facility Nurse had  been around 130-150  Depression Last visit he discussed about his depression He says hi s Father was alcoholic and his daughter died due to accident Has one son in [place] He feels depressed sometimes thinking of his daughter Weight is stable He also lost his wife in 2018 His appetite is fair   Other Issues  History of TIA versus migraine Had detailed work-up by neurology for his atypical symptoms of visual impairment. With Aphasia and Word finding.He has had detail work ups including EEG, Echo and MRI .which have been Negative. They have recommended Asprin, BP control and Statin. Has not had any episodes since his last visit with me Does not want to follow-up with neurology right now  He continues to walk around the facility.  He has not had any falls recently does not use walker okay  Past Medical History:  Diagnosis Date  . Actinic keratosis 10/18/2012  . Anisocoria 10/30/2014   Left pupil is larger than the right postsurgery corneal  transplant.   . Atrophy, Fuchs' 12/10/2011  . 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 Cervicalgia 04/25/2013  . 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 Cyclitis, chronic 11/18/2012  . Deafness 10/18/2012  . Endothelial corneal dystrophy   . Essential hypertension 10/18/2012  . Hammer toe 04/24/2014  . Hyperlipidemia 10/18/2012  . Intermediate stage nonexudative age-related macular degeneration of both eyes 04/16/2016  . Nocturia 05/19/2016  . Occlusion and stenosis of carotid artery with cerebral infarction   . Other specified cardiac dysrhythmias(427.89)   . Pain in joint, pelvic region and thigh   . Paresthesia 04/30/2015   Toes of both feet   . Peripheral vascular disease, unspecified ([place])   . Primary open angle glaucoma 09/25/2011  . 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  . Rosacea   . Seborrheic keratosis 04/24/2014  . Syncope 06/27/2013  . TIA (transient ischemic attack) 10/18/2012   States he has had about 5  times  . Urine frequency     Past Surgical History:  Procedure Laterality Date  . CATARACT EXTRACTION  2005   bilateral  . CORNEAL TRANSPLANT  2010 left; 2013 right eye  . HERNIA REPAIR  1985   Left  . HERNIA REPAIR  03/2004   Right  . SHOULDER DEBRIDEMENT Left 2005  . TONSILLECTOMY Bilateral childhood  . TRANSURETHRAL RESECTION OF PROSTATE N/A 10/2003  . VASECTOMY      No Known Allergies  Outpatient Encounter Medications as of 09/13/2019  Medication  Sig  . aspirin 325 MG tablet Take 325 mg by mouth every evening.  Marland Kitchen atorvastatin (LIPITOR) 40 MG tablet Take 1 tablet (40 mg total) by mouth daily.  . DORZOLAMIDE HCL-TIMOLOL MAL OP Apply to eye.  . enalapril (VASOTEC) 20 MG tablet TAKE 1 TABLET TWICE A DAY TO CONTROL BLOOD PRESSURE  . loratadine (CLARITIN) 10 MG tablet Take 10 mg by mouth daily.  . Multiple Vitamins-Minerals (PRESERVISION AREDS 2) CAPS Take 1 capsule by mouth 2 (two) times daily.  . Omega-3 Fatty Acids (FISH OIL) 1000 MG CAPS Take 1,000 mg by mouth 2 (two) times  daily.   . Travoprost, BAK Free, (TRAVATAN Z) 0.004 % SOLN ophthalmic solution Place 1 drop into the left eye at bedtime. One drop left eye once a day for glaucoma    No facility-administered encounter medications on file as of 09/13/2019.    Review of Systems:  Review of Systems  Review of Systems  Constitutional: Negative for activity change, appetite change, chills, diaphoresis, fatigue and fever.  HENT: Negative for mouth sores, postnasal drip, rhinorrhea, sinus pain and sore throat.   Respiratory: Negative for apnea, cough, chest tightness, shortness of breath and wheezing.   Cardiovascular: Negative for chest pain, palpitations and leg swelling.  Gastrointestinal: Negative for abdominal distention, abdominal pain, constipation, diarrhea, nausea and vomiting.  Genitourinary: Negative for dysuria and frequency.  Musculoskeletal: Negative for arthralgias, joint swelling and myalgias.  Skin: Negative for rash.  Neurological: Negative for dizziness, syncope, weakness, light-headedness and numbness.  Psychiatric/Behavioral: Negative for behavioral problems, confusion and sleep disturbance.     Health Maintenance  Topic Date Due  . TETANUS/TDAP  02/25/2028  . INFLUENZA VACCINE  Completed  . PNA vac Low Risk Adult  Completed    Physical Exam: Vitals:   09/13/19 1436  BP: (!) 165/90  Pulse: 60  Temp: 97.8 F (36.6 C)  SpO2: 93%  Height: 5\' 9"  (1.753 m)   Body mass index is 23.69 kg/m. Physical Exam  Constitutional: Oriented to person, place, and time. Well-developed and well-nourished.  HENT:  Head: Normocephalic. Ears Checked with No wax Mouth/Throat: Oropharynx is clear and moist.  Eyes: Pupils are equal, round, and reactive to light.  Neck: Neck supple.  Cardiovascular: Normal rate and normal heart sounds.  No murmur heard. Pulmonary/Chest: Effort normal and breath sounds normal. No respiratory distress. No wheezes. She has no rales.  Abdominal: Soft. Bowel sounds  are normal. No distension. There is no tenderness. There is no rebound.  Musculoskeletal: No edema.  Lymphadenopathy: none Neurological: Alert and oriented to person, place, and time.  Skin: Skin is warm and dry.  Psychiatric: Normal mood and affect. Behavior is normal. Thought content normal.    Labs reviewed: Basic Metabolic Panel: Recent Labs    11/23/18 0000 06/05/19 0805  NA 141 142  K 4.3 4.4  CL 107 107  CO2 29 29  GLUCOSE 85 82  BUN 20 20  CREATININE 0.85 0.94  CALCIUM 8.6 8.9   Liver Function Tests: Recent Labs    11/23/18 0000 06/05/19 0805  AST 22 21  ALT 18 20  BILITOT 1.0 1.1  PROT 6.0* 6.0*   No results for input(s): LIPASE, AMYLASE in the last 8760 hours. No results for input(s): AMMONIA in the last 8760 hours. CBC: Recent Labs    11/23/18 0000  WBC 7.8  NEUTROABS 5,015  HGB 15.2  HCT 43.4  MCV 86.6  PLT 188   Lipid Panel: Recent Labs  11/23/18 0000 06/05/19 0805  CHOL 122 118  HDL 36* 38*  LDLCALC 71 65  TRIG 69 69  CHOLHDL 3.4 3.1   No results found for: HGBA1C  Procedures since last visit: No results found.  Assessment/Plan Essential hypertension Continue to monitor with the Facility Nurse Will call our office if it comes elevated with her  Depression Feels better now Refusing to see Psychologist or Antidepressant Will call us if he cannot manage his Symptoms  His MMSE today was 30/30 Passed his Clock  Stable Issues  ? TIA/ Atypical Migraine No Recent Symptoms Does not want to see Neurology right now Continue Lipid control, BP and ASpirin Hyperlipidemia Continue on Lipitor 40 mg LDL 65 Continue Same dose  Uptodate on all vaccines including Shingrix Labs/tests ordered:  * No order type specified * Next appt:  01/11/2020

## 2019-09-14 NOTE — Addendum Note (Signed)
Addended by: Georgina Snell on: 09/14/2019 01:48 PM   Modules accepted: Orders

## 2019-09-15 DIAGNOSIS — M79671 Pain in right foot: Secondary | ICD-10-CM | POA: Diagnosis not present

## 2019-09-15 DIAGNOSIS — B351 Tinea unguium: Secondary | ICD-10-CM | POA: Diagnosis not present

## 2019-09-15 DIAGNOSIS — M79672 Pain in left foot: Secondary | ICD-10-CM | POA: Diagnosis not present

## 2019-09-18 ENCOUNTER — Other Ambulatory Visit: Payer: Self-pay | Admitting: Internal Medicine

## 2019-11-02 ENCOUNTER — Other Ambulatory Visit: Payer: Self-pay | Admitting: Internal Medicine

## 2019-11-20 DIAGNOSIS — H401131 Primary open-angle glaucoma, bilateral, mild stage: Secondary | ICD-10-CM | POA: Diagnosis not present

## 2019-11-24 DIAGNOSIS — M79672 Pain in left foot: Secondary | ICD-10-CM | POA: Diagnosis not present

## 2019-11-24 DIAGNOSIS — B351 Tinea unguium: Secondary | ICD-10-CM | POA: Diagnosis not present

## 2019-11-24 DIAGNOSIS — M79671 Pain in right foot: Secondary | ICD-10-CM | POA: Diagnosis not present

## 2019-12-05 DIAGNOSIS — D692 Other nonthrombocytopenic purpura: Secondary | ICD-10-CM | POA: Diagnosis not present

## 2019-12-05 DIAGNOSIS — D0461 Carcinoma in situ of skin of right upper limb, including shoulder: Secondary | ICD-10-CM | POA: Diagnosis not present

## 2019-12-05 DIAGNOSIS — L821 Other seborrheic keratosis: Secondary | ICD-10-CM | POA: Diagnosis not present

## 2019-12-05 DIAGNOSIS — L570 Actinic keratosis: Secondary | ICD-10-CM | POA: Diagnosis not present

## 2019-12-05 DIAGNOSIS — Z85828 Personal history of other malignant neoplasm of skin: Secondary | ICD-10-CM | POA: Diagnosis not present

## 2019-12-05 DIAGNOSIS — D044 Carcinoma in situ of skin of scalp and neck: Secondary | ICD-10-CM | POA: Diagnosis not present

## 2019-12-05 DIAGNOSIS — D1801 Hemangioma of skin and subcutaneous tissue: Secondary | ICD-10-CM | POA: Diagnosis not present

## 2019-12-06 ENCOUNTER — Encounter: Payer: Self-pay | Admitting: Internal Medicine

## 2019-12-06 ENCOUNTER — Non-Acute Institutional Stay: Payer: Medicare Other | Admitting: Internal Medicine

## 2019-12-06 ENCOUNTER — Other Ambulatory Visit: Payer: Self-pay

## 2019-12-06 VITALS — HR 68 | Temp 97.8°F

## 2019-12-06 DIAGNOSIS — R58 Hemorrhage, not elsewhere classified: Secondary | ICD-10-CM

## 2019-12-06 NOTE — Progress Notes (Signed)
Location: [place] of Service:  Clinic (12)  Provider:   Code Status:  Goals of Care:  Advanced Directives 11/30/2018  Does Patient Have a Medical Advance Directive? Yes  Type of Paramedic of [place];Out of facility DNR (pink MOST or yellow form)  Does patient want to make changes to medical advance directive? No - Patient declined  Copy of [place] in Chart? Yes - validated most recent copy scanned in chart (See row information)  Would patient like information on creating a medical advance directive? -  Pre-existing out of facility DNR order (yellow form or pink MOST form) Yellow form placed in chart (order not valid for inpatient use)     Chief Complaint  Patient presents with  . Acute Visit    Bandage change and cleaning.    HPI: Patient is a 86 y.o. male seen today for an acute visit for bleeding from skin biopsy site Patient has a history of hypertension, depression and TIA Patient had a skin biopsy done on his scalp yesterday He said that he got up last night and had blood on his pillow.  He tried to find the facility nurse but they were not able to help him that he made an appointment to come and see Russell [name] today.  Past Medical History:  Diagnosis Date  . Actinic keratosis 10/18/2012  . 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   . Atrophy, Fuchs' 12/10/2011  . 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 Cervicalgia 04/25/2013  . 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 Cyclitis, chronic 11/18/2012  . Deafness 10/18/2012  . Endothelial corneal dystrophy   . Essential hypertension 10/18/2012  . Hammer toe 04/24/2014  . Hyperlipidemia 10/18/2012  . Intermediate stage nonexudative age-related macular degeneration of both eyes 04/16/2016  . Nocturia 05/19/2016  . Occlusion and stenosis of carotid artery with cerebral infarction   . Other specified cardiac  dysrhythmias(427.89)   . Pain in joint, pelvic region and thigh   . Paresthesia 04/30/2015   Toes of both feet   . Peripheral vascular disease, unspecified ([place])   . Primary open angle glaucoma 09/25/2011  . 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  . Rosacea   . Seborrheic keratosis 04/24/2014  . Syncope 06/27/2013  . TIA (transient ischemic attack) 10/18/2012   States he has had about 5  times  . Urine frequency     Past Surgical History:  Procedure Laterality Date  . CATARACT EXTRACTION  2005   bilateral  . CORNEAL TRANSPLANT  2010 left; 2013 right eye  . HERNIA REPAIR  1985   Left  . HERNIA REPAIR  03/2004   Right  . SHOULDER DEBRIDEMENT Left 2005  . TONSILLECTOMY Bilateral childhood  . TRANSURETHRAL RESECTION OF PROSTATE N/A 10/2003  . VASECTOMY      No Known Allergies  Outpatient Encounter Medications as of 12/06/2019  Medication Sig  . aspirin 325 MG tablet Take 325 mg by mouth every evening.  Marland Kitchen atorvastatin (LIPITOR) 40 MG tablet TAKE 1 TABLET DAILY  . DORZOLAMIDE HCL-TIMOLOL MAL OP Apply to eye.  . enalapril (VASOTEC) 20 MG tablet TAKE 1 TABLET TWICE A DAY TO CONTROL BLOOD PRESSURE  . loratadine (CLARITIN) 10 MG tablet Take 10 mg by mouth daily.  . Multiple Vitamins-Minerals (PRESERVISION AREDS 2) CAPS Take 1 capsule by mouth 2 (two) times daily.  . Omega-3 Fatty Acids (FISH OIL) 1000 MG CAPS Take 1,000 mg  by mouth 2 (two) times daily.   . Travoprost, BAK Free, (TRAVATAN Z) 0.004 % SOLN ophthalmic solution Place 1 drop into the left eye at bedtime. One drop left eye once a day for glaucoma    No facility-administered encounter medications on file as of 12/06/2019.    Review of Systems:  Review of Systems  Constitutional: Negative.   HENT: Negative.   Respiratory: Negative.   Gastrointestinal: Negative.   Musculoskeletal: Negative.   Neurological: Negative for dizziness and weakness.  Psychiatric/Behavioral: Negative.   All other systems reviewed and are negative.   Health  Maintenance  Topic Date Due  . INFLUENZA VACCINE  02/18/2020  . TETANUS/TDAP  02/25/2028  . COVID-19 Vaccine  Completed  . PNA vac Low Risk Adult  Completed    Physical Exam: Vitals:   12/06/19 1550  Pulse: 68  Temp: 97.8 F (36.6 C)  SpO2: 96%   There is no height or weight on file to calculate BMI. Physical Exam Vitals reviewed.  Constitutional:      Appearance: Normal appearance.  HENT:     Head: Normocephalic.     Nose: Nose normal.     Mouth/Throat:     Mouth: Mucous membranes are moist.     Pharynx: Oropharynx is clear.  Eyes:     Pupils: Pupils are equal, round, and reactive to light.  Cardiovascular:     Rate and Rhythm: Normal rate.     Pulses: Normal pulses.  Pulmonary:     Effort: Pulmonary effort is normal.     Breath sounds: Normal breath sounds.  Abdominal:     General: Bowel sounds are normal.  Musculoskeletal:        General: No swelling.     Cervical back: Neck supple.  Skin:    Comments: Had a small spot on his scalp that the biopsy was done.  It was clean and had a small bleeding.  Neurological:     General: No focal deficit present.     Mental Status: He is alert and oriented to person, place, and time.  Psychiatric:        Mood and Affect: Mood normal.     Labs reviewed: Basic Metabolic Panel: Recent Labs    06/05/19 0805  NA 142  K 4.4  CL 107  CO2 29  GLUCOSE 82  BUN 20  CREATININE 0.94  CALCIUM 8.9   Liver Function Tests: Recent Labs    06/05/19 0805  AST 21  ALT 20  BILITOT 1.1  PROT 6.0*   No results for input(s): LIPASE, AMYLASE in the last 8760 hours. No results for input(s): AMMONIA in the last 8760 hours. CBC: No results for input(s): WBC, NEUTROABS, HGB, HCT, MCV, PLT in the last 8760 hours. Lipid Panel: Recent Labs    06/05/19 0805  CHOL 118  HDL 38*  LDLCALC 65  TRIG 69  CHOLHDL 3.1   No results found for: HGBA1C  Procedures since last visit: No results found.  Assessment/Plan Bleeding from  Skin Biopsy site Cleaned with NS Pressure was applied and cleaned. The bleeding stopped we put some topical antibiotic  Applied clean dressing Patient will continue to change the dressing as needed If it continues to bleed he needs to call his dermatologist     Labs/tests ordered:  * No order type specified * Next appt:  01/11/2020

## 2020-01-03 ENCOUNTER — Encounter: Payer: Medicare Other | Admitting: Internal Medicine

## 2020-01-09 ENCOUNTER — Other Ambulatory Visit: Payer: Self-pay

## 2020-01-09 ENCOUNTER — Non-Acute Institutional Stay: Payer: Medicare Other | Admitting: Nurse Practitioner

## 2020-01-09 ENCOUNTER — Encounter: Payer: Self-pay | Admitting: Nurse Practitioner

## 2020-01-09 DIAGNOSIS — E785 Hyperlipidemia, unspecified: Secondary | ICD-10-CM

## 2020-01-09 DIAGNOSIS — Z4889 Encounter for other specified surgical aftercare: Secondary | ICD-10-CM | POA: Diagnosis not present

## 2020-01-09 DIAGNOSIS — I10 Essential (primary) hypertension: Secondary | ICD-10-CM

## 2020-01-09 NOTE — Progress Notes (Signed)
Location:   clinic [facility]   Place of Service:  Clinic (12) Provider: Marlana Latus NP  Code Status: DNR Goals of Care:  Advanced Directives 11/30/2018  Does Patient Have a Medical Advance Directive? Yes  Type of Paramedic of [place];Out of facility DNR (pink MOST or yellow form)  Does patient want to make changes to medical advance directive? No - Patient declined  Copy of [place] in Chart? Yes - validated most recent copy scanned in chart (See row information)  Would patient like information on creating a medical advance directive? -  Pre-existing out of facility DNR order (yellow form or pink MOST form) Yellow form placed in chart (order not valid for inpatient use)     Chief Complaint  Patient presents with   Acute Visit    Head dressing    HPI: Patient is a 86 y.o. Russell [name] seen today for an acute visit for initial surgical wound care on the top of his head.    HTN, takes Enalapril 20mg  qd, ASA 325mg  qd, Atorvastatin 40mg  qd.   Hyperlipidemia, LDL at goal.   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/18/2012   Deafness 10/18/2012   Endothelial corneal dystrophy    Essential hypertension 10/18/2012   Hammer toe 04/24/2014   Hyperlipidemia 10/18/2012   Intermediate stage nonexudative age-related macular degeneration of both eyes 04/16/2016   Nocturia 05/19/2016   Occlusion and stenosis of carotid artery with cerebral infarction    Other specified cardiac dysrhythmias(427.89)    Pain in joint, pelvic region and thigh    Paresthesia 04/30/2015   Toes of both feet    Peripheral vascular disease, unspecified ([place])    Primary open angle glaucoma  09/25/2011   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   Rosacea    Seborrheic keratosis 04/24/2014   Syncope 06/27/2013   TIA (transient ischemic attack) 10/18/2012   States he has had about 5  times   Urine frequency     Past Surgical History:  Procedure Laterality Date   CATARACT EXTRACTION  2005   bilateral   CORNEAL TRANSPLANT  2010 left; 2013 right eye   HERNIA REPAIR  1985   Left   HERNIA REPAIR  03/2004   Right   SHOULDER DEBRIDEMENT Left 2005   TONSILLECTOMY Bilateral childhood   TRANSURETHRAL RESECTION OF PROSTATE N/A 10/2003   VASECTOMY      No Known Allergies  Allergies as of 01/09/2020   No Known Allergies     Medication List       Accurate as of January 09, 2020  3:Russell Russell [name]. If you have any questions, ask your nurse or doctor.        aspirin 325 MG tablet Take 325 mg by mouth every evening.   atorvastatin 40 MG tablet Commonly known as: LIPITOR TAKE 1 TABLET DAILY   DORZOLAMIDE HCL-TIMOLOL MAL OP Apply to eye.   enalapril 20 MG tablet Commonly known as: VASOTEC TAKE 1 TABLET TWICE A DAY TO CONTROL BLOOD PRESSURE   Fish Oil 1000 MG Caps Take 1,000 mg by mouth 2 (two) times daily.   loratadine 10 MG tablet Commonly known as: CLARITIN Take 10 mg by mouth daily.   PreserVision  AREDS 2 Caps Take 1 capsule by mouth 2 (two) times daily.   Travatan Z 0.004 % Soln ophthalmic solution Generic drug: Travoprost (BAK Free) Place 1 drop into the left eye at bedtime. One drop left eye once a day for glaucoma       Review of Systems:  Review of Systems  Constitutional: Negative for activity change, appetite change and fever.  HENT: Positive for hearing loss. Negative for congestion and voice change.   Eyes: Negative for visual disturbance.  Respiratory: Negative for cough, shortness of breath and wheezing.   Cardiovascular: Negative for chest pain, palpitations and leg swelling.  Musculoskeletal: Negative for gait problem.  Skin: Positive for wound.    Neurological: Negative for speech difficulty, weakness, light-headedness and headaches.  Psychiatric/Behavioral: Negative for confusion. The patient is not nervous/anxious.     Health Maintenance  Topic Date Due   INFLUENZA VACCINE  02/18/2020   TETANUS/TDAP  02/25/2028   COVID-19 Vaccine  Completed   PNA vac Low Risk Adult  Completed    Physical Exam: Vitals:   01/09/20 1404  BP: 140/88  Pulse: 60  Temp: 97.8 F (36.6 C)  SpO2: 95%   There is no height or weight on file to calculate BMI. Physical Exam Vitals reviewed.  Constitutional:      Appearance: Normal appearance.  HENT:     Head: Normocephalic and atraumatic.     Nose: Nose normal.     Mouth/Throat:     Mouth: Mucous membranes are moist.  Eyes:     Extraocular Movements: Extraocular movements intact.     Conjunctiva/sclera: Conjunctivae normal.     Pupils: Pupils are equal, round, and reactive to light.  Cardiovascular:     Rate and Rhythm: Normal rate and regular rhythm.     Heart sounds: No murmur heard.   Pulmonary:     Effort: Pulmonary effort is normal.     Breath sounds: No wheezing, rhonchi or rales.  Musculoskeletal:     Cervical back: Normal range of motion and neck supple.     Right lower leg: No edema.     Left lower leg: No edema.  Skin:    General: Skin is warm and dry.     Comments: A dime sized open wound, s/p skin lesion excision per Dermatology, small serosanguinous drainage seen, no redness, pain, or swelling  Neurological:     General: No focal deficit present.     Mental Status: He is alert and oriented to person, place, and time. Mental status is at baseline.     Gait: Gait normal.  Psychiatric:        Mood and Affect: Mood normal.        Behavior: Behavior normal.        Thought Content: Thought content normal.        Judgment: Judgment normal.     Labs reviewed: Basic Metabolic Panel: Recent Labs    06/05/19 0805  NA 142  K 4.4  CL 107  CO2 29  GLUCOSE 82  BUN  20  CREATININE 0.94  CALCIUM 8.9   Liver Function Tests: Recent Labs    06/05/19 0805  AST 21  ALT 20  BILITOT 1.1  PROT 6.0*   No results for input(s): LIPASE, AMYLASE in the last 8760 hours. No results for input(s): AMMONIA in the last 8760 hours. CBC: No results for input(s): WBC, NEUTROABS, HGB, HCT, MCV, PLT in the last 8760 hours. Lipid Panel: Recent Labs  06/05/19 0805  CHOL 118  HDL Russell*  LDLCALC 65  TRIG 69  CHOLHDL 3.1   No results found for: HGBA1C  Procedures since last visit: No results found.  Assessment/Plan Encounter for postoperative wound care A dime sized open wound, s/p skin lesion excision per Dermatology, small serosanguinous drainage seen, no redness, pain, or swelling, it should heal, needs dressing change every 2-3 days until healed, avoid submersing in water.   Essential hypertension Blood pressure is controlled, continue Enalapril 20mg  qd, ASA 325mg  qd, Atorvastatin 40mg  qd.   Hyperlipidemia Stable, LDL at goal, continue Atorvastatin.     Labs/tests ordered:  None  Next appt:  01/11/2020

## 2020-01-09 NOTE — Assessment & Plan Note (Signed)
A dime sized open wound, s/p skin lesion excision per Dermatology, small serosanguinous drainage seen, no redness, pain, or swelling, it should heal, needs dressing change every 2-3 days until healed, avoid submersing in water.

## 2020-01-09 NOTE — Assessment & Plan Note (Signed)
Stable, LDL at goal, continue Atorvastatin.

## 2020-01-09 NOTE — Assessment & Plan Note (Signed)
Blood pressure is controlled, continue Enalapril 20mg  qd, ASA 325mg  qd, Atorvastatin 40mg  qd.

## 2020-01-11 ENCOUNTER — Other Ambulatory Visit: Payer: Self-pay

## 2020-01-11 DIAGNOSIS — I10 Essential (primary) hypertension: Secondary | ICD-10-CM

## 2020-01-11 LAB — COMPLETE METABOLIC PANEL WITHOUT GFR
AG Ratio: 2.1 (calc) (ref 1.0–2.5)
ALT: 15 U/L (ref 9–46)
AST: 19 U/L (ref 10–35)
Albumin: 3.7 g/dL (ref 3.6–5.1)
Alkaline phosphatase (APISO): 48 U/L (ref 35–144)
BUN: 22 mg/dL (ref 7–25)
CO2: 28 mmol/L (ref 20–32)
Calcium: 8.5 mg/dL — ABNORMAL LOW (ref 8.6–10.3)
Chloride: 107 mmol/L (ref 98–110)
Creat: 0.87 mg/dL (ref 0.70–1.11)
GFR, Est African American: 87 mL/min/1.73m2 (ref >=60)
GFR, Est Non African American: 75 mL/min/1.73m2 (ref >=60)
Globulin: 1.8 g/dL — ABNORMAL LOW (ref 1.9–3.7)
Glucose, Bld: 86 mg/dL (ref 65–99)
Potassium: 4.1 mmol/L (ref 3.5–5.3)
Sodium: 140 mmol/L (ref 135–146)
Total Bilirubin: 0.9 mg/dL (ref 0.2–1.2)
Total Protein: 5.5 g/dL — ABNORMAL LOW (ref 6.1–8.1)

## 2020-01-11 LAB — CBC WITH DIFFERENTIAL/PLATELET
Absolute Monocytes: 730 cells/uL (ref 200–950)
Basophils Absolute: 68 cells/uL (ref 0–200)
Basophils Relative: 0.9 %
Eosinophils Absolute: 274 cells/uL (ref 15–500)
Eosinophils Relative: 3.6 %
HCT: 42.1 % (ref 38.5–50.0)
Hemoglobin: 14.6 g/dL (ref 13.2–17.1)
Lymphs Abs: 1482 cells/uL (ref 850–3900)
MCH: 30.5 pg (ref 27.0–33.0)
MCHC: 34.7 g/dL (ref 32.0–36.0)
MCV: 87.9 fL (ref 80.0–100.0)
MPV: 10.3 fL (ref 7.5–12.5)
Monocytes Relative: 9.6 %
Neutro Abs: 5046 cells/uL (ref 1500–7800)
Neutrophils Relative %: 66.4 %
Platelets: 179 10*3/uL (ref 140–400)
RBC: 4.79 10*6/uL (ref 4.20–5.80)
RDW: 13 % (ref 11.0–15.0)
Total Lymphocyte: 19.5 %
WBC: 7.6 10*3/uL (ref 3.8–10.8)

## 2020-01-16 ENCOUNTER — Other Ambulatory Visit: Payer: Self-pay

## 2020-01-16 ENCOUNTER — Encounter: Payer: Self-pay | Admitting: Family

## 2020-01-16 ENCOUNTER — Ambulatory Visit (INDEPENDENT_AMBULATORY_CARE_PROVIDER_SITE_OTHER): Payer: Medicare Other | Admitting: Family

## 2020-01-16 VITALS — BP 144/92 | HR 62 | Temp 96.6°F | Ht 69.0 in | Wt 163.0 lb

## 2020-01-16 DIAGNOSIS — Z9889 Other specified postprocedural states: Secondary | ICD-10-CM

## 2020-01-16 DIAGNOSIS — I10 Essential (primary) hypertension: Secondary | ICD-10-CM

## 2020-01-16 NOTE — Progress Notes (Addendum)
Provider: Newell Wafer FNP-C  Virgie Dad, MD  Patient Care Team: Virgie Dad, MD as PCP - General (Internal Medicine) [place], [facility] Crista Luria, MD as Consulting Physician (Dermatology) Marilynne Halsted, MD as Referring Physician (Ophthalmology) Bond, Tracie Harrier, MD as Referring Physician (Ophthalmology) Virgie Dad, MD as Referring Physician (Internal Medicine)  Extended Emergency Contact Information Primary Emergency Contact: Osvaldo Human of [place] Phone: [phone] Relation: Son  Code Status:  DNR Goals of care: Advanced Directive information Advanced Directives 11/30/2018  Does Patient Have a Medical Advance Directive? Yes  Type of Paramedic of [place];Out of facility DNR (pink MOST or yellow form)  Does patient want to make changes to medical advance directive? No - Patient declined  Copy of [place] in Chart? Yes - validated most recent copy scanned in chart (See row information)  Would patient like information on creating a medical advance directive? -  Pre-existing out of facility DNR order (yellow form or pink MOST form) Yellow form placed in chart (order not valid for inpatient use)     Chief Complaint  Patient presents with  . Acute Visit    Examine area of concern on top of head     HPI:  Pt is a 86 y.o. male seen today for an acute visit for evaluation area of concern on top of head.He resides at [facility] usually follows up with Dr.Gupta at the facility.States spot on the head is painful.Status post biopsy [id number] then second biopsy around 12/27/2019.He is concerned area is not healing.He has been cleaning with soap, water ,drying with paper towel ,applying Vaseline and covering with a band Aid.He has instructions from the dermatologist.No fever,chills or drainage. His blood pressure 144/92 today.He brought his B/p Log from home B/p readings  ranging in 120's/60's - 140's/60's with few readings in the 150's/70's.He denies any headache,dizziness,fatigue,chest pain or shortness of breath.On enalapril 20 mg tablet daily.  Past Medical History:  Diagnosis Date  . Actinic keratosis 10/18/2012  . 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   . Atrophy, Fuchs' 12/10/2011  . 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 Cervicalgia 04/25/2013  . 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 Cyclitis, chronic 11/18/2012  . Deafness 10/18/2012  . Endothelial corneal dystrophy   . Essential hypertension 10/18/2012  . Hammer toe 04/24/2014  . Hyperlipidemia 10/18/2012  . Intermediate stage nonexudative age-related macular degeneration of both eyes 04/16/2016  . Nocturia 05/19/2016  . Occlusion and stenosis of carotid artery with cerebral infarction   . Other specified cardiac dysrhythmias(427.89)   . Pain in joint, pelvic region and thigh   . Paresthesia 04/30/2015   Toes of both feet   . Peripheral vascular disease, unspecified ([place])   . Primary open angle glaucoma 09/25/2011  . 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  . Rosacea   . Seborrheic keratosis 04/24/2014  . Syncope 06/27/2013  . TIA (transient ischemic attack) 10/18/2012   States he has had about 5  times  . Urine frequency    Past Surgical History:  Procedure Laterality Date  . CATARACT EXTRACTION  2005   bilateral  . CORNEAL TRANSPLANT  2010 left; 2013 right eye  . HERNIA REPAIR  1985   Left  . HERNIA REPAIR  03/2004   Right  . SHOULDER DEBRIDEMENT Left 2005  . TONSILLECTOMY Bilateral childhood  . TRANSURETHRAL RESECTION OF PROSTATE N/A 10/2003  . VASECTOMY  No Known Allergies  Outpatient Encounter Medications as of 01/16/2020  Medication Sig  . aspirin 325 MG tablet Take 325 mg by mouth every evening.  Marland Kitchen atorvastatin (LIPITOR) 40 MG tablet TAKE 1 TABLET DAILY  . DORZOLAMIDE HCL-TIMOLOL MAL OP Apply to eye.  . enalapril (VASOTEC) 20 MG tablet  TAKE 1 TABLET TWICE A DAY TO CONTROL BLOOD PRESSURE  . loratadine (CLARITIN) 10 MG tablet Take 10 mg by mouth daily.  . Multiple Vitamins-Minerals (PRESERVISION AREDS 2) CAPS Take 1 capsule by mouth 2 (two) times daily.  . Omega-3 Fatty Acids (FISH OIL) 1000 MG CAPS Take 1,000 mg by mouth 2 (two) times daily.   . [DISCONTINUED] Travoprost, BAK Free, (TRAVATAN Z) 0.004 % SOLN ophthalmic solution Place 1 drop into the left eye at bedtime. One drop left eye once a day for glaucoma    No facility-administered encounter medications on file as of 01/16/2020.    Review of Systems  Constitutional: Negative for appetite change, chills, fatigue and fever.  HENT: Negative for congestion, rhinorrhea, sinus pressure, sinus pain, sneezing and sore throat.   Eyes: Negative for discharge, redness and itching.  Respiratory: Negative for cough, chest tightness, shortness of breath and wheezing.   Cardiovascular: Negative for chest pain, palpitations and leg swelling.  Gastrointestinal: Negative for abdominal distention, constipation, diarrhea, nausea and vomiting.  Musculoskeletal: Negative for arthralgias, gait problem, joint swelling and myalgias.  Skin: Positive for wound. Negative for color change, pallor and rash.       Status post biopsy on top of the head twice per HPI   Neurological: Negative for dizziness, speech difficulty, weakness, light-headedness and headaches.  Psychiatric/Behavioral: Negative for agitation, confusion and sleep disturbance. The patient is not nervous/anxious.     Immunization History  Administered Date(s) Administered  . Influenza Whole 04/19/2012, 04/20/2013  . Influenza, High Dose Seasonal PF 05/03/2019  . Influenza,inj,Quad PF,6+ Mos 04/21/2018  . Influenza-Unspecified 05/03/2014, 04/18/2015, 04/30/2016, 04/18/2017  . Moderna SARS-COVID-2 Vaccination 07/24/2019, 08/21/2019  . Pneumococcal Conjugate-13 03/20/2010  . Pneumococcal Polysaccharide-23 11/02/2017  . Td  02/24/2018  . Tdap 06/27/2011  . Zoster Recombinat (Shingrix) 12/24/2017, 03/09/2018   Pertinent  Health Maintenance Due  Topic Date Due  . INFLUENZA VACCINE  02/18/2020  . PNA vac Low Risk Adult  Completed   Fall Risk  09/13/2019 06/07/2019 11/30/2018 07/27/2018 06/29/2018  Falls in the past year? 0 0 0 0 0  Number falls in past yr: 0 0 1 0 0  Injury with Fall? - - 0 0 0    Vitals:   01/16/20 1610  BP: (!) 144/92  Pulse: 62  Temp: (!) 96.6 F (35.9 C)  TempSrc: Temporal  SpO2: 97%  Weight: 163 lb (73.9 kg)  Height: 5\' 9"  (1.753 m)   Body mass index is 24.07 kg/m. Physical Exam Vitals reviewed.  Constitutional:      General: He is not in acute distress.    Appearance: He is normal weight. He is not ill-appearing.  HENT:     Head: Normocephalic.     Mouth/Throat:     Mouth: Mucous membranes are moist.     Pharynx: Oropharynx is clear. No oropharyngeal exudate or posterior oropharyngeal erythema.  Eyes:     General: No scleral icterus.       Right eye: No discharge.        Left eye: No discharge.     Conjunctiva/sclera: Conjunctivae normal.     Pupils: Pupils are equal, round, and reactive to light.  Neck:     Vascular: No carotid bruit.  Cardiovascular:     Rate and Rhythm: Normal rate and regular rhythm.     Pulses: Normal pulses.     Heart sounds: Normal heart sounds. No murmur heard.  No friction rub. No gallop.   Pulmonary:     Effort: Pulmonary effort is normal. No respiratory distress.     Breath sounds: Normal breath sounds. No wheezing, rhonchi or rales.  Chest:     Chest wall: No tenderness.  Abdominal:     General: Bowel sounds are normal. There is no distension.     Palpations: Abdomen is soft. There is no mass.     Tenderness: There is no abdominal tenderness. There is no right CVA tenderness, left CVA tenderness, guarding or rebound.  Musculoskeletal:     Cervical back: Normal range of motion. No rigidity or tenderness.  Lymphadenopathy:      Cervical: No cervical adenopathy.  Skin:    General: Skin is warm and dry.     Coloration: Skin is not pale.     Findings: No bruising, erythema or rash.     Comments: Dime size Biopsy site at the top of the head wound bed red without any drainage noted.surrounding skin tissue without any swelling,redness and no tenderness to touch,site cleanse with sterile wipe,pat dry,triple antibiotic ointment applied to wound bed and covered with band aid.   Neurological:     Mental Status: He is alert and oriented to person, place, and time.     Cranial Nerves: No cranial nerve deficit.     Sensory: No sensory deficit.     Motor: No weakness.     Gait: Gait normal.  Psychiatric:        Mood and Affect: Mood normal.        Behavior: Behavior normal.        Thought Content: Thought content normal.        Judgment: Judgment normal.    Labs reviewed: Recent Labs    06/05/19 0805 01/10/20 0821  NA 142 140  K 4.4 4.1  CL 107 107  CO2 29 28  GLUCOSE 82 86  BUN 20 22  CREATININE 0.94 0.87  CALCIUM 8.9 8.5*   Recent Labs    06/05/19 0805 01/10/20 0821  AST 21 19  ALT 20 15  BILITOT 1.1 0.9  PROT 6.0* 5.5*   Recent Labs    01/10/20 0821  WBC 7.6  NEUTROABS 5,046  HGB 14.6  HCT 42.1  MCV 87.9  PLT 179   Lab Results  Component Value Date   TSH 2.52 04/26/2017   No results found for: HGBA1C Lab Results  Component Value Date   CHOL 118 06/05/2019   HDL 38 (L) 06/05/2019   LDLCALC 65 06/05/2019   TRIG 69 06/05/2019   CHOLHDL 3.1 06/05/2019    Significant Diagnostic Results in last 30 days:  No results found.  Assessment/Plan 1. Status post biopsy of skin Afebrile.Biopsy site at the top of the head wound bed red without any drainage noted.surrounding skin tissue without any swelling,redness and no tenderness to touch,site cleanse with sterile wipe,pat dry,triple antibiotic ointment applied to wound bed and covered with band aid.  - progressive healing.Advised to continue  to wound care as directed by dermatologist. - Advised to  Notify provider for any yellow drainage or wound fails to heal.   2. Essential hypertension B/p not at goal this visit.Home readings reviewed most readings at goal.will continue  on enalapril 20 mg tablet daily.Has upcoming appointment with Dr.Gupta tomorrow to recheck B/p.   Family/ staff Communication: Reviewed plan of care with patient verbalized understanding.   Labs/tests ordered: None   Next Appointment: Has appointment 01/17/2020 with Dr.Gupta.  Sandrea Hughs, NP

## 2020-01-16 NOTE — Patient Instructions (Signed)
-   continue to clean top of the head biopsy site with warm water and antibacterial soap,pat dry,apply small amount of Vaseline and cover with Band aid  as directed by dermatologist.change band aid daily.  - Notify provider if you notice any yellow drainage or wound fails to heal.

## 2020-01-17 ENCOUNTER — Non-Acute Institutional Stay: Payer: Medicare Other | Admitting: Internal Medicine

## 2020-01-17 ENCOUNTER — Encounter: Payer: Self-pay | Admitting: Internal Medicine

## 2020-01-17 VITALS — BP 184/86 | HR 62 | Temp 97.3°F | Ht 69.0 in | Wt 164.6 lb

## 2020-01-17 DIAGNOSIS — E7800 Pure hypercholesterolemia, unspecified: Secondary | ICD-10-CM | POA: Diagnosis not present

## 2020-01-17 DIAGNOSIS — I10 Essential (primary) hypertension: Secondary | ICD-10-CM

## 2020-01-17 DIAGNOSIS — E785 Hyperlipidemia, unspecified: Secondary | ICD-10-CM | POA: Diagnosis not present

## 2020-01-17 DIAGNOSIS — G459 Transient cerebral ischemic attack, unspecified: Secondary | ICD-10-CM

## 2020-01-17 DIAGNOSIS — F320 Major depressive disorder, single episode, mild: Secondary | ICD-10-CM

## 2020-01-17 DIAGNOSIS — Z9889 Other specified postprocedural states: Secondary | ICD-10-CM | POA: Diagnosis not present

## 2020-01-17 MED ORDER — AMLODIPINE BESYLATE 2.5 MG PO TABS: 2.5000 mg | ORAL_TABLET | Freq: Every day | ORAL | 3 refills | Status: DC

## 2020-01-17 MED ORDER — AMLODIPINE BESYLATE 2.5 MG PO TABS: 2.5000 mg | ORAL_TABLET | Freq: Every day | ORAL | 1 refills | Status: DC

## 2020-01-17 NOTE — Progress Notes (Signed)
Location:  [facility] of Service:  Clinic (12)  Provider:   Code Status:  Goals of Care:  Advanced Directives 11/30/2018  Does Patient Have a Medical Advance Directive? Yes  Type of Paramedic of [place];Out of facility DNR (pink MOST or yellow form)  Does patient want to make changes to medical advance directive? No - Patient declined  Copy of [place] in Chart? Yes - validated most recent copy scanned in chart (See row information)  Would patient like information on creating a medical advance directive? -  Pre-existing out of facility DNR order (yellow form or pink MOST form) Yellow form placed in chart (order not valid for inpatient use)     Chief Complaint  Patient presents with  . Medical Management of Chronic Issues    Patient returns to the clinic to follow up. He seen his dermatologist again on the 9th. He complains his arch may need somephysical therapy. He does not see a podiatrist.     HPI: Patient is a 86 y.o. male seen today for medical management of chronic diseases.   Hypertension BP running high even by facility Nurse. Has gained some weight Nit having any symptoms  Nonhealing wound on his scalp Patient had skin biopsy done twice on his scalp The last was on 9 June  It has still not healed and he is applying Vaseline to it and trying to keep it clean.  Denies any pain or any discharge Has to ask his friend to help  Depression His mood is much better since facility is open Still gets upset about his daughter who died in Car crash  Cognition C/o Problem finding words sometimes. And forgets names. But over all doing well with Reading Word puzzles and Driving  History of TIA versus migraine Had detailed work-upbyneurology for his atypical symptoms of visual impairment. With Aphasia and Word finding.He has had detail work ups including EEG, Echo and MRI .which have been Negative. They have  recommended Asprin, BP control and Statin. No Episodes recently  Walks with no assist. Independent in ADLS and IADLS Driving No Falls POA son in [place]  Past Medical History:  Diagnosis Date  . Actinic keratosis 10/18/2012  . 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   . Atrophy, Fuchs' 12/10/2011  . 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 Cervicalgia 04/25/2013  . 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 Cyclitis, chronic 11/18/2012  . Deafness 10/18/2012  . Endothelial corneal dystrophy   . Essential hypertension 10/18/2012  . Hammer toe 04/24/2014  . Hyperlipidemia 10/18/2012  . Intermediate stage nonexudative age-related macular degeneration of both eyes 04/16/2016  . Nocturia 05/19/2016  . Occlusion and stenosis of carotid artery with cerebral infarction   . Other specified cardiac dysrhythmias(427.89)   . Pain in joint, pelvic region and thigh   . Paresthesia 04/30/2015   Toes of both feet   . Peripheral vascular disease, unspecified ([place])   . Primary open angle glaucoma 09/25/2011  . 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  . Rosacea   . Seborrheic keratosis 04/24/2014  . Syncope 06/27/2013  . TIA (transient ischemic attack) 10/18/2012   States he has had about 5  times  . Urine frequency     Past Surgical History:  Procedure Laterality Date  . CATARACT EXTRACTION  2005   bilateral  . CORNEAL TRANSPLANT  2010 left; 2013 right eye  . [place]  Left  . HERNIA REPAIR  03/2004   Right  . SHOULDER DEBRIDEMENT Left 2005  . TONSILLECTOMY Bilateral childhood  . TRANSURETHRAL RESECTION OF PROSTATE N/A 10/2003  . VASECTOMY      No Known Allergies  Outpatient Encounter Medications as of 01/17/2020  Medication Sig  . aspirin 325 MG tablet Take 325 mg by mouth every evening.  Marland Kitchen atorvastatin (LIPITOR) 40 MG tablet TAKE 1 TABLET DAILY  . DORZOLAMIDE HCL-TIMOLOL MAL OP Apply to eye.  . enalapril (VASOTEC) 20 MG tablet TAKE 1 TABLET TWICE  A DAY TO CONTROL BLOOD PRESSURE  . loratadine (CLARITIN) 10 MG tablet Take 10 mg by mouth daily.  . Multiple Vitamins-Minerals (PRESERVISION AREDS 2) CAPS Take 1 capsule by mouth 2 (two) times daily.  . Omega-3 Fatty Acids (FISH OIL) 1000 MG CAPS Take 1,000 mg by mouth 2 (two) times daily.   Marland Kitchen amLODipine (NORVASC) 2.5 MG tablet Take 1 tablet (2.5 mg total) by mouth daily.   No facility-administered encounter medications on file as of 01/17/2020.    Review of Systems:  Review of Systems  Review of Systems  Constitutional: Negative for activity change, appetite change, chills, diaphoresis, fatigue and fever.  HENT: Negative for mouth sores, postnasal drip, rhinorrhea, sinus pain and sore throat.   Respiratory: Negative for apnea, cough, chest tightness, shortness of breath and wheezing.   Cardiovascular: Negative for chest pain, palpitations and leg swelling.  Gastrointestinal: Negative for abdominal distention, abdominal pain, constipation, diarrhea, nausea and vomiting.  Genitourinary: Negative for dysuria and frequency.  Musculoskeletal: Negative for arthralgias, joint swelling and myalgias.  Skin: Negative for rash.  Neurological: Negative for dizziness, syncope, weakness, light-headedness and numbness.  Psychiatric/Behavioral: Negative for behavioral problems, confusion and sleep disturbance.     Health Maintenance  Topic Date Due  . INFLUENZA VACCINE  02/18/2020  . TETANUS/TDAP  02/25/2028  . COVID-19 Vaccine  Completed  . PNA vac Low Risk Adult  Completed    Physical Exam: Vitals:   01/17/20 1408  BP: (!) 184/86 Checked Manually  Pulse: 62  Temp: (!) 97.3 F (36.3 C)  SpO2: 95%  Weight: 164 lb 9.6 oz (74.7 kg)  Height: 5\' 9"  (1.753 m)   Body mass index is 24.31 kg/m. Physical Exam  Constitutional: Oriented to person, place, and time. Well-developed and well-nourished.  HENT:  Head: Normocephalic. Has small Wound Superficial wound on top of his scalp. No redness or  discharge. Margins and base is clean His Face is flushed Mouth/Throat: Oropharynx is clear and moist.  Eyes: Pupils are equal, round, and reactive to light.  Neck: Neck supple.  Cardiovascular: Normal rate and normal heart sounds.  No murmur heard. Pulmonary/Chest: Effort normal and breath sounds normal. No respiratory distress. No wheezes. She has no rales.  Abdominal: Soft. Bowel sounds are normal. No distension. There is no tenderness. There is no rebound.  Musculoskeletal: No edema.  Lymphadenopathy: none Neurological: Alert and oriented to person, place, and time.  Skin: Skin is warm and dry.  Psychiatric: Normal mood and affect. Behavior is normal. Thought content normal.    Labs reviewed: Basic Metabolic Panel: Recent Labs    06/05/19 0805 01/10/20 0821  NA 142 140  K 4.4 4.1  CL 107 107  CO2 29 28  GLUCOSE 82 86  BUN 20 22  CREATININE 0.94 0.87  CALCIUM 8.9 8.5*   Liver Function Tests: Recent Labs    06/05/19 0805 01/10/20 0821  AST 21 19  ALT 20 15  BILITOT  1.1 0.9  PROT 6.0* 5.5*   No results for input(s): LIPASE, AMYLASE in the last 8760 hours. No results for input(s): AMMONIA in the last 8760 hours. CBC: Recent Labs    01/10/20 0821  WBC 7.6  NEUTROABS 5,046  HGB 14.6  HCT 42.1  MCV 87.9  PLT 179   Lipid Panel: Recent Labs    06/05/19 0805  CHOL 118  HDL 38*  LDLCALC 65  TRIG 69  CHOLHDL 3.1   No results found for: HGBA1C  Procedures since last visit: No results found.  Assessment/Plan Status post biopsy of skin Non Healing Biopsy Site Not infected but has not healed yet Biopsy date was 12/27/19 D/w the patient to call Dermatology if not better in 2 weeks  Essential hypertension BP not controlled  Will start on Amlodipine 2.5 mg QD Recheck BP in 8 weeks  Hyperlipidemia, unspecified hyperlipidemia type LDL at goal on Lipitor Depression, major, single episode, mild (HCC) Doing well Not on any Meds TIA (transient ischemic  attack) On Aspirin and Statin  Cognition Issues Still doing very well MMSE in 2/21 was 30/30 Passed Clock Drawing   Labs/tests ordered:  * No order type specified * Next appt:  03/13/2020   Total time spent in this patient care encounter was 45 _  minutes; greater than 50% of the visit spent counseling patient and staff, reviewing records , Labs and coordinating care for problems addressed at this encounter.

## 2020-02-02 DIAGNOSIS — B351 Tinea unguium: Secondary | ICD-10-CM | POA: Diagnosis not present

## 2020-02-02 DIAGNOSIS — M79671 Pain in right foot: Secondary | ICD-10-CM | POA: Diagnosis not present

## 2020-02-02 DIAGNOSIS — M79672 Pain in left foot: Secondary | ICD-10-CM | POA: Diagnosis not present

## 2020-02-21 DIAGNOSIS — H401131 Primary open-angle glaucoma, bilateral, mild stage: Secondary | ICD-10-CM | POA: Diagnosis not present

## 2020-03-13 ENCOUNTER — Other Ambulatory Visit: Payer: Self-pay

## 2020-03-13 ENCOUNTER — Encounter: Payer: Self-pay | Admitting: Internal Medicine

## 2020-03-13 ENCOUNTER — Non-Acute Institutional Stay: Payer: Medicare Other | Admitting: Internal Medicine

## 2020-03-13 VITALS — BP 184/70 | HR 69 | Temp 97.3°F | Ht 69.0 in | Wt 165.8 lb

## 2020-03-13 DIAGNOSIS — G459 Transient cerebral ischemic attack, unspecified: Secondary | ICD-10-CM | POA: Diagnosis not present

## 2020-03-13 DIAGNOSIS — I10 Essential (primary) hypertension: Secondary | ICD-10-CM | POA: Diagnosis not present

## 2020-03-13 DIAGNOSIS — E785 Hyperlipidemia, unspecified: Secondary | ICD-10-CM

## 2020-03-13 DIAGNOSIS — F320 Major depressive disorder, single episode, mild: Secondary | ICD-10-CM | POA: Diagnosis not present

## 2020-03-13 MED ORDER — AMLODIPINE BESYLATE 5 MG PO TABS: 5.0000 mg | ORAL_TABLET | Freq: Every day | ORAL | 3 refills | Status: DC

## 2020-03-13 NOTE — Progress Notes (Signed)
Location: [facility] of Service:  Clinic (12)  Provider:   Code Status:  Goals of Care:  Advanced Directives 11/30/2018  Does Patient Have a Medical Advance Directive? Yes  Type of Paramedic of [place];Out of facility DNR (pink MOST or yellow form)  Does patient want to make changes to medical advance directive? No - Patient declined  Copy of [place] in Chart? Yes - validated most recent copy scanned in chart (See row information)  Would patient like information on creating a medical advance directive? -  Pre-existing out of facility DNR order (yellow form or pink MOST form) Yellow form placed in chart (order not valid for inpatient use)     Chief Complaint  Patient presents with  . Medical Management of Chronic Issues    HPI: Patient is a 86 y.o. male seen today for an acute visit for Follow up of his BP And other issues  Hypertension BP still high but better when checked by Facility Nurse Continues to be Asymptomatic S/P Skin Biopsy for cancer Healed now Depression Mood Much improved  Cognition C/o Problem finding words sometimes. And forgets names. But over all doing well with Reading Word puzzles and Driving  History of TIA versus migraine Had detailed work-upbyneurology for his atypical symptoms of visual impairment. With Aphasia and Word finding.He has had detail work ups including EEG, Echo and MRI .which have been Negative. They have recommended Asprin, BP control and Statin. No Episodes recently  Walks with no assist. Independent in ADLS and IADLS Driving No Falls POA son in [place]  Past Medical History:  Diagnosis Date  . Actinic keratosis 10/18/2012  . 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   . Atrophy, Fuchs' 12/10/2011  . 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 Cervicalgia 04/25/2013  . 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  Cyclitis, chronic 11/18/2012  . Deafness 10/18/2012  . Endothelial corneal dystrophy   . Essential hypertension 10/18/2012  . Hammer toe 04/24/2014  . Hyperlipidemia 10/18/2012  . Intermediate stage nonexudative age-related macular degeneration of both eyes 04/16/2016  . Nocturia 05/19/2016  . Occlusion and stenosis of carotid artery with cerebral infarction   . Other specified cardiac dysrhythmias(427.89)   . Pain in joint, pelvic region and thigh   . Paresthesia 04/30/2015   Toes of both feet   . Peripheral vascular disease, unspecified ([place])   . Primary open angle glaucoma 09/25/2011  . 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  . Rosacea   . Seborrheic keratosis 04/24/2014  . Syncope 06/27/2013  . TIA (transient ischemic attack) 10/18/2012   States he has had about 5  times  . Urine frequency     Past Surgical History:  Procedure Laterality Date  . CATARACT EXTRACTION  2005   bilateral  . CORNEAL TRANSPLANT  2010 left; 2013 right eye  . HERNIA REPAIR  1985   Left  . HERNIA REPAIR  03/2004   Right  . SHOULDER DEBRIDEMENT Left 2005  . TONSILLECTOMY Bilateral childhood  . TRANSURETHRAL RESECTION OF PROSTATE N/A 10/2003  . VASECTOMY      No Known Allergies  Outpatient Encounter Medications as of 03/13/2020  Medication Sig  . aspirin 325 MG tablet Take 325 mg by mouth every evening.  Marland Kitchen atorvastatin (LIPITOR) 40 MG tablet TAKE 1 TABLET DAILY  . DORZOLAMIDE HCL-TIMOLOL MAL OP Apply to eye.  . enalapril (VASOTEC) 20 MG tablet TAKE 1 TABLET  TWICE A DAY TO CONTROL BLOOD PRESSURE  . loratadine (CLARITIN) 10 MG tablet Take 10 mg by mouth daily.  . Multiple Vitamins-Minerals (PRESERVISION AREDS 2) CAPS Take 1 capsule by mouth 2 (two) times daily.  . Omega-3 Fatty Acids (FISH OIL) 1000 MG CAPS Take 1,000 mg by mouth 2 (two) times daily.   . [DISCONTINUED] amLODipine (NORVASC) 2.5 MG tablet Take 1 tablet (2.5 mg total) by mouth daily.   No facility-administered encounter medications on file as of 03/13/2020.     Review of Systems:  Review of Systems  Review of Systems  Constitutional: Negative for activity change, appetite change, chills, diaphoresis, fatigue and fever.  HENT: Negative for mouth sores, postnasal drip, rhinorrhea, sinus pain and sore throat.   Respiratory: Negative for apnea, cough, chest tightness, shortness of breath and wheezing.   Cardiovascular: Negative for chest pain, palpitations and leg swelling.  Gastrointestinal: Negative for abdominal distention, abdominal pain, constipation, diarrhea, nausea and vomiting.  Genitourinary: Negative for dysuria and frequency.  Musculoskeletal: Negative for arthralgias, joint swelling and myalgias.  Skin: Negative for rash.  Neurological: Negative for dizziness, syncope, weakness, light-headedness and numbness.  Psychiatric/Behavioral: Negative for behavioral problems, confusion and sleep disturbance.     Health Maintenance  Topic Date Due  . INFLUENZA VACCINE  02/18/2020  . TETANUS/TDAP  02/25/2028  . COVID-19 Vaccine  Completed  . PNA vac Low Risk Adult  Completed    Physical Exam: Vitals:   03/13/20 1406  BP: (!) 184/70  Pulse: 69  Temp: (!) 97.3 F (36.3 C)  SpO2: 97%  Weight: 165 lb 12.8 oz (75.2 kg)  Height: 5\' 9"  (1.753 m)   Body mass index is 24.48 kg/m. Physical Exam  Constitutional: Oriented to person, place, and time. Well-developed and well-nourished.  HENT:  Head: Normocephalic.  Mouth/Throat: Oropharynx is clear and moist.  Eyes: Pupils are equal, round, and reactive to light.  Neck: Neck supple.  Cardiovascular: Normal rate and normal heart sounds.  No murmur heard. Pulmonary/Chest: Effort normal and breath sounds normal. No respiratory distress. No wheezes. She has no rales.  Abdominal: Soft. Bowel sounds are normal. No distension. There is no tenderness. There is no rebound.  Musculoskeletal: No edema.  Lymphadenopathy: none Neurological: Alert and oriented to person, place, and time.  Skin:  Skin is warm and dry.  Psychiatric: Normal mood and affect. Behavior is normal. Thought content normal.    Labs reviewed: Basic Metabolic Panel: Recent Labs    06/05/19 0805 01/10/20 0821  NA 142 140  K 4.4 4.1  CL 107 107  CO2 29 28  GLUCOSE 82 86  BUN 20 22  CREATININE 0.94 0.87  CALCIUM 8.9 8.5*   Liver Function Tests: Recent Labs    06/05/19 0805 01/10/20 0821  AST 21 19  ALT 20 15  BILITOT 1.1 0.9  PROT 6.0* 5.5*   No results for input(s): LIPASE, AMYLASE in the last 8760 hours. No results for input(s): AMMONIA in the last 8760 hours. CBC: Recent Labs    01/10/20 0821  WBC 7.6  NEUTROABS 5,046  HGB 14.6  HCT 42.1  MCV 87.9  PLT 179   Lipid Panel: Recent Labs    06/05/19 0805  CHOL 118  HDL 38*  LDLCALC 65  TRIG 69  CHOLHDL 3.1   No results found for: HGBA1C  Procedures since last visit: No results found.  Assessment/Plan Essential hypertension Increased Norvasc to 5 mg On Vasotec Also Hyperlipidemia, unspecified hyperlipidemia type On Lipitor TIA (transient  ischemic attack) Aspirin, Statin and BP control Depression, major, single episode, mild (HCC) No Meds Mood stable  Cognition Issues Still doing very well MMSE in 2/21 was 30/30 Passed Clock Drawing  Labs/tests ordered:  * No order type specified * Next appt:  Visit date not found

## 2020-03-26 ENCOUNTER — Other Ambulatory Visit: Payer: Self-pay

## 2020-03-26 ENCOUNTER — Telehealth: Payer: Self-pay

## 2020-03-26 MED ORDER — AMLODIPINE BESYLATE 5 MG PO TABS
5.0000 mg | ORAL_TABLET | Freq: Every day | ORAL | 3 refills | Status: DC
Start: 2020-03-26 — End: 2021-02-12

## 2020-03-26 NOTE — Telephone Encounter (Signed)
Patient called to ask for refill on his amlodipine 5 mg as he was sent a refill request from [facility]

## 2020-04-09 DIAGNOSIS — Z23 Encounter for immunization: Secondary | ICD-10-CM | POA: Diagnosis not present

## 2020-04-12 DIAGNOSIS — B351 Tinea unguium: Secondary | ICD-10-CM | POA: Diagnosis not present

## 2020-04-12 DIAGNOSIS — M79671 Pain in right foot: Secondary | ICD-10-CM | POA: Diagnosis not present

## 2020-04-12 DIAGNOSIS — M79672 Pain in left foot: Secondary | ICD-10-CM | POA: Diagnosis not present

## 2020-04-26 ENCOUNTER — Other Ambulatory Visit: Payer: Self-pay | Admitting: Internal Medicine

## 2020-04-26 ENCOUNTER — Telehealth: Payer: Self-pay | Admitting: Internal Medicine

## 2020-04-26 NOTE — Telephone Encounter (Signed)
Patient last had lipids 11/20. To continue to fill his statin we need to at least get a lipid panel.  He does have a F/U scheduled for 10/27 for his BP. Do you want me to schedule labs before that appointment or schedule him for a physical? His last documented physical was 05/19/16.

## 2020-04-30 NOTE — Telephone Encounter (Signed)
Yes Sure that would be good.

## 2020-05-04 IMAGING — MR MR MRA HEAD W/O CM
10 of 12 series · 30 of 48 positions shown · non-contrast
Comparison: Prior CT and CTA from earlier the same day.

CLINICAL DATA: Initial evaluation for acute visual changes,
word-finding difficulty.

EXAM:
MRI HEAD WITHOUT CONTRAST
MRA HEAD WITHOUT CONTRAST
TECHNIQUE: Multiplanar, multiecho pulse sequences of the brain and surrounding
structures were obtained without intravenous contrast. Angiographic
images of the head were obtained using MRA technique without
contrast.

[Series 5: DWI · axial · 4.0mm · 0.88mm/px · z∈[+17,+152]mm · 5 of 70 slices shown (1 of 4)]
[im 1/70]
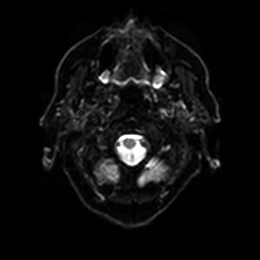
[im 18/70]
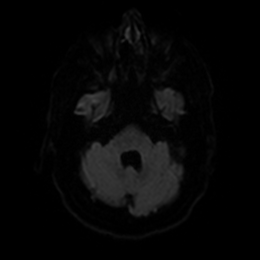
[im 35/70]
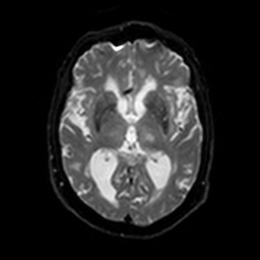
[im 52/70]
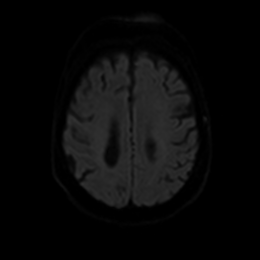
[im 70/70]
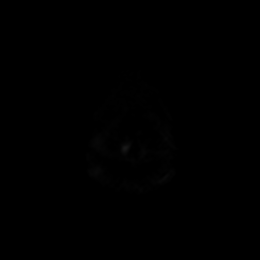

[Series 6: DWI · axial · 4.0mm · 0.88mm/px · z∈[+17,+152]mm · 2 of 35 slices shown (2 of 4)]
[im 1/35]
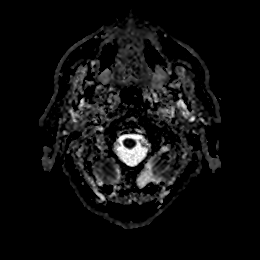
[im 35/35]
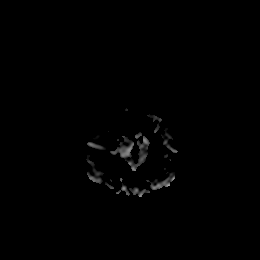

[Series 7: DWI · coronal · 4.0mm · 0.88mm/px · 5 of 70 slices shown (3 of 4)]
[im 1/70]
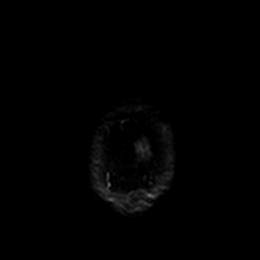
[im 18/70]
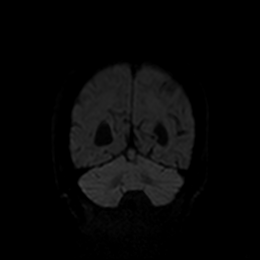
[im 35/70]
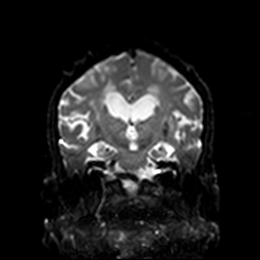
[im 52/70]
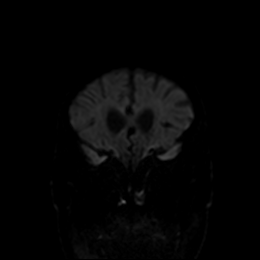
[im 70/70]
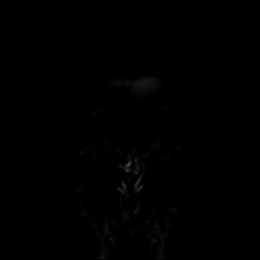

[Series 8: DWI · coronal · 4.0mm · 0.88mm/px · 2 of 35 slices shown (4 of 4)]
[im 1/35]
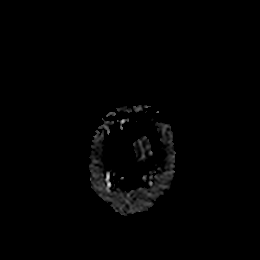
[im 35/35]
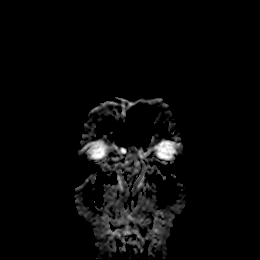

[Series 9: T1 · sagittal · 5.0mm · 0.75mm/px · 2 of 23 slices shown]
[im 1/23]
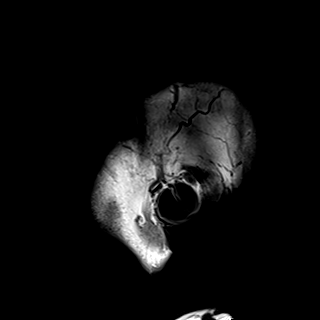
[im 23/23]
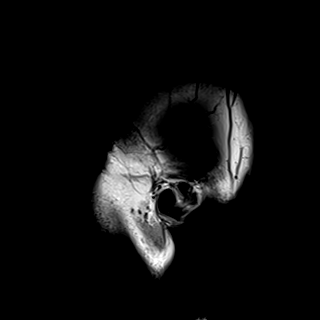

[Series 14: T2 · axial · 5.0mm · 0.69mm/px · z∈[+6,+161]mm · 2 of 27 slices shown (1 of 2)]
[im 1/27]
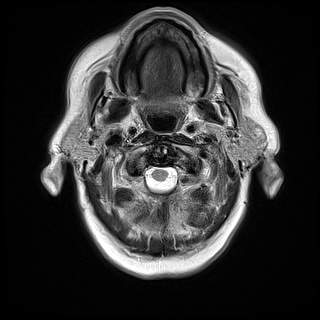
[im 27/27]
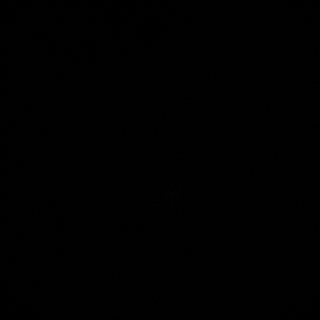

[Series 15: FLAIR · axial · 5.0mm · 0.43mm/px · z∈[+5,+160]mm · 2 of 27 slices shown]
[im 1/27]
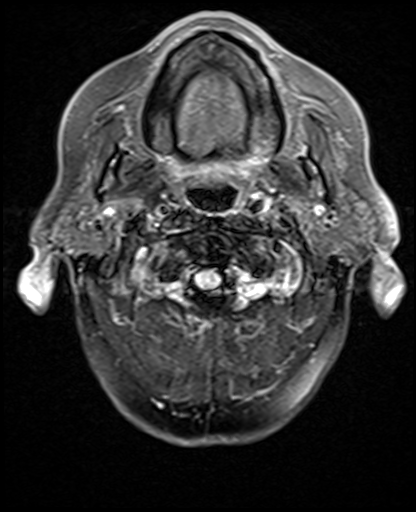
[im 27/27]
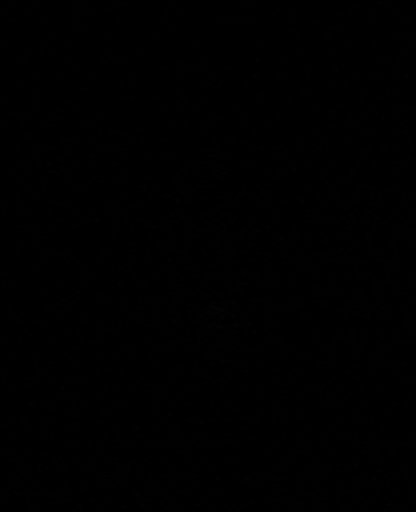

[Series 16: swi_images · axial · 3.0mm · 0.86mm/px · z∈[-4,+172]mm · 4 of 60 slices shown]
[im 1/60]
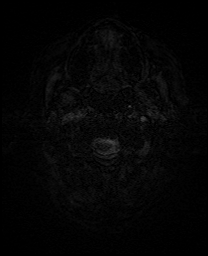
[im 20/60]
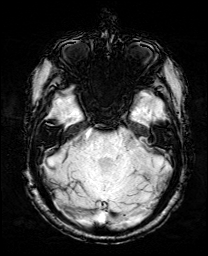
[im 40/60]
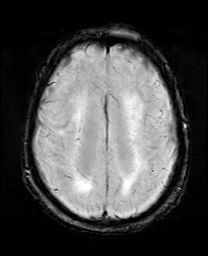
[im 60/60]
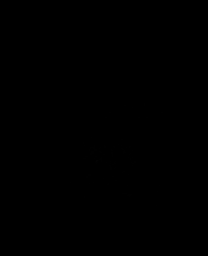

[Series 17: mip_images(sw) · axial · 24.0mm · 0.86mm/px · z∈[+6,+162]mm · 4 of 53 slices shown]
[im 1/53]
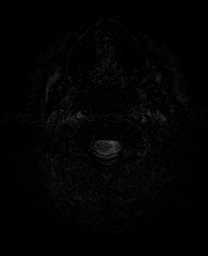
[im 18/53]
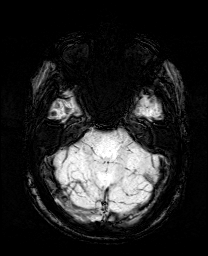
[im 35/53]
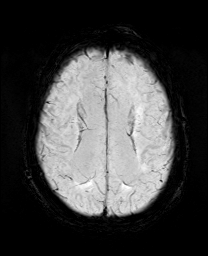
[im 53/53]
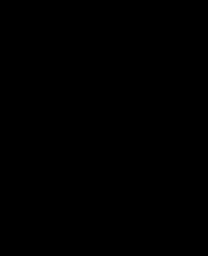

[Series 19: T2 · coronal · 5.0mm · 0.72mm/px · 2 of 28 slices shown (2 of 2)]
[im 1/28]
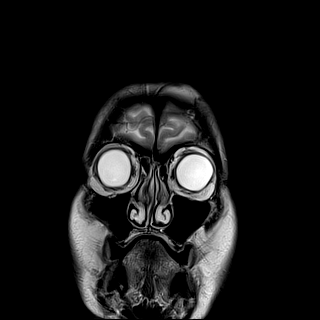
[im 28/28]
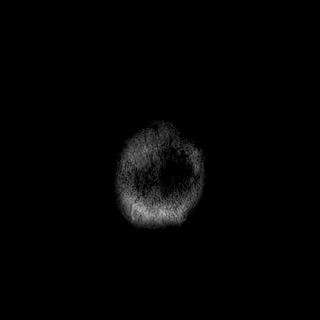

[30 of 48 positions shown; findings below may reference images not displayed]

FINDINGS: MRI HEAD FINDINGS

Brain: Generalized age-related cerebral atrophy. Patchy and
confluent T2/FLAIR hyperintensity within the periventricular and
deep white matter both cerebral hemispheres, most consistent with
chronic small vessel ischemic change, moderate to advanced in
nature. He scattered superimposed remote lacunar infarcts present
within the hemispheric cerebral white matter bilaterally.

No abnormal foci of restricted diffusion suggest acute or subacute.
Gray-white matter differentiation. No evidence for acute
intracranial hemorrhage. Few scattered chronic micro hemorrhages
noted within the cerebellum and deep gray nuclei, like related to
chronic microvascular ischemic disease and/or hypertension.

No mass lesion, midline shift or mass effect. Ventricular prominence
related to global parenchymal volume loss without hydrocephalus.
Extra-axial collection. Pituitary gland within normal limits.

Vascular: Major intracranial vascular flow voids maintained.

Skull and upper cervical spine: Craniocervical junction normal.
Upper cervical spine within normal limits. Bone marrow signal
intensity normal. Small lipoma noted at the left frontal scalp.

Sinuses/Orbits: Globes and orbital soft tissues demonstrate no acute
finding. Patient status post lens extraction bilaterally. Small
right maxillary sinus retention cyst. No mastoid effusion.

Other: None.

MRA HEAD FINDINGS

ANTERIOR CIRCULATION:

Internal carotid arteries patent to the termini without
hemodynamically significant stenosis. Aplastic right A1 with widely
patent left A1. Azygos A2 without stenosis. Moderate atheromatous
irregularity within visualized distal ACA branches. No M1 stenosis
or occlusion. Moderate bilateral M2 stenoses with prominent distal
atheromatous irregularity throughout the MCA branches.

POSTERIOR CIRCULATION:

Dominant left vertebral artery widely patent. Hypoplastic right
vertebral artery terminates in PICA. Posterior inferior cerebral
arteries patent bilaterally. Vertebrobasilar tortuosity. Superior
cerebral arteries patent proximally. PCAs supplied via the basilar.
Moderate bilateral P2 stenoses, tandem on the left. Distal PCA small
vessel atheromatous irregularity.

No aneurysm.
IMPRESSION: MRI HEAD IMPRESSION:

1. No acute intracranial infarct or other abnormality.
2. Age-related cerebral atrophy with moderate to advanced chronic
small vessel ischemic disease.

MRA HEAD IMPRESSION:

1. Negative MRA for large vessel occlusion.
2. Extensive intracranial atherosclerosis with moderate bilateral M2
and P2 stenoses.
3. Aplastic right A1. Azygos A2 supplied via the left carotid artery
system.

## 2020-05-04 IMAGING — CR DG CHEST 2V
3 series · 3 of 3 positions shown · non-contrast
Comparison: Chest CT December 30, 2016

CLINICAL DATA: Visual alterations.  Hypertension.

EXAM:
CHEST - 2 VIEW

[chest lat]
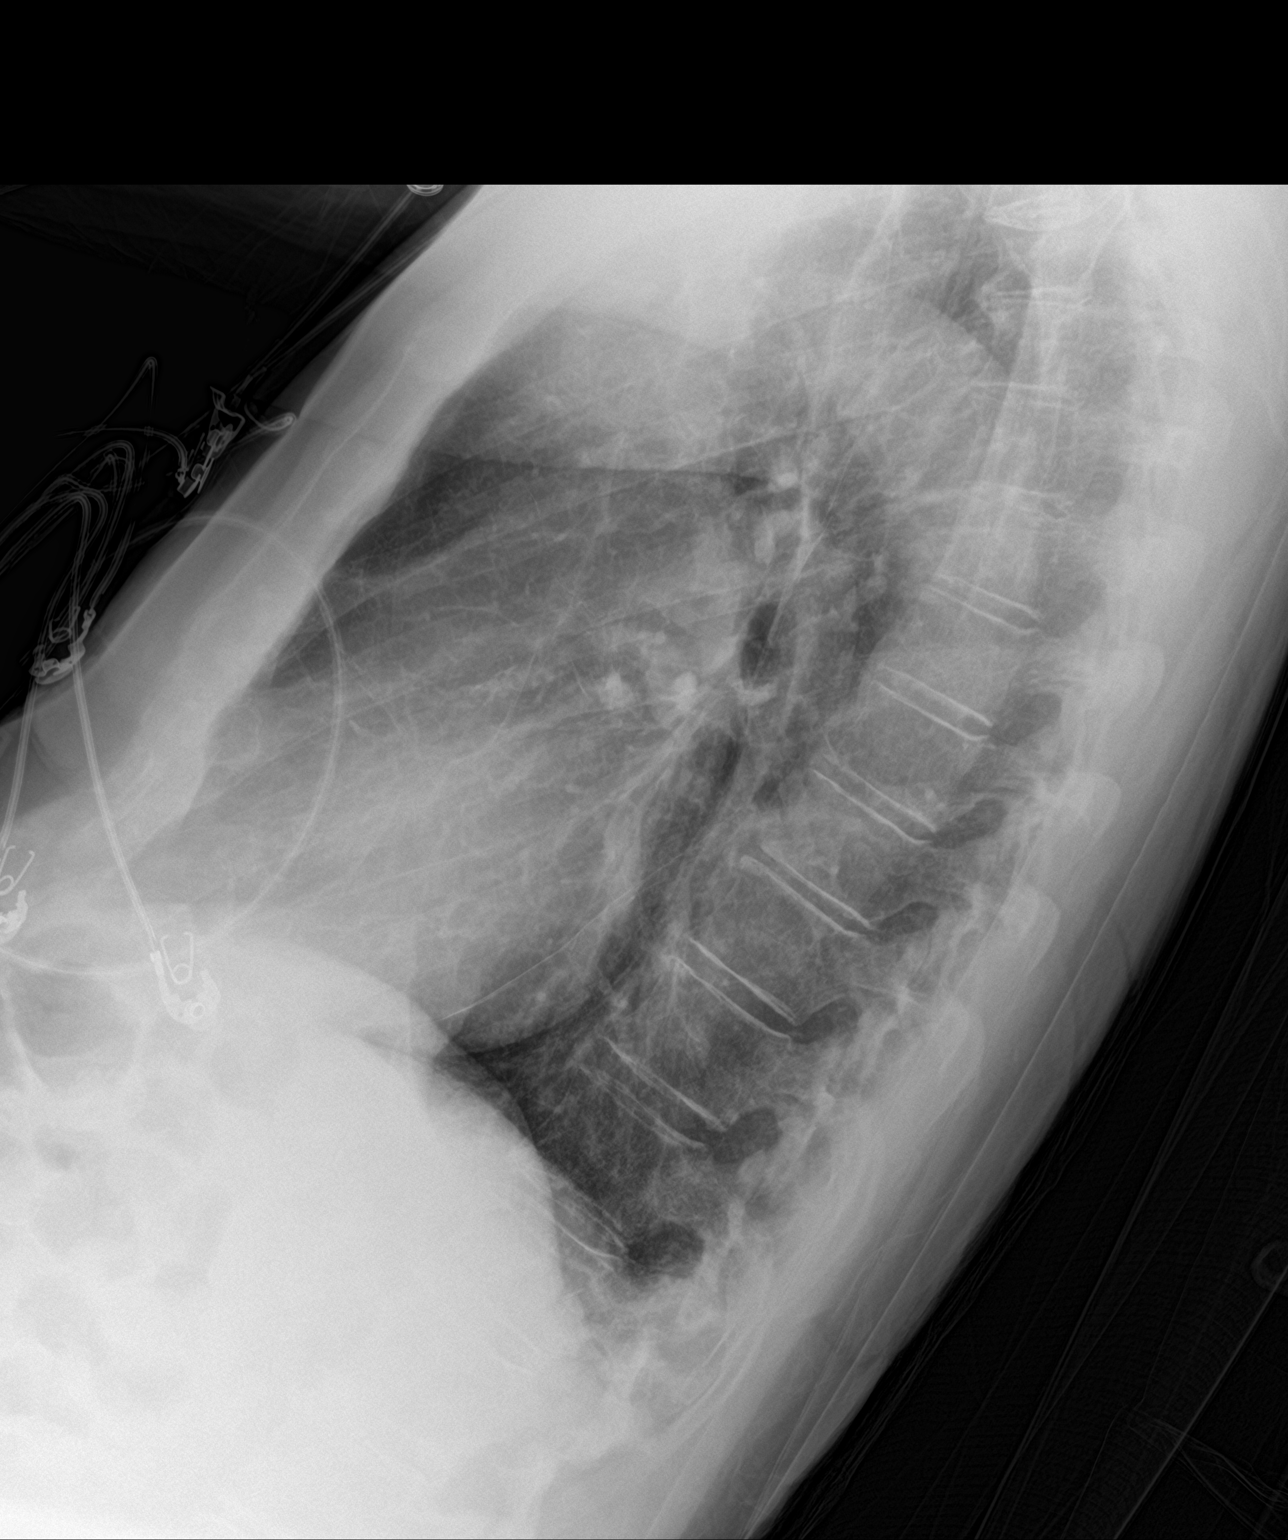

[chest ap (1 of 2)]
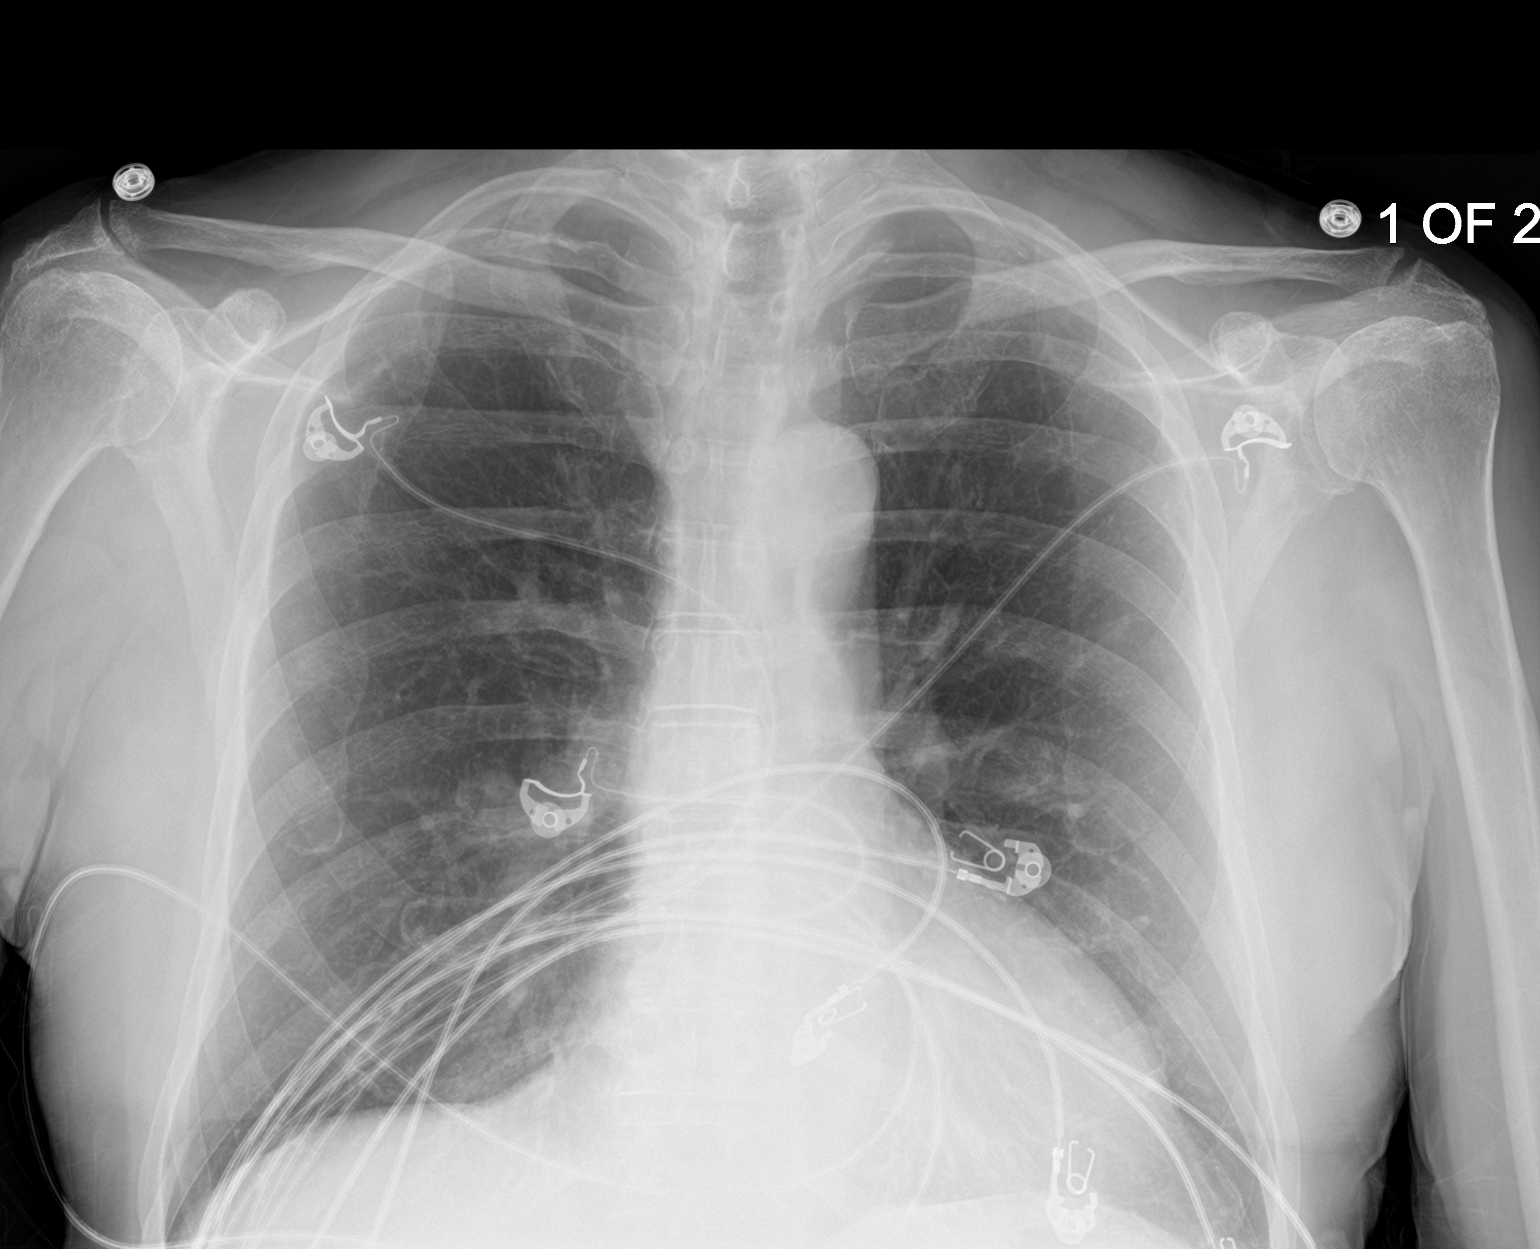

[chest ap (2 of 2)]
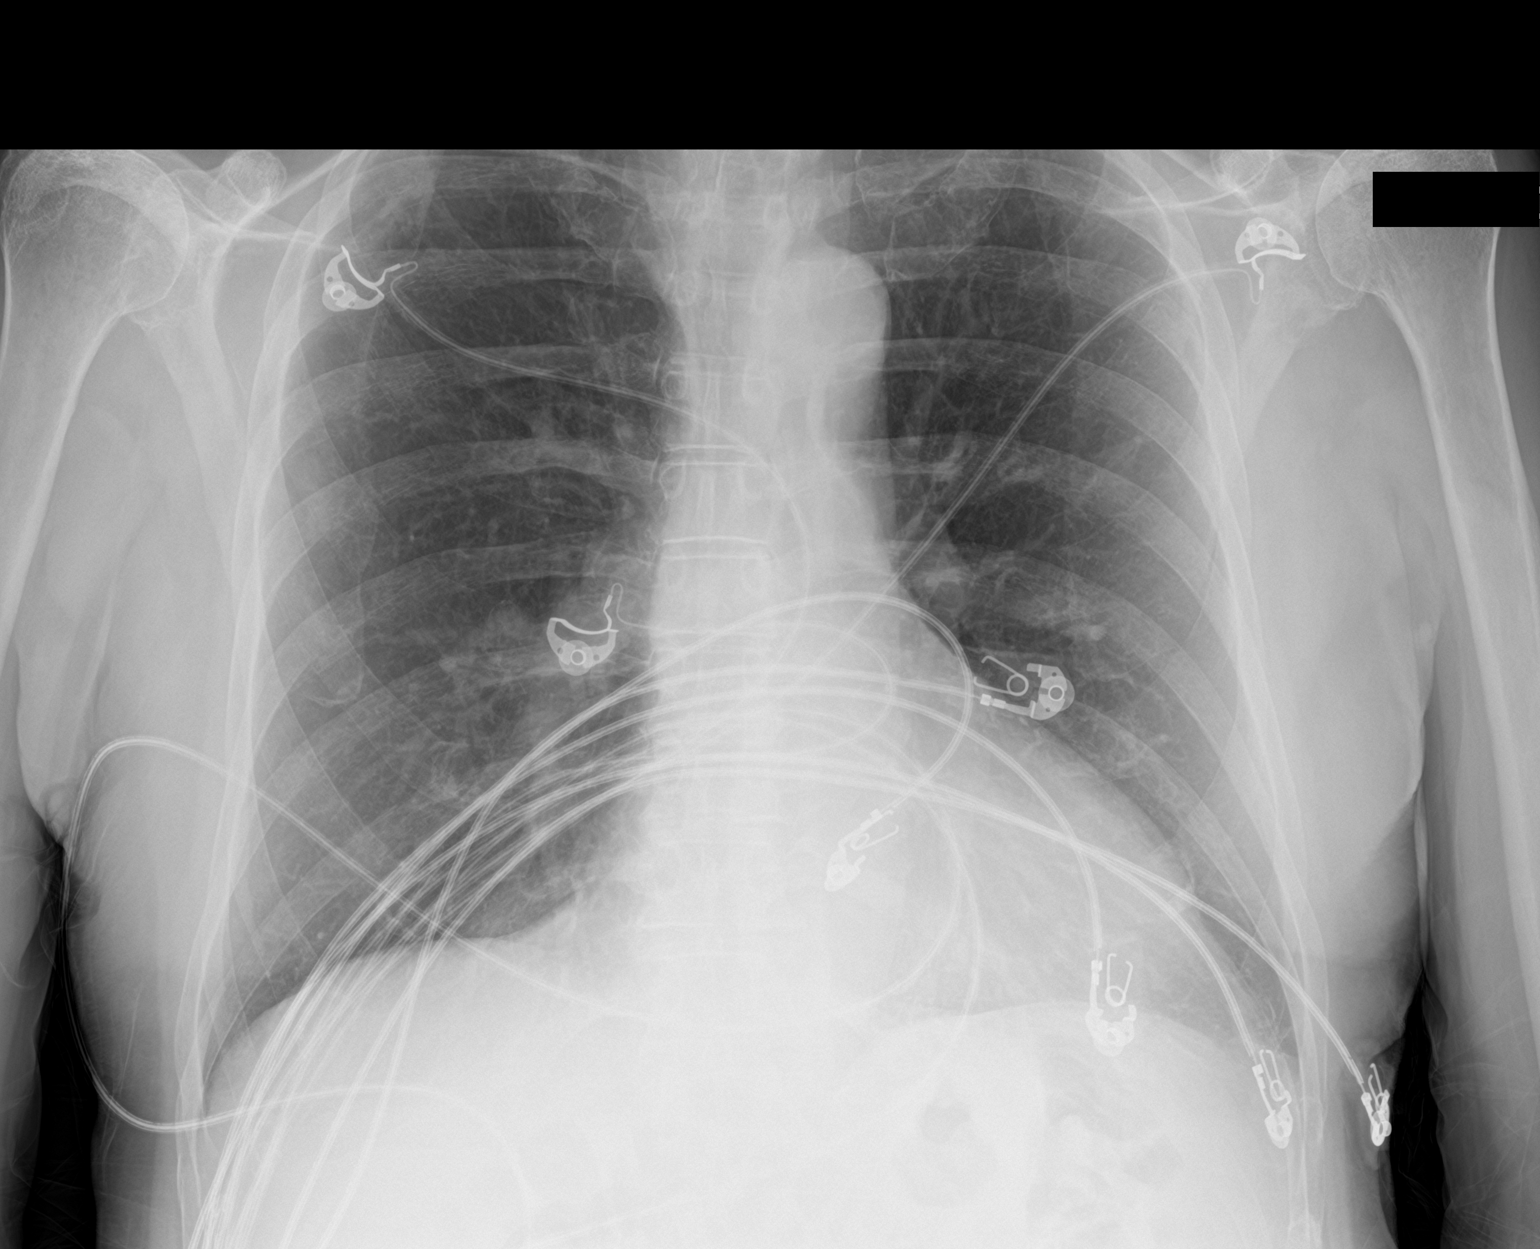

[3 of 3 positions shown; findings below may reference images not displayed]

FINDINGS: No edema or consolidation. Heart is upper normal in size with
pulmonary vascularity normal. No adenopathy. No bone lesions.
IMPRESSION: No edema or consolidation.

## 2020-05-06 ENCOUNTER — Other Ambulatory Visit: Payer: Self-pay

## 2020-05-06 DIAGNOSIS — E785 Hyperlipidemia, unspecified: Secondary | ICD-10-CM

## 2020-05-06 NOTE — Telephone Encounter (Signed)
Labs ordered per request.

## 2020-05-06 NOTE — Telephone Encounter (Signed)
Appt made for labs, fasting lipid. Put in order. Do you want any other labs drawn?  Lab appointment 05/09/20, OV 05/15/20.

## 2020-05-06 NOTE — Telephone Encounter (Signed)
Can you add TSH,CMP,CBC with Diff also? Thanks

## 2020-05-07 ENCOUNTER — Other Ambulatory Visit: Payer: Self-pay

## 2020-05-07 DIAGNOSIS — F320 Major depressive disorder, single episode, mild: Secondary | ICD-10-CM

## 2020-05-07 DIAGNOSIS — E785 Hyperlipidemia, unspecified: Secondary | ICD-10-CM

## 2020-05-07 DIAGNOSIS — I10 Essential (primary) hypertension: Secondary | ICD-10-CM

## 2020-05-09 ENCOUNTER — Other Ambulatory Visit: Payer: Self-pay

## 2020-05-09 DIAGNOSIS — E785 Hyperlipidemia, unspecified: Secondary | ICD-10-CM

## 2020-05-09 DIAGNOSIS — F320 Major depressive disorder, single episode, mild: Secondary | ICD-10-CM

## 2020-05-09 DIAGNOSIS — I10 Essential (primary) hypertension: Secondary | ICD-10-CM | POA: Diagnosis not present

## 2020-05-10 LAB — LIPID PANEL
Cholesterol: 123 mg/dL (ref <200)
HDL: 43 mg/dL (ref >=40)
LDL Cholesterol (Calc): 67 mg/dL (calc)
Non-HDL Cholesterol (Calc): 80 mg/dL (calc) (ref <130)
Total CHOL/HDL Ratio: 2.9 (calc) (ref <5.0)
Triglycerides: 54 mg/dL (ref <150)

## 2020-05-10 LAB — CBC
HCT: 43.5 % (ref 38.5–50.0)
Hemoglobin: 14.9 g/dL (ref 13.2–17.1)
MCH: 30.7 pg (ref 27.0–33.0)
MCHC: 34.3 g/dL (ref 32.0–36.0)
MCV: 89.5 fL (ref 80.0–100.0)
MPV: 10 fL (ref 7.5–12.5)
Platelets: 187 10*3/uL (ref 140–400)
RBC: 4.86 10*6/uL (ref 4.20–5.80)
RDW: 12.9 % (ref 11.0–15.0)
WBC: 7.4 10*3/uL (ref 3.8–10.8)

## 2020-05-10 LAB — COMPLETE METABOLIC PANEL WITH GFR
AG Ratio: 2.1 (calc) (ref 1.0–2.5)
ALT: 18 U/L (ref 9–46)
AST: 19 U/L (ref 10–35)
Albumin: 4 g/dL (ref 3.6–5.1)
Alkaline phosphatase (APISO): 50 U/L (ref 35–144)
BUN: 20 mg/dL (ref 7–25)
CO2: 27 mmol/L (ref 20–32)
Calcium: 8.6 mg/dL (ref 8.6–10.3)
Chloride: 106 mmol/L (ref 98–110)
Creat: 0.84 mg/dL (ref 0.70–1.11)
GFR, Est African American: 89 mL/min/{1.73_m2} (ref >=60)
GFR, Est Non African American: 77 mL/min/{1.73_m2} (ref >=60)
Globulin: 1.9 g/dL (calc) (ref 1.9–3.7)
Glucose, Bld: 85 mg/dL (ref 65–99)
Potassium: 4.2 mmol/L (ref 3.5–5.3)
Sodium: 140 mmol/L (ref 135–146)
Total Bilirubin: 0.9 mg/dL (ref 0.2–1.2)
Total Protein: 5.9 g/dL — ABNORMAL LOW (ref 6.1–8.1)

## 2020-05-10 LAB — TSH: TSH: 2.38 mIU/L (ref 0.40–4.50)

## 2020-05-15 ENCOUNTER — Encounter: Payer: Self-pay | Admitting: Internal Medicine

## 2020-05-15 ENCOUNTER — Other Ambulatory Visit: Payer: Self-pay

## 2020-05-15 ENCOUNTER — Non-Acute Institutional Stay: Payer: Medicare Other | Admitting: Internal Medicine

## 2020-05-15 VITALS — BP 162/76 | HR 73 | Temp 97.1°F | Ht 69.0 in | Wt 166.6 lb

## 2020-05-15 DIAGNOSIS — G459 Transient cerebral ischemic attack, unspecified: Secondary | ICD-10-CM

## 2020-05-15 DIAGNOSIS — E785 Hyperlipidemia, unspecified: Secondary | ICD-10-CM | POA: Diagnosis not present

## 2020-05-15 DIAGNOSIS — F320 Major depressive disorder, single episode, mild: Secondary | ICD-10-CM | POA: Diagnosis not present

## 2020-05-15 DIAGNOSIS — I10 Essential (primary) hypertension: Secondary | ICD-10-CM | POA: Diagnosis not present

## 2020-05-15 NOTE — Progress Notes (Signed)
Location:  [place] of Service:  Clinic (12)  Provider:   Code Status:  Goals of Care:  Advanced Directives 11/30/2018  Does Patient Have a Medical Advance Directive? Yes  Type of Paramedic of [place];Out of facility DNR (pink MOST or yellow form)  Does patient want to make changes to medical advance directive? No - Patient declined  Copy of [place] in Chart? Yes - validated most recent copy scanned in chart (See row information)  Would patient like information on creating a medical advance directive? -  Pre-existing out of facility DNR order (yellow form or pink MOST form) Yellow form placed in chart (order not valid for inpatient use)     Chief Complaint  Patient presents with   Medical Management of Chronic Issues    Patient returns to the clinic for 2 month follow up.     HPI: Patient is a 86 y.o. male seen today for medical management of chronic diseases.    Hypertesnnsion BP better today Running in good level per facility Nurse check up Depression Wants to know if he can talk to Psychologist Does not want any Antidepressants  Pain and stiffness in Right Hip and knee When gets in the morning Cognition MMSE 30/30 Has sometimes issues with words  Other issues  History of TIA versus migraine Had detailed work-upbyneurology for his atypical symptoms of visual impairment. With Aphasia and Word finding.He has had detail work ups including EEG, Echo and MRI .which have been Negative. They have recommended Asprin, BP control and Statin. No Episodes recently  Past Medical History:  Diagnosis Date   Actinic keratosis 10/18/2012   Anisocoria 10/30/2014   Left pupil is larger than the right postsurgery corneal transplant.    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/18/2012    Deafness 10/18/2012   Endothelial corneal dystrophy    Essential hypertension 10/18/2012   Hammer toe 04/24/2014   Hyperlipidemia 10/18/2012   Intermediate stage nonexudative age-related macular degeneration of both eyes 04/16/2016   Nocturia 05/19/2016   Occlusion and stenosis of carotid artery with cerebral infarction    Other specified cardiac dysrhythmias(427.89)    Pain in joint, pelvic region and thigh    Paresthesia 04/30/2015   Toes of both feet    Peripheral vascular disease, unspecified ([place])    Primary open angle glaucoma 09/25/2011   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   Rosacea    Seborrheic keratosis 04/24/2014   Syncope 06/27/2013   TIA (transient ischemic attack) 10/18/2012   States he has had about 5  times   Urine frequency     Past Surgical History:  Procedure Laterality Date   CATARACT EXTRACTION  2005   bilateral   CORNEAL TRANSPLANT  2010 left; 2013 right eye   [place]   Left   HERNIA REPAIR  03/2004   Right   SHOULDER DEBRIDEMENT Left 2005   TONSILLECTOMY Bilateral childhood   TRANSURETHRAL RESECTION OF PROSTATE N/A 10/2003   VASECTOMY      No Known Allergies  Outpatient Encounter Medications as of 05/15/2020  Medication Sig   amLODipine (NORVASC) 5 MG tablet Take 1 tablet (5 mg total) by mouth daily.   aspirin 325 MG tablet Take 325 mg by mouth every evening.   atorvastatin (LIPITOR) 40 MG tablet TAKE 1 TABLET DAILY   DORZOLAMIDE HCL-TIMOLOL  MAL OP Apply to eye.   enalapril (VASOTEC) 20 MG tablet TAKE 1 TABLET TWICE A DAY TO CONTROL BLOOD PRESSURE   loratadine (CLARITIN) 10 MG tablet Take 10 mg by mouth daily.   Multiple Vitamins-Minerals (PRESERVISION AREDS 2) CAPS Take 1 capsule by mouth 2 (two) times daily.   Omega-3 Fatty Acids (FISH OIL) 1000 MG CAPS Take 1,000 mg by mouth 2 (two) times daily.    No facility-administered encounter medications on file as of 05/15/2020.    Review of Systems:  Review of Systems    Constitutional: Negative.   HENT: Negative.   Respiratory: Negative.   Cardiovascular: Negative.   Gastrointestinal: Negative.   Genitourinary: Negative.   Musculoskeletal: Positive for arthralgias, gait problem and myalgias.  Skin: Negative.   Neurological: Negative for dizziness.  Psychiatric/Behavioral: Positive for dysphoric mood and sleep disturbance. The patient is nervous/anxious.     Health Maintenance  Topic Date Due   TETANUS/TDAP  02/25/2028   INFLUENZA VACCINE  Completed   COVID-19 Vaccine  Completed   PNA vac Low Risk Adult  Completed    Physical Exam: Vitals:   05/15/20 1511  BP: (!) 162/76  Pulse: 73  Temp: (!) 97.1 F (36.2 C)  SpO2: 97%  Weight: 166 lb 9.6 oz (75.6 kg)  Height: 5\' 9"  (1.753 m)   Body mass index is 24.6 kg/m. Physical Exam  Constitutional: Oriented to person, place, and time. Well-developed and well-nourished.  HENT:  Head: Normocephalic.  Mouth/Throat: Oropharynx is clear and moist.  Eyes: Pupils are equal, round, and reactive to light.  Neck: Neck supple.  Cardiovascular: Normal rate and normal heart sounds.  No murmur heard. Pulmonary/Chest: Effort normal and breath sounds normal. No respiratory distress. No wheezes. She has no rales.  Abdominal: Soft. Bowel sounds are normal. No distension. There is no tenderness. There is no rebound.  Musculoskeletal: No edema.  Right leg more stiff with movement but not Tender Lymphadenopathy: none Neurological: Alert and oriented to person, place, and time.  Slightly limps on his Right leg  Skin: Skin is warm and dry.  Psychiatric: Normal mood and affect. Behavior is normal. Thought content normal.   Labs reviewed: Basic Metabolic Panel: Recent Labs    06/05/19 0805 01/10/20 0821 05/09/20 0810  NA 142 140 140  K 4.4 4.1 4.2  CL 107 107 106  CO2 29 28 27   GLUCOSE 82 86 85  BUN 20 22 20   CREATININE 0.94 0.87 0.84  CALCIUM 8.9 8.5* 8.6  TSH  --   --  2.38   Liver Function  Tests: Recent Labs    06/05/19 0805 01/10/20 0821 05/09/20 0810  AST 21 19 19   ALT 20 15 18   BILITOT 1.1 0.9 0.9  PROT 6.0* 5.5* 5.9*   No results for input(s): LIPASE, AMYLASE in the last 8760 hours. No results for input(s): AMMONIA in the last 8760 hours. CBC: Recent Labs    01/10/20 0821 05/09/20 0810  WBC 7.6 7.4  NEUTROABS 5,046  --   HGB 14.6 14.9  HCT 42.1 43.5  MCV 87.9 89.5  PLT 179 187   Lipid Panel: Recent Labs    06/05/19 0805 05/09/20 0810  CHOL 118 123  HDL 38* 43  LDLCALC 65 67  TRIG 69 54  CHOLHDL 3.1 2.9   No results found for: HGBA1C  Procedures since last visit: No results found.  Assessment/Plan 1. Essential hypertension Continue Norvasc and Enalapril No change in the dose Will monitor BP with Facility  Nurse  2. TIA (transient ischemic attack) On Aspirin,Lipitor and BP control  3. Depression, major, single episode, mild ([place]) Referal to [place] for Psychologist Does not want Antidepressants  4. Hyperlipidemia, unspecified hyperlipidemia type LDL less then 100 On Statin  H/o Skin cancer Follows with Dermatology  Right leg arthritis both knee and and hip Can use tylenol prn Refuses Xrays or Therapy for now    Labs/tests ordered:  * No order type specified * Next appt:  09/11/2020

## 2020-05-15 NOTE — Patient Instructions (Signed)
[facility] Address: [address], [place], [place] [address] Phone: [phone]

## 2020-05-20 ENCOUNTER — Other Ambulatory Visit: Payer: Self-pay | Admitting: Internal Medicine

## 2020-06-03 DIAGNOSIS — Z23 Encounter for immunization: Secondary | ICD-10-CM | POA: Diagnosis not present

## 2020-06-18 DIAGNOSIS — L821 Other seborrheic keratosis: Secondary | ICD-10-CM | POA: Diagnosis not present

## 2020-06-18 DIAGNOSIS — C44329 Squamous cell carcinoma of skin of other parts of face: Secondary | ICD-10-CM | POA: Diagnosis not present

## 2020-06-18 DIAGNOSIS — D692 Other nonthrombocytopenic purpura: Secondary | ICD-10-CM | POA: Diagnosis not present

## 2020-06-18 DIAGNOSIS — L814 Other melanin hyperpigmentation: Secondary | ICD-10-CM | POA: Diagnosis not present

## 2020-06-18 DIAGNOSIS — Z85828 Personal history of other malignant neoplasm of skin: Secondary | ICD-10-CM | POA: Diagnosis not present

## 2020-06-18 DIAGNOSIS — L570 Actinic keratosis: Secondary | ICD-10-CM | POA: Diagnosis not present

## 2020-06-20 DIAGNOSIS — R2681 Unsteadiness on feet: Secondary | ICD-10-CM | POA: Diagnosis not present

## 2020-06-20 DIAGNOSIS — R2689 Other abnormalities of gait and mobility: Secondary | ICD-10-CM | POA: Diagnosis not present

## 2020-06-20 DIAGNOSIS — M79671 Pain in right foot: Secondary | ICD-10-CM | POA: Diagnosis not present

## 2020-06-20 DIAGNOSIS — M6281 Muscle weakness (generalized): Secondary | ICD-10-CM | POA: Diagnosis not present

## 2020-06-21 DIAGNOSIS — M79672 Pain in left foot: Secondary | ICD-10-CM | POA: Diagnosis not present

## 2020-06-21 DIAGNOSIS — M79671 Pain in right foot: Secondary | ICD-10-CM | POA: Diagnosis not present

## 2020-06-21 DIAGNOSIS — B351 Tinea unguium: Secondary | ICD-10-CM | POA: Diagnosis not present

## 2020-06-25 DIAGNOSIS — R2681 Unsteadiness on feet: Secondary | ICD-10-CM | POA: Diagnosis not present

## 2020-06-25 DIAGNOSIS — M6281 Muscle weakness (generalized): Secondary | ICD-10-CM | POA: Diagnosis not present

## 2020-06-25 DIAGNOSIS — R2689 Other abnormalities of gait and mobility: Secondary | ICD-10-CM | POA: Diagnosis not present

## 2020-06-25 DIAGNOSIS — M79671 Pain in right foot: Secondary | ICD-10-CM | POA: Diagnosis not present

## 2020-06-27 DIAGNOSIS — M6281 Muscle weakness (generalized): Secondary | ICD-10-CM | POA: Diagnosis not present

## 2020-06-27 DIAGNOSIS — R2689 Other abnormalities of gait and mobility: Secondary | ICD-10-CM | POA: Diagnosis not present

## 2020-06-27 DIAGNOSIS — M79671 Pain in right foot: Secondary | ICD-10-CM | POA: Diagnosis not present

## 2020-06-27 DIAGNOSIS — R2681 Unsteadiness on feet: Secondary | ICD-10-CM | POA: Diagnosis not present

## 2020-07-02 DIAGNOSIS — R2689 Other abnormalities of gait and mobility: Secondary | ICD-10-CM | POA: Diagnosis not present

## 2020-07-02 DIAGNOSIS — M79671 Pain in right foot: Secondary | ICD-10-CM | POA: Diagnosis not present

## 2020-07-02 DIAGNOSIS — R2681 Unsteadiness on feet: Secondary | ICD-10-CM | POA: Diagnosis not present

## 2020-07-02 DIAGNOSIS — M6281 Muscle weakness (generalized): Secondary | ICD-10-CM | POA: Diagnosis not present

## 2020-07-04 DIAGNOSIS — R2681 Unsteadiness on feet: Secondary | ICD-10-CM | POA: Diagnosis not present

## 2020-07-04 DIAGNOSIS — M6281 Muscle weakness (generalized): Secondary | ICD-10-CM | POA: Diagnosis not present

## 2020-07-04 DIAGNOSIS — R2689 Other abnormalities of gait and mobility: Secondary | ICD-10-CM | POA: Diagnosis not present

## 2020-07-04 DIAGNOSIS — M79671 Pain in right foot: Secondary | ICD-10-CM | POA: Diagnosis not present

## 2020-07-08 DIAGNOSIS — M6281 Muscle weakness (generalized): Secondary | ICD-10-CM | POA: Diagnosis not present

## 2020-07-08 DIAGNOSIS — R2689 Other abnormalities of gait and mobility: Secondary | ICD-10-CM | POA: Diagnosis not present

## 2020-07-08 DIAGNOSIS — R2681 Unsteadiness on feet: Secondary | ICD-10-CM | POA: Diagnosis not present

## 2020-07-08 DIAGNOSIS — M79671 Pain in right foot: Secondary | ICD-10-CM | POA: Diagnosis not present

## 2020-07-11 DIAGNOSIS — R2689 Other abnormalities of gait and mobility: Secondary | ICD-10-CM | POA: Diagnosis not present

## 2020-07-11 DIAGNOSIS — M79671 Pain in right foot: Secondary | ICD-10-CM | POA: Diagnosis not present

## 2020-07-11 DIAGNOSIS — M6281 Muscle weakness (generalized): Secondary | ICD-10-CM | POA: Diagnosis not present

## 2020-07-11 DIAGNOSIS — R2681 Unsteadiness on feet: Secondary | ICD-10-CM | POA: Diagnosis not present

## 2020-07-16 DIAGNOSIS — M79671 Pain in right foot: Secondary | ICD-10-CM | POA: Diagnosis not present

## 2020-07-16 DIAGNOSIS — M6281 Muscle weakness (generalized): Secondary | ICD-10-CM | POA: Diagnosis not present

## 2020-07-16 DIAGNOSIS — R2681 Unsteadiness on feet: Secondary | ICD-10-CM | POA: Diagnosis not present

## 2020-07-16 DIAGNOSIS — R2689 Other abnormalities of gait and mobility: Secondary | ICD-10-CM | POA: Diagnosis not present

## 2020-07-18 DIAGNOSIS — R2681 Unsteadiness on feet: Secondary | ICD-10-CM | POA: Diagnosis not present

## 2020-07-18 DIAGNOSIS — M79671 Pain in right foot: Secondary | ICD-10-CM | POA: Diagnosis not present

## 2020-07-18 DIAGNOSIS — R2689 Other abnormalities of gait and mobility: Secondary | ICD-10-CM | POA: Diagnosis not present

## 2020-07-18 DIAGNOSIS — M6281 Muscle weakness (generalized): Secondary | ICD-10-CM | POA: Diagnosis not present

## 2020-08-01 DIAGNOSIS — Z961 Presence of intraocular lens: Secondary | ICD-10-CM | POA: Diagnosis not present

## 2020-08-01 DIAGNOSIS — H401132 Primary open-angle glaucoma, bilateral, moderate stage: Secondary | ICD-10-CM | POA: Diagnosis not present

## 2020-08-01 DIAGNOSIS — H353132 Nonexudative age-related macular degeneration, bilateral, intermediate dry stage: Secondary | ICD-10-CM | POA: Diagnosis not present

## 2020-08-01 DIAGNOSIS — Z947 Corneal transplant status: Secondary | ICD-10-CM | POA: Diagnosis not present

## 2020-08-07 ENCOUNTER — Encounter: Payer: Medicare Other | Admitting: Internal Medicine

## 2020-08-07 ENCOUNTER — Encounter: Payer: Self-pay | Admitting: Internal Medicine

## 2020-08-07 ENCOUNTER — Non-Acute Institutional Stay: Payer: Medicare Other | Admitting: Internal Medicine

## 2020-08-07 ENCOUNTER — Other Ambulatory Visit: Payer: Self-pay

## 2020-08-07 VITALS — BP 146/80 | HR 77 | Temp 96.8°F | Ht 69.0 in | Wt 167.6 lb

## 2020-08-07 DIAGNOSIS — E785 Hyperlipidemia, unspecified: Secondary | ICD-10-CM

## 2020-08-07 DIAGNOSIS — M545 Low back pain, unspecified: Secondary | ICD-10-CM | POA: Diagnosis not present

## 2020-08-07 DIAGNOSIS — I10 Essential (primary) hypertension: Secondary | ICD-10-CM

## 2020-08-07 DIAGNOSIS — R2681 Unsteadiness on feet: Secondary | ICD-10-CM

## 2020-08-07 DIAGNOSIS — F320 Major depressive disorder, single episode, mild: Secondary | ICD-10-CM | POA: Diagnosis not present

## 2020-08-07 DIAGNOSIS — G459 Transient cerebral ischemic attack, unspecified: Secondary | ICD-10-CM

## 2020-08-07 MED ORDER — MELOXICAM 7.5 MG PO TABS: 7.5000 mg | ORAL_TABLET | Freq: Every day | ORAL | 0 refills | Status: AC

## 2020-08-07 NOTE — Patient Instructions (Addendum)
Meloxicam 7.5 mg once a day with food Calling in in [place] to deliver. Please call them and get Tylenol ES delivered too Can take Tylenol ES 650 mg 2-3 times a day to help pain Can use heating pad I will have Physical Therapy to come and work with you

## 2020-08-08 NOTE — Progress Notes (Signed)
Location: [place] of Service:  Clinic (12)  Provider:   Code Status:  Goals of Care:  Advanced Directives 11/30/2018  Does Patient Have a Medical Advance Directive? Yes  Type of Paramedic of [place];Out of facility DNR (pink MOST or yellow form)  Does patient want to make changes to medical advance directive? No - Patient declined  Copy of [place] in Chart? Yes - validated most recent copy scanned in chart (See row information)  Would patient like information on creating a medical advance directive? -  Pre-existing out of facility DNR order (yellow form or pink MOST form) Yellow form placed in chart (order not valid for inpatient use)     Chief Complaint  Patient presents with  . Acute Visit    Recent fall    HPI: Patient is a 87 y.o. male seen today for an acute visit for Back Discomfort after his fall  On Jan 14th  Patient has h/o Hypertension and Depression, TIA, Skin cancer  He fell few days ago Seems like mechanical Fall. Was sitting in his chair and fell backwards in his apartment Got up by himself. Did not feel he had any injuries But since then having Low back discomfort Not localized more both sides No Sciatic symptoms. Also Now thinks he is little off balance. No Dizziness or Vertigo No Headache. No Bowel or bladder issues  Usually walks with No assist.Independent in ADLS and IADLS Driving No Falls POA son in [place]   Past Medical History:  Diagnosis Date  . Actinic keratosis 10/18/2012  . 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   . Atrophy, Fuchs' 12/10/2011  . 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 Cervicalgia 04/25/2013  . 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 Cyclitis, chronic 11/18/2012  . Deafness 10/18/2012  . Endothelial corneal dystrophy   . Essential hypertension 10/18/2012  . Hammer toe 04/24/2014  . Hyperlipidemia 10/18/2012  .  Intermediate stage nonexudative age-related macular degeneration of both eyes 04/16/2016  . Nocturia 05/19/2016  . Occlusion and stenosis of carotid artery with cerebral infarction   . Other specified cardiac dysrhythmias(427.89)   . Pain in joint, pelvic region and thigh   . Paresthesia 04/30/2015   Toes of both feet   . Peripheral vascular disease, unspecified ([place])   . Primary open angle glaucoma 09/25/2011  . 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  . Rosacea   . Seborrheic keratosis 04/24/2014  . Syncope 06/27/2013  . TIA (transient ischemic attack) 10/18/2012   States he has had about 5  times  . Urine frequency     Past Surgical History:  Procedure Laterality Date  . CATARACT EXTRACTION  2005   bilateral  . CORNEAL TRANSPLANT  2010 left; 2013 right eye  . HERNIA REPAIR  1985   Left  . HERNIA REPAIR  03/2004   Right  . SHOULDER DEBRIDEMENT Left 2005  . TONSILLECTOMY Bilateral childhood  . TRANSURETHRAL RESECTION OF PROSTATE N/A 10/2003  . VASECTOMY      No Known Allergies  Outpatient Encounter Medications as of 08/07/2020  Medication Sig  . amLODipine (NORVASC) 5 MG tablet Take 1 tablet (5 mg total) by mouth daily.  Marland Kitchen aspirin 325 MG tablet Take 325 mg by mouth every evening.  Marland Kitchen atorvastatin (LIPITOR) 40 MG tablet Take one tablet by mouth once daily  . DORZOLAMIDE HCL-TIMOLOL MAL OP Apply to eye.  . enalapril (VASOTEC) 20  MG tablet TAKE 1 TABLET TWICE A DAY TO CONTROL BLOOD PRESSURE  . loratadine (CLARITIN) 10 MG tablet Take 10 mg by mouth daily.  . meloxicam (MOBIC) 7.5 MG tablet Take 1 tablet (7.5 mg total) by mouth daily for 15 days.  . Multiple Vitamins-Minerals (PRESERVISION AREDS 2) CAPS Take 1 capsule by mouth 2 (two) times daily.  . Omega-3 Fatty Acids (FISH OIL) 1000 MG CAPS Take 1,000 mg by mouth 2 (two) times daily.    No facility-administered encounter medications on file as of 08/07/2020.    Review of Systems:  Review of Systems  Review of Systems  Constitutional: Negative  for  appetite change, chills, diaphoresis, fatigue and fever.  HENT: Negative for mouth sores, postnasal drip, rhinorrhea, sinus pain and sore throat.   Respiratory: Negative for apnea, cough, chest tightness, shortness of breath and wheezing.   Cardiovascular: Negative for chest pain, palpitations and leg swelling.  Gastrointestinal: Negative for abdominal distention, abdominal pain, constipation, diarrhea, nausea and vomiting.  Genitourinary: Negative for dysuria and frequency.  Musculoskeletal: Negative for arthralgias, joint swelling and myalgias.  Skin: Negative for rash.  Neurological: Negative for dizziness, syncope, weakness, light-headedness and numbness.  Psychiatric/Behavioral: Negative for behavioral problems, confusion and sleep disturbance.     Health Maintenance  Topic Date Due  . COVID-19 Vaccine (3 - Moderna risk 4-dose series) 09/18/2019  . TETANUS/TDAP  02/25/2028  . INFLUENZA VACCINE  Completed  . PNA vac Low Risk Adult  Completed    Physical Exam: Vitals:   08/07/20 1620  BP: (!) 146/80  Pulse: 77  Temp: (!) 96.8 F (36 C)  SpO2: 99%  Weight: 167 lb 9.6 oz (76 kg)  Height: 5\' 9"  (1.753 m)   Body mass index is 24.75 kg/m. Physical Exam Constitutional: Oriented to person, place, and time. Well-developed and well-nourished.  HENT:  Head: Normocephalic.  Mouth/Throat: Oropharynx is clear and moist.  Eyes: Pupils are equal, round, and reactive to light.  Neck: Neck supple.  Cardiovascular: Normal rate and normal heart sounds.  No murmur heard. Pulmonary/Chest: Effort normal and breath sounds normal. No respiratory distress. No wheezes. She has no rales.  Abdominal: Soft. Bowel sounds are normal. No distension. There is no tenderness. There is no rebound.  Musculoskeletal: No edema.  Lymphadenopathy: none Neurological: Alert and oriented to person, place, and time Straight leg was negative No Point tenderness in his spine Just generalized discomfort in  lower back Able to walk just little slow.  No Focal Deficits Skin: Skin is warm and dry.  Psychiatric: Normal mood and affect. Behavior is normal. Thought content normal.   Labs reviewed: Basic Metabolic Panel: Recent Labs    01/10/20 0821 05/09/20 0810  NA 140 140  K 4.1 4.2  CL 107 106  CO2 28 27  GLUCOSE 86 85  BUN 22 20  CREATININE 0.87 0.84  CALCIUM 8.5* 8.6  TSH  --  2.38   Liver Function Tests: Recent Labs    01/10/20 0821 05/09/20 0810  AST 19 19  ALT 15 18  BILITOT 0.9 0.9  PROT 5.5* 5.9*   No results for input(s): LIPASE, AMYLASE in the last 8760 hours. No results for input(s): AMMONIA in the last 8760 hours. CBC: Recent Labs    01/10/20 0821 05/09/20 0810  WBC 7.6 7.4  NEUTROABS 5,046  --   HGB 14.6 14.9  HCT 42.1 43.5  MCV 87.9 89.5  PLT 179 187   Lipid Panel: Recent Labs    05/09/20 0810  CHOL 123  HDL 43  LDLCALC 67  TRIG 54  CHOLHDL 2.9   No results found for: HGBA1C  Procedures since last visit: No results found.  Assessment/Plan 1. Acute bilateral low back pain without sciatica Will start on Meloxicam 7.5 mg QD Tylenol ES 2-3 times a day as needed Can use Heating pad PRN - Ambulatory referral to Physical Therapy Use walker till they assess you Will get Xrays if No improvement in few weeks  2. Essential hypertension On Norvasc and Vasotex  3. TIA (transient ischemic attack) Aspirin, Statin  4. Depression, major, single episode, mild ([facility]) No Meds  5. Hyperlipidemia, unspecified hyperlipidemia type On statin   6. Unstable gait - Ambulatory referral to Physical Therapy  Cognition Issues Still doing very well MMSE in 2/21 was 30/30 Passed Clock Drawing  Labs/tests ordered:  * No order type specified * Next appt:  09/04/2020

## 2020-08-15 ENCOUNTER — Ambulatory Visit (INDEPENDENT_AMBULATORY_CARE_PROVIDER_SITE_OTHER): Payer: Medicare Other | Admitting: Nurse Practitioner

## 2020-08-15 ENCOUNTER — Telehealth: Payer: Self-pay

## 2020-08-15 ENCOUNTER — Encounter: Payer: Self-pay | Admitting: Nurse Practitioner

## 2020-08-15 ENCOUNTER — Other Ambulatory Visit: Payer: Self-pay

## 2020-08-15 DIAGNOSIS — Z0000 Encounter for general adult medical examination without abnormal findings: Secondary | ICD-10-CM | POA: Diagnosis not present

## 2020-08-15 NOTE — Progress Notes (Signed)
Subjective:   Russell [name] is a 87 y.o. male who presents for Medicare Annual/Subsequent preventive examination.  Review of Systems     Cardiac Risk Factors include: advanced age (>40men, >15 women);male gender;hypertension;dyslipidemia;family history of premature cardiovascular disease     Objective:    There were no vitals filed for this visit. There is no height or weight on file to calculate BMI.  Advanced Directives 08/15/2020 11/30/2018 07/27/2018 06/29/2018 05/03/2018 01/10/2018 01/06/2018  Does Patient Have a Medical Advance Directive? Yes Yes Yes Yes Yes Yes No  Type of Paramedic of [place];Living will;Out of facility DNR (pink MOST or yellow form) [place];Out of facility DNR (pink MOST or yellow form) [place];Out of facility DNR (pink MOST or yellow form) Healthcare Power of [place];Living will;Out of facility DNR (pink MOST or yellow form) -  Does patient want to make changes to medical advance directive? No - Patient declined No - Patient declined No - Patient declined No - Patient declined No - Patient declined No - Patient declined -  Copy of [place] in Chart? Yes - validated most recent copy scanned in chart (See row information) Yes - validated most recent copy scanned in chart (See row information) Yes - validated most recent copy scanned in chart (See row information) Yes - validated most recent copy scanned in chart (See row information) Yes Yes -  Would patient like information on creating a medical advance directive? - - - - - - -  Pre-existing out of facility DNR order (yellow form or pink MOST form) - Yellow form placed in chart (order not valid for inpatient use) Yellow form placed in chart (order not valid for inpatient use) - Pink MOST form placed in chart (order not valid for inpatient use);Yellow form placed in chart (order  not valid for inpatient use) Pink MOST form placed in chart (order not valid for inpatient use);Yellow form placed in chart (order not valid for inpatient use) -    Current Medications (verified) Outpatient Encounter Medications as of 08/15/2020  Medication Sig  . amLODipine (NORVASC) 5 MG tablet Take 1 tablet (5 mg total) by mouth daily.  Marland Kitchen aspirin 325 MG tablet Take 325 mg by mouth every evening.  Marland Kitchen atorvastatin (LIPITOR) 40 MG tablet Take one tablet by mouth once daily  . DORZOLAMIDE HCL-TIMOLOL MAL OP Apply 1 drop to eye 2 (two) times daily.  . enalapril (VASOTEC) 20 MG tablet TAKE 1 TABLET TWICE A DAY TO CONTROL BLOOD PRESSURE  . loratadine (CLARITIN) 10 MG tablet Take 10 mg by mouth daily.  . meloxicam (MOBIC) 7.5 MG tablet Take 1 tablet (7.5 mg total) by mouth daily for 15 days.  . Multiple Vitamins-Minerals (PRESERVISION AREDS 2) CAPS Take 1 capsule by mouth 2 (two) times daily.  . Omega-3 Fatty Acids (FISH OIL) 1000 MG CAPS Take 1,000 mg by mouth 2 (two) times daily.    No facility-administered encounter medications on file as of 08/15/2020.    Allergies (verified) Patient has no known allergies.   History: Past Medical History:  Diagnosis Date  . Actinic keratosis 10/18/2012  . 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   . Atrophy, Fuchs' 12/10/2011  . 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 Cervicalgia 04/25/2013  . 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 Cyclitis, chronic 11/18/2012  . Deafness 10/18/2012  . Endothelial  corneal dystrophy   . Essential hypertension 10/18/2012  . Hammer toe 04/24/2014  . Hyperlipidemia 10/18/2012  . Intermediate stage nonexudative age-related macular degeneration of both eyes 04/16/2016  . Nocturia 05/19/2016  . Occlusion and stenosis of carotid artery with cerebral infarction   . Other specified cardiac dysrhythmias(427.89)   . Pain in joint, pelvic region and thigh   . Paresthesia  04/30/2015   Toes of both feet   . Peripheral vascular disease, unspecified ([place])   . Primary open angle glaucoma 09/25/2011  . 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  . Rosacea   . Seborrheic keratosis 04/24/2014  . Syncope 06/27/2013  . TIA (transient ischemic attack) 10/18/2012   States he has had about 5  times  . Urine frequency    Past Surgical History:  Procedure Laterality Date  . CATARACT EXTRACTION  2005   bilateral  . CORNEAL TRANSPLANT  2010 left; 2013 right eye  . HERNIA REPAIR  1985   Left  . HERNIA REPAIR  03/2004   Right  . SHOULDER DEBRIDEMENT Left 2005  . TONSILLECTOMY Bilateral childhood  . TRANSURETHRAL RESECTION OF PROSTATE N/A 10/2003  . VASECTOMY     Family History  Problem Relation Age of Onset  . Cancer Mother   . Heart disease Father    Social History   Socioeconomic History  . Marital status: Widowed    Spouse name: Not on file  . Number of children: Not on file  . Years of education: Not on file  . Highest education level: Not on file  Occupational History  . Occupation: retired Insurance claims handler  Tobacco Use  . Smoking status: Former Smoker    Packs/day: 1.00    Years: 25.00    Pack years: 25.00    Quit date: 10/18/1964    Years since quitting: 55.8  . Smokeless tobacco: Never Used  . Tobacco comment: quit 1966  Vaping Use  . Vaping Use: Never used  Substance and Sexual Activity  . Alcohol use: No    Alcohol/week: 1.0 standard drink    Types: 1 Glasses of wine per week    Comment: very seldom  . Drug use: No  . Sexual activity: Not on file  Other Topics Concern  . Not on file  Social History Narrative   Lives at [facility] since 08/21/2011   Married to Hansell Will   Exercise: 3 x week bicycle   Previously smoked stopped 1966   Does not drink cafferinated beverage   Drinks one small glass of wine few nights a week    Social Determinants of Health   Financial Resource Strain: Not on file  Food Insecurity: Not on file   Transportation Needs: Not on file  Physical Activity: Not on file  Stress: Not on file  Social Connections: Not on file    Tobacco Counseling Counseling given: Not Answered Comment: quit 1966   Clinical Intake:     Pain : No/denies pain     BMI - recorded: 24 Nutritional Status: BMI of 19-24  Normal Nutritional Risks: None Diabetes: No  How often do you need to have someone help you when you read instructions, pamphlets, or other written materials from your doctor or pharmacy?: 1 - Never  Diabetic?no         Activities of Daily Living In your present state of health, do you have any difficulty performing the following activities: 08/15/2020  Hearing? Y  Vision? N  Difficulty concentrating or making  decisions? N  Walking or climbing stairs? N  Dressing or bathing? N  Doing errands, shopping? N  Preparing Food and eating ? N  Using the Toilet? N  In the past six months, have you accidently leaked urine? N  Do you have problems with loss of bowel control? N  Managing your Medications? N  Managing your Finances? N  Housekeeping or managing your Housekeeping? N  Some recent data might be hidden    Patient Care Team: Virgie Dad, MD as PCP - General (Internal Medicine) [place], [facility] Crista Luria, MD as Consulting Physician (Dermatology) Marilynne Halsted, MD as Referring Physician (Ophthalmology) Bond, Tracie Harrier, MD as Referring Physician (Ophthalmology) Virgie Dad, MD as Referring Physician (Internal Medicine)  Indicate any recent Medical Services you may have received from other than [facility] providers in the past year (date may be approximate).     Assessment:   This is a routine wellness examination for [place].  Hearing/Vision screen  Hearing Screening   125Hz  250Hz  500Hz  1000Hz  2000Hz  3000Hz  4000Hz  6000Hz  8000Hz   Right ear:           Left ear:           Comments: Patient wears hearing aids.   Vision Screening Comments: Patient has  vision problems. Patient sees eye doctor once a year. Patient [facility]. Sees Dr. Shea Stakes  Dietary issues and exercise activities discussed: Current Exercise Habits: The patient does not participate in regular exercise at present  Goals    . Patient Stated     To get back into routine exercise       Depression Screen PHQ 2/9 Scores 08/15/2020 06/29/2018 07/05/2017 06/25/2017 05/03/2017 04/05/2017 01/25/2017  PHQ - 2 Score 0 0 0 0 0 5 0  PHQ- 9 Score - - - - 0 9 -    Fall Risk Fall Risk  08/15/2020 08/07/2020 05/15/2020 03/13/2020 01/17/2020  Falls in the past year? 1 0 0 0 0  Number falls in past yr: 1 0 0 0 0  Injury with Fall? 0 - - - -    FALL RISK PREVENTION PERTAINING TO THE HOME:  Any stairs in or around the home? No  If so, are there any without handrails? No  Home free of loose throw rugs in walkways, pet beds, electrical cords, etc? Yes  Adequate lighting in your home to reduce risk of falls? Yes   ASSISTIVE DEVICES UTILIZED TO PREVENT FALLS:  Life alert? No  Use of a cane, walker or w/c? Yes  Grab bars in the bathroom? No  Shower chair or bench in shower? Yes  Elevated toilet seat or a handicapped toilet? No   TIMED UP AND GO:  Was the test performed? No .  L  Cognitive Function: MMSE - Mini Mental State Exam 09/13/2019 05/03/2018 05/03/2017 05/19/2016 10/30/2014  Not completed: - - (No Data) - -  Orientation to time 5 5 5 5 5   Orientation to Place 5 5 5 5 5   Registration 3 3 3 3 3   Attention/ Calculation 5 4 5 5 5   Recall 3 3 3 3 3   Language- name 2 objects 2 2 2 2 2   Language- repeat 1 1 1 1 1   Language- follow 3 step command 3 3 3 3 3   Language- read & follow direction 1 1 1 1 1   Write a sentence 1 1 1 1 1   Copy design 1 0 1 1 1  Total score 30 28 30 30 30      6CIT Screen 08/15/2020  What Year? 0 points  What month? 0 points  What time? 0 points  Count back from 20 0 points  Months in reverse 4 points  Repeat phrase 0  points  Total Score 4    Immunizations Immunization History  Administered Date(s) Administered  . Influenza Whole 04/19/2012, 04/20/2013  . Influenza, High Dose Seasonal PF 05/03/2019  . Influenza,inj,Quad PF,6+ Mos 04/21/2018  . Influenza-Unspecified 05/03/2014, 04/18/2015, 04/30/2016, 04/18/2017, 04/09/2020  . Moderna Sars-Covid-2 Vaccination 07/24/2019, 08/21/2019, 06/03/2020  . Pneumococcal Conjugate-13 03/20/2010  . Pneumococcal Polysaccharide-23 11/02/2017  . Td 02/24/2018  . Tdap 06/27/2011  . Zoster Recombinat (Shingrix) 12/24/2017, 03/09/2018    TDAP status: Up to date  Flu Vaccine status: Up to date  Pneumococcal vaccine status: Up to date  Covid-19 vaccine status: Completed vaccines  Qualifies for Shingles Vaccine? Yes   Zostavax completed No   Shingrix Completed?: Yes  Screening Tests Health Maintenance  Topic Date Due  . COVID-19 Vaccine (4 - Booster for Moderna series) 12/01/2020  . TETANUS/TDAP  02/25/2028  . INFLUENZA VACCINE  Completed  . PNA vac Low Risk Adult  Completed    Health Maintenance  There are no preventive care reminders to display for this patient.  Colorectal cancer screening: No longer required.   Lung Cancer Screening: (Low Dose CT Chest recommended if Age 19-80 years, 30 pack-year currently smoking OR have quit w/in 15years.) does not qualify.    Additional Screening:  Hepatitis C Screening: does not qualify  Vision Screening: Recommended annual ophthalmology exams for early detection of glaucoma and other disorders of the eye. Is the patient up to date with their annual eye exam?  Yes  Who is the provider or what [facility] If pt is not established with a provider, would they like to be referred to a provider to establish care? No .   Dental Screening: Recommended annual dental exams for proper oral hygiene  Community Resource Referral / Chronic Care Management: CRR required this visit?  No   CCM required this visit?   No      Plan:     I have personally reviewed and noted the following in the patient's chart:   . Medical and social history . Use of alcohol, tobacco or illicit drugs  . Current medications and supplements . Functional ability and status . Nutritional status . Physical activity . Advanced directives . List of other physicians . Hospitalizations, surgeries, and ER visits in previous 12 months . Vitals . Screenings to include cognitive, depression, and falls . Referrals and appointments  In addition, I have reviewed and discussed with patient certain preventive protocols, quality metrics, and best practice recommendations. A written personalized care plan for preventive services as well as general preventive health recommendations were provided to patient.     Lauree Chandler, NP   08/15/2020    Virtual Visit via Telephone Note  I connected with@ on 08/15/20 at  9:00 AM EST by telephone and verified that I am speaking with the correct person using two identifiers.  Location: Patient: home Provider: [facility]   I discussed the limitations, risks, security and privacy concerns of performing an evaluation and management service by telephone and the availability of in person appointments. I also discussed with the patient that there may be a patient responsible charge related to this service. The patient expressed understanding and agreed to proceed.   I discussed the  assessment and treatment plan with the patient. The patient was provided an opportunity to ask questions and all were answered. The patient agreed with the plan and demonstrated an understanding of the instructions.   The patient was advised to call back or seek an in-person evaluation if the symptoms worsen or if the condition fails to improve as anticipated.  I provided 18 minutes of non-face-to-face time during this encounter.  Carlos American. Harle Battiest Avs printed and mailed

## 2020-08-15 NOTE — Patient Instructions (Signed)
Russell [name] , Thank you for taking time to come for your Medicare Wellness Visit. I appreciate your ongoing commitment to your health goals. Please review the following plan we discussed and let me know if I can assist you in the future.   Screening recommendations/referrals: Colonoscopy aged out Recommended yearly ophthalmology/optometry visit for glaucoma screening and checkup Recommended yearly dental visit for hygiene and checkup  Vaccinations: Influenza vaccine up to date Pneumococcal vaccine up to date Tdap vaccine up date  Shingles vaccine up to date  Advanced directives: on file.   Conditions/risks identified: advanced age. falls  Next appointment: yearly   Preventive Care 65 Years and Older, Male Preventive care refers to lifestyle choices and visits with your health care provider that can promote health and wellness. What does preventive care include?  A yearly physical exam. This is also called an annual well check.  Dental exams once or twice a year.  Routine eye exams. Ask your health care provider how often you should have your eyes checked.  Personal lifestyle choices, including:  Daily care of your teeth and gums.  Regular physical activity.  Eating a healthy diet.  Avoiding tobacco and drug use.  Limiting alcohol use.  Practicing safe sex.  Taking low doses of aspirin every day.  Taking vitamin and mineral supplements as recommended by your health care provider. What happens during an annual well check? The services and screenings done by your health care provider during your annual well check will depend on your age, overall health, lifestyle risk factors, and family history of disease. Counseling  Your health care provider may ask you questions about your:  Alcohol use.  Tobacco use.  Drug use.  Emotional well-being.  Home and relationship well-being.  Sexual activity.  Eating habits.  History of falls.  Memory and ability to  understand (cognition).  Work and work Statistician. Screening  You may have the following tests or measurements:  Height, weight, and BMI.  Blood pressure.  Lipid and cholesterol levels. These may be checked every 5 years, or more frequently if you are over 61 years old.  Skin check.  Lung cancer screening. You may have this screening every year starting at age 65 if you have a 30-pack-year history of smoking and currently smoke or have quit within the past 15 years.  Fecal occult blood test (FOBT) of the stool. You may have this test every year starting at age 55.  Flexible sigmoidoscopy or colonoscopy. You may have a sigmoidoscopy every 5 years or a colonoscopy every 10 years starting at age 65.  Prostate cancer screening. Recommendations will vary depending on your family history and other risks.  Hepatitis C blood test.  Hepatitis B blood test.  Sexually transmitted disease (STD) testing.  Diabetes screening. This is done by checking your blood sugar (glucose) after you have not eaten for a while (fasting). You may have this done every 1-3 years.  Abdominal aortic aneurysm (AAA) screening. You may need this if you are a current or former smoker.  Osteoporosis. You may be screened starting at age 10 if you are at high risk. Talk with your health care provider about your test results, treatment options, and if necessary, the need for more tests. Vaccines  Your health care provider may recommend certain vaccines, such as:  Influenza vaccine. This is recommended every year.  Tetanus, diphtheria, and acellular pertussis (Tdap, Td) vaccine. You may need a Td booster every 10 years.  Zoster vaccine. You may need  this after age 41.  Pneumococcal 13-valent conjugate (PCV13) vaccine. One dose is recommended after age 8.  Pneumococcal polysaccharide (PPSV23) vaccine. One dose is recommended after age 22. Talk to your health care provider about which screenings and vaccines you  need and how often you need them. This information is not intended to replace advice given to you by your health care provider. Make sure you discuss any questions you have with your health care provider. Document Released: 08/02/2015 Document Revised: 03/25/2016 Document Reviewed: 05/07/2015 Elsevier Interactive Patient Education  2017 [place] Prevention in the Home Falls can cause injuries. They can happen to people of all ages. There are many things you can do to make your home safe and to help prevent falls. What can I do on the outside of my home?  Regularly fix the edges of walkways and driveways and fix any cracks.  Remove anything that might make you trip as you walk through a door, such as a raised step or threshold.  Trim any bushes or trees on the path to your home.  Use bright outdoor lighting.  Clear any walking paths of anything that might make someone trip, such as rocks or tools.  Regularly check to see if handrails are loose or broken. Make sure that both sides of any steps have handrails.  Any raised decks and porches should have guardrails on the edges.  Have any leaves, snow, or ice cleared regularly.  Use sand or salt on walking paths during winter.  Clean up any spills in your garage right away. This includes oil or grease spills. What can I do in the bathroom?  Use night lights.  Install grab bars by the toilet and in the tub and shower. Do not use towel bars as grab bars.  Use non-skid mats or decals in the tub or shower.  If you need to sit down in the shower, use a plastic, non-slip stool.  Keep the floor dry. Clean up any water that spills on the floor as soon as it happens.  Remove soap buildup in the tub or shower regularly.  Attach bath mats securely with double-sided non-slip rug tape.  Do not have throw rugs and other things on the floor that can make you trip. What can I do in the bedroom?  Use night lights.  Make sure  that you have a light by your bed that is easy to reach.  Do not use any sheets or blankets that are too big for your bed. They should not hang down onto the floor.  Have a firm chair that has side arms. You can use this for support while you get dressed.  Do not have throw rugs and other things on the floor that can make you trip. What can I do in the kitchen?  Clean up any spills right away.  Avoid walking on wet floors.  Keep items that you use a lot in easy-to-reach places.  If you need to reach something above you, use a strong step stool that has a grab bar.  Keep electrical cords out of the way.  Do not use floor polish or wax that makes floors slippery. If you must use wax, use non-skid floor wax.  Do not have throw rugs and other things on the floor that can make you trip. What can I do with my stairs?  Do not leave any items on the stairs.  Make sure that there are handrails on both sides of  the stairs and use them. Fix handrails that are broken or loose. Make sure that handrails are as long as the stairways.  Check any carpeting to make sure that it is firmly attached to the stairs. Fix any carpet that is loose or worn.  Avoid having throw rugs at the top or bottom of the stairs. If you do have throw rugs, attach them to the floor with carpet tape.  Make sure that you have a light switch at the top of the stairs and the bottom of the stairs. If you do not have them, ask someone to add them for you. What else can I do to help prevent falls?  Wear shoes that:  Do not have high heels.  Have rubber bottoms.  Are comfortable and fit you well.  Are closed at the toe. Do not wear sandals.  If you use a stepladder:  Make sure that it is fully opened. Do not climb a closed stepladder.  Make sure that both sides of the stepladder are locked into place.  Ask someone to hold it for you, if possible.  Clearly mark and make sure that you can see:  Any grab bars or  handrails.  First and last steps.  Where the edge of each step is.  Use tools that help you move around (mobility aids) if they are needed. These include:  Canes.  Walkers.  Scooters.  Crutches.  Turn on the lights when you go into a dark area. Replace any light bulbs as soon as they burn out.  Set up your furniture so you have a clear path. Avoid moving your furniture around.  If any of your floors are uneven, fix them.  If there are any pets around you, be aware of where they are.  Review your medicines with your doctor. Some medicines can make you feel dizzy. This can increase your chance of falling. Ask your doctor what other things that you can do to help prevent falls. This information is not intended to replace advice given to you by your health care provider. Make sure you discuss any questions you have with your health care provider. Document Released: 05/02/2009 Document Revised: 12/12/2015 Document Reviewed: 08/10/2014 Elsevier Interactive Patient Education  2017 [employer].

## 2020-08-15 NOTE — Progress Notes (Signed)
This service is provided via telemedicine  No vital signs collected/recorded due to the encounter was a telemedicine visit.   Location of patient (ex: home, work):  Home  Patient consents to a telephone visit:  Yes, see encounter dated 08/15/2020  Location of the provider (ex: office, home):  [place]  Name of any referring provider: Veleta Miners, MD  Names of all persons participating in the telemedicine service and their role in the encounter:  Sherrie Mustache, Nurse Practitioner, Carroll Kinds, CMA, and patient.   Time spent on call: 12 minutes with medical assistant

## 2020-08-15 NOTE — Progress Notes (Deleted)
Subjective:   Russell [name] is a 87 y.o. male who presents for a Welcome to Medicare exam.   Review of Systems: ***       Objective:    There were no vitals filed for this visit. There is no height or weight on file to calculate BMI.  Medications Outpatient Encounter Medications as of 08/15/2020  Medication Sig  . amLODipine (NORVASC) 5 MG tablet Take 1 tablet (5 mg total) by mouth daily.  Marland Kitchen aspirin 325 MG tablet Take 325 mg by mouth every evening.  Marland Kitchen atorvastatin (LIPITOR) 40 MG tablet Take one tablet by mouth once daily  . DORZOLAMIDE HCL-TIMOLOL MAL OP Apply 1 drop to eye 2 (two) times daily.  . enalapril (VASOTEC) 20 MG tablet TAKE 1 TABLET TWICE A DAY TO CONTROL BLOOD PRESSURE  . loratadine (CLARITIN) 10 MG tablet Take 10 mg by mouth daily.  . meloxicam (MOBIC) 7.5 MG tablet Take 1 tablet (7.5 mg total) by mouth daily for 15 days.  . Multiple Vitamins-Minerals (PRESERVISION AREDS 2) CAPS Take 1 capsule by mouth 2 (two) times daily.  . Omega-3 Fatty Acids (FISH OIL) 1000 MG CAPS Take 1,000 mg by mouth 2 (two) times daily.    No facility-administered encounter medications on file as of 08/15/2020.     History: Past Medical History:  Diagnosis Date  . Actinic keratosis 10/18/2012  . 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   . Atrophy, Fuchs' 12/10/2011  . 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 Cervicalgia 04/25/2013  . 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 Cyclitis, chronic 11/18/2012  . Deafness 10/18/2012  . Endothelial corneal dystrophy   . Essential hypertension 10/18/2012  . Hammer toe 04/24/2014  . Hyperlipidemia 10/18/2012  . Intermediate stage nonexudative age-related macular degeneration of both eyes 04/16/2016  . Nocturia 05/19/2016  . Occlusion and stenosis of carotid artery with cerebral infarction   . Other specified cardiac dysrhythmias(427.89)   . Pain in joint, pelvic region and thigh   . Paresthesia  04/30/2015   Toes of both feet   . Peripheral vascular disease, unspecified ([place])   . Primary open angle glaucoma 09/25/2011  . 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  . Rosacea   . Seborrheic keratosis 04/24/2014  . Syncope 06/27/2013  . TIA (transient ischemic attack) 10/18/2012   States he has had about 5  times  . Urine frequency    Past Surgical History:  Procedure Laterality Date  . CATARACT EXTRACTION  2005   bilateral  . CORNEAL TRANSPLANT  2010 left; 2013 right eye  . HERNIA REPAIR  1985   Left  . HERNIA REPAIR  03/2004   Right  . SHOULDER DEBRIDEMENT Left 2005  . TONSILLECTOMY Bilateral childhood  . TRANSURETHRAL RESECTION OF PROSTATE N/A 10/2003  . VASECTOMY      Family History  Problem Relation Age of Onset  . Cancer Mother   . Heart disease Father    Social History   Occupational History  . Occupation: retired Insurance claims handler  Tobacco Use  . Smoking status: Former Smoker    Packs/day: 1.00    Years: 25.00    Pack years: 25.00    Quit date: 10/18/1964    Years since quitting: 55.8  . Smokeless tobacco: Never Used  . Tobacco comment: quit 1966  Vaping Use  . Vaping Use: Never used  Substance and Sexual Activity  . Alcohol use: No    Alcohol/week: 1.0 standard drink  Types: 1 Glasses of wine per week    Comment: very seldom  . Drug use: No  . Sexual activity: Not on file   Tobacco Counseling Counseling given: Not Answered Comment: quit 1966   Immunizations and Health Maintenance Immunization History  Administered Date(s) Administered  . Influenza Whole 04/19/2012, 04/20/2013  . Influenza, High Dose Seasonal PF 05/03/2019  . Influenza,inj,Quad PF,6+ Mos 04/21/2018  . Influenza-Unspecified 05/03/2014, 04/18/2015, 04/30/2016, 04/18/2017, 04/09/2020  . Moderna Sars-Covid-2 Vaccination 07/24/2019, 08/21/2019, 06/03/2020  . Pneumococcal Conjugate-13 03/20/2010  . Pneumococcal Polysaccharide-23 11/02/2017  . Td 02/24/2018  . Tdap 06/27/2011  . Zoster Recombinat  (Shingrix) 12/24/2017, 03/09/2018   There are no preventive care reminders to display for this patient.  Activities of Daily Living No flowsheet data found.  Physical Exam  ***(optional), or other factors deemed appropriate based on the beneficiary's medical and social history and current clinical standards.  Advanced Directives: Does Patient Have a Medical Advance Directive?: Yes Type of Advance Directive: Healthcare Power of [place] of facility DNR (pink MOST or yellow form) Does patient want to make changes to medical advance directive?: No - Patient declined Copy of [place] in Chart?: Yes - validated most recent copy scanned in chart (See row information)    Assessment:    This is a routine wellness  examination for this patient . ***  Vision/Hearing screen  Hearing Screening   125Hz  250Hz  500Hz  1000Hz  2000Hz  3000Hz  4000Hz  6000Hz  8000Hz   Right ear:           Left ear:           Comments: Patient wears hearing aids.   Vision Screening Comments: Patient has vision problems. Patient sees eye doctor once a year. Patient [facility] to [facility] eye. Sees Dr. Shea Stakes  Dietary issues and exercise activities discussed:     Goals    . Patient Stated     Patient will work on Education officer, community.       Depression Screen PHQ 2/9 Scores 08/15/2020 06/29/2018 07/05/2017 06/25/2017  PHQ - 2 Score 0 0 0 0  PHQ- 9 Score - - - -     Fall Risk Fall Risk  08/15/2020  Falls in the past year? 1  Number falls in past yr: 1  Injury with Fall? 0    Cognitive Function MMSE - Mini Mental State Exam 09/13/2019 05/03/2018 05/03/2017 05/19/2016 10/30/2014  Not completed: - - (No Data) - -  Orientation to time 5 5 5 5 5   Orientation to Place 5 5 5 5 5   Registration 3 3 3 3 3   Attention/ Calculation 5 4 5 5 5   Recall 3 3 3 3 3   Language- name 2 objects 2 2 2 2 2   Language- repeat 1 1 1 1 1   Language- follow 3 step command 3 3 3 3 3   Language-  read & follow direction 1 1 1 1 1   Write a sentence 1 1 1 1 1   Copy design 1 0 1 1 1   Total score 30 28 30 30 30      6CIT Screen 08/15/2020  What Year? 0 points  What month? 0 points  What time? 3 points  Count back from 20 0 points  Months in reverse 4 points  Repeat phrase 0 points  Total Score 7    Patient Care Team: Virgie Dad, MD as PCP - General (Internal Medicine) [place], [facility] Crista Luria, MD as Consulting Physician (  Dermatology) Marilynne Halsted, MD as Referring Physician (Ophthalmology) Bond, Tracie Harrier, MD as Referring Physician (Ophthalmology) Virgie Dad, MD as Referring Physician (Internal Medicine)     Plan:   ***  I have personally reviewed and noted the following in the patient's chart:   . Medical and social history . Use of alcohol, tobacco or illicit drugs  . Current medications and supplements . Functional ability and status . Nutritional status . Physical activity . Advanced directives . List of other physicians . Hospitalizations, surgeries, and ER visits in previous 12 months . Vitals . Screenings to include cognitive, depression, and falls . Referrals and appointments  In addition, I have reviewed and discussed with patient certain preventive protocols, quality metrics, and best practice recommendations. A written personalized care plan for preventive services as well as general preventive health recommendations were provided to patient.    Lauree Chandler, NP 08/15/2020

## 2020-08-15 NOTE — Telephone Encounter (Signed)
Mr. jeptha, hinnenkamp are scheduled for a virtual visit with your provider today.    Just as we do with appointments in the office, we must obtain your consent to participate.  Your consent will be active for this visit and any virtual visit you may have with one of our providers in the next 365 days.    If you have a MyChart account, I can also send a copy of this consent to you electronically.  All virtual visits are billed to your insurance company just like a traditional visit in the office.  As this is a virtual visit, video technology does not allow for your provider to perform a traditional examination.  This may limit your provider's ability to fully assess your condition.  If your provider identifies any concerns that need to be evaluated in person or the need to arrange testing such as labs, EKG, etc, we will make arrangements to do so.    Although advances in technology are sophisticated, we cannot ensure that it will always work on either your end or our end.  If the connection with a video visit is poor, we may have to switch to a telephone visit.  With either a video or telephone visit, we are not always able to ensure that we have a secure connection.   I need to obtain your verbal consent now.   Are you willing to proceed with your visit today?   Cas Tracz has provided verbal consent on 08/15/2020 for a virtual visit (video or telephone).   Carroll Kinds, [facility] 08/15/2020  9:23 AM

## 2020-08-19 DIAGNOSIS — M545 Low back pain, unspecified: Secondary | ICD-10-CM | POA: Diagnosis not present

## 2020-09-04 ENCOUNTER — Other Ambulatory Visit: Payer: Self-pay

## 2020-09-04 ENCOUNTER — Encounter: Payer: Self-pay | Admitting: Internal Medicine

## 2020-09-04 ENCOUNTER — Non-Acute Institutional Stay: Payer: Medicare Other | Admitting: Internal Medicine

## 2020-09-04 VITALS — BP 158/76 | HR 67 | Temp 96.2°F | Ht 69.0 in | Wt 166.1 lb

## 2020-09-04 DIAGNOSIS — R2681 Unsteadiness on feet: Secondary | ICD-10-CM | POA: Diagnosis not present

## 2020-09-04 DIAGNOSIS — F320 Major depressive disorder, single episode, mild: Secondary | ICD-10-CM

## 2020-09-04 DIAGNOSIS — E785 Hyperlipidemia, unspecified: Secondary | ICD-10-CM

## 2020-09-04 DIAGNOSIS — M545 Low back pain, unspecified: Secondary | ICD-10-CM | POA: Diagnosis not present

## 2020-09-04 DIAGNOSIS — L719 Rosacea, unspecified: Secondary | ICD-10-CM

## 2020-09-04 DIAGNOSIS — I10 Essential (primary) hypertension: Secondary | ICD-10-CM

## 2020-09-04 DIAGNOSIS — G459 Transient cerebral ischemic attack, unspecified: Secondary | ICD-10-CM | POA: Diagnosis not present

## 2020-09-04 NOTE — Progress Notes (Signed)
Location: [place] of Service:  Clinic (12)  Provider:   Code Status:  Goals of Care:  Advanced Directives 08/15/2020  Does Patient Have a Medical Advance Directive? Yes  Type of Paramedic of [place];Living will;Out of facility DNR (pink MOST or yellow form)  Does patient want to make changes to medical advance directive? No - Patient declined  Copy of [place] in Chart? Yes - validated most recent copy scanned in chart (See row information)  Would patient like information on creating a medical advance directive? -  Pre-existing out of facility DNR order (yellow form or pink MOST form) -     Chief Complaint  Patient presents with  . Medical Management of Chronic Issues    Patient returns to the clinic for follow up.     HPI: Patient is a 87 y.o. male seen today for an acute visit for Follow up of his Back pain after the fall  Patient has h/o Hypertension and Depression, TIA, Skin cancer  He fell on Jan 14th Seems like mechanical Fall. Was sitting in his chair and fell backwards in his apartment Got up by himself. Did not feel he had any injuries But since then having Low back discomfort Was started on Meloxicam and tylenol for pain Told to start using Walker and therapy referal was made  He feels much better Pain is mostly resolved Some discomfort when he gets up in the morning Not taking Tylenol any more He says he is doing better with Therapy. Using walker but upset about it and wants to know if he can not use walker He still drive Past Medical History:  Diagnosis Date  . Actinic keratosis 10/18/2012  . 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   . Atrophy, Fuchs' 12/10/2011  . 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 Cervicalgia 04/25/2013  . 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 Cyclitis, chronic 11/18/2012  . Deafness 10/18/2012  . Endothelial  corneal dystrophy   . Essential hypertension 10/18/2012  . Hammer toe 04/24/2014  . Hyperlipidemia 10/18/2012  . Intermediate stage nonexudative age-related macular degeneration of both eyes 04/16/2016  . Nocturia 05/19/2016  . Occlusion and stenosis of carotid artery with cerebral infarction   . Other specified cardiac dysrhythmias(427.89)   . Pain in joint, pelvic region and thigh   . Paresthesia 04/30/2015   Toes of both feet   . Peripheral vascular disease, unspecified ([place])   . Primary open angle glaucoma 09/25/2011  . 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  . Rosacea   . Seborrheic keratosis 04/24/2014  . Syncope 06/27/2013  . TIA (transient ischemic attack) 10/18/2012   States he has had about 5  times  . Urine frequency     Past Surgical History:  Procedure Laterality Date  . CATARACT EXTRACTION  2005   bilateral  . CORNEAL TRANSPLANT  2010 left; 2013 right eye  . HERNIA REPAIR  1985   Left  . HERNIA REPAIR  03/2004   Right  . SHOULDER DEBRIDEMENT Left 2005  . TONSILLECTOMY Bilateral childhood  . TRANSURETHRAL RESECTION OF PROSTATE N/A 10/2003  . VASECTOMY      No Known Allergies  Outpatient Encounter Medications as of 09/04/2020  Medication Sig  . amLODipine (NORVASC) 5 MG tablet Take 1 tablet (5 mg total) by mouth daily.  Marland Kitchen aspirin 325 MG tablet Take 325 mg by mouth every evening.  Marland Kitchen atorvastatin (LIPITOR) 40  MG tablet Take one tablet by mouth once daily  . DORZOLAMIDE HCL-TIMOLOL MAL OP Apply 1 drop to eye 2 (two) times daily.  . enalapril (VASOTEC) 20 MG tablet TAKE 1 TABLET TWICE A DAY TO CONTROL BLOOD PRESSURE  . loratadine (CLARITIN) 10 MG tablet Take 10 mg by mouth daily.  . Multiple Vitamins-Minerals (PRESERVISION AREDS 2) CAPS Take 1 capsule by mouth 2 (two) times daily.  . Omega-3 Fatty Acids (FISH OIL) 1000 MG CAPS Take 1,000 mg by mouth 2 (two) times daily.    No facility-administered encounter medications on file as of 09/04/2020.    Review of Systems:  Review of Systems   Review of Systems  Constitutional: Negative for activity change, appetite change, chills, diaphoresis, fatigue and fever.  HENT: Negative for mouth sores, postnasal drip, rhinorrhea, sinus pain and sore throat.   Respiratory: Negative for apnea, cough, chest tightness, shortness of breath and wheezing.   Cardiovascular: Negative for chest pain, palpitations and leg swelling.  Gastrointestinal: Negative for abdominal distention, abdominal pain, constipation, diarrhea, nausea and vomiting.  Genitourinary: Negative for dysuria and frequency.  Musculoskeletal: Negative for arthralgias, joint swelling and myalgias.  Skin: Negative for rash.  Neurological: Negative for dizziness, syncope, weakness, light-headedness and numbness.  Psychiatric/Behavioral: Negative for behavioral problems, confusion and sleep disturbance.     Health Maintenance  Topic Date Due  . COVID-19 Vaccine (4 - Booster for Moderna series) 12/01/2020  . TETANUS/TDAP  02/25/2028  . INFLUENZA VACCINE  Completed  . PNA vac Low Risk Adult  Completed    Physical Exam: Vitals:   09/04/20 1506  BP: (!) 158/76  Pulse: 67  Temp: (!) 96.2 F (35.7 C)  SpO2: 97%  Weight: 166 lb 1.6 oz (75.3 kg)  Height: 5\' 9"  (1.753 m)   Body mass index is 24.53 kg/m. Physical Exam  Constitutional: Oriented to person, place, and time. Well-developed and well-nourished.  HENT:  Head: Normocephalic.  Mouth/Throat: Oropharynx is clear and moist.  Eyes: Pupils are equal, round, and reactive to light.  Neck: Neck supple.  Cardiovascular: Normal rate and normal heart sounds.  No murmur heard. Pulmonary/Chest: Effort normal and breath sounds normal. No respiratory distress. No wheezes. She has no rales.  Abdominal: Soft. Bowel sounds are normal. No distension. There is no tenderness. There is no rebound.  Musculoskeletal: No edema.  Lymphadenopathy: none Neurological: Alert and oriented to person, place, and time.  Straight leg was  negative No Point tenderness in his spine Just generalized discomfort in lower back But much improved Gait not stable without the walker  Skin: Skin is warm and dry.  Rosacea rash present on face Psychiatric: Normal mood and affect. Behavior is normal. Thought content normal.    Labs reviewed: Basic Metabolic Panel: Recent Labs    01/10/20 0821 05/09/20 0810  NA 140 140  K 4.1 4.2  CL 107 106  CO2 28 27  GLUCOSE 86 85  BUN 22 20  CREATININE 0.87 0.84  CALCIUM 8.5* 8.6  TSH  --  2.38   Liver Function Tests: Recent Labs    01/10/20 0821 05/09/20 0810  AST 19 19  ALT 15 18  BILITOT 0.9 0.9  PROT 5.5* 5.9*   No results for input(s): LIPASE, AMYLASE in the last 8760 hours. No results for input(s): AMMONIA in the last 8760 hours. CBC: Recent Labs    01/10/20 0821 05/09/20 0810  WBC 7.6 7.4  NEUTROABS 5,046  --   HGB 14.6 14.9  HCT 42.1 43.5  MCV 87.9 89.5  PLT 179 187   Lipid Panel: Recent Labs    05/09/20 0810  CHOL 123  HDL 43  LDLCALC 67  TRIG 54  CHOLHDL 2.9   No results found for: HGBA1C  Procedures since last visit: No results found.  Assessment/Plan 1. Acute bilateral low back pain without sciatica s/p Fall Doing better with therapy Talked to therapy they said he does not have any pain just Balancing issues Reinforced him to continue using walker Use tylenol PRN for Pain  2. Essential hypertension Doing well on Norvasc and Vasotec  3. TIA (transient ischemic attack) Full dose of Aspirin and statin  4. Depression, major, single episode, mild (HCC) Refuses Antidepressants  5. Hyperlipidemia, unspecified hyperlipidemia type On statin  6. Unstable gait Keep using Walker Working with therapy  7. Rosacea Use Sunscreen. Refuses anything else    Labs/tests ordered:  * No order type specified * Next appt:  10/30/2020

## 2020-09-11 ENCOUNTER — Encounter: Payer: Self-pay | Admitting: Internal Medicine

## 2020-09-16 DIAGNOSIS — H401131 Primary open-angle glaucoma, bilateral, mild stage: Secondary | ICD-10-CM | POA: Diagnosis not present

## 2020-09-16 DIAGNOSIS — Z961 Presence of intraocular lens: Secondary | ICD-10-CM | POA: Diagnosis not present

## 2020-09-21 ENCOUNTER — Other Ambulatory Visit: Payer: Self-pay | Admitting: Internal Medicine

## 2020-10-04 DIAGNOSIS — M79672 Pain in left foot: Secondary | ICD-10-CM | POA: Diagnosis not present

## 2020-10-04 DIAGNOSIS — B351 Tinea unguium: Secondary | ICD-10-CM | POA: Diagnosis not present

## 2020-10-04 DIAGNOSIS — M79671 Pain in right foot: Secondary | ICD-10-CM | POA: Diagnosis not present

## 2020-10-30 ENCOUNTER — Encounter: Payer: Self-pay | Admitting: Internal Medicine

## 2020-10-30 ENCOUNTER — Other Ambulatory Visit: Payer: Self-pay

## 2020-10-30 ENCOUNTER — Non-Acute Institutional Stay: Payer: Medicare Other | Admitting: Internal Medicine

## 2020-10-30 VITALS — BP 128/70 | HR 71 | Temp 95.9°F | Ht 69.0 in | Wt 167.9 lb

## 2020-10-30 DIAGNOSIS — G8929 Other chronic pain: Secondary | ICD-10-CM

## 2020-10-30 DIAGNOSIS — G459 Transient cerebral ischemic attack, unspecified: Secondary | ICD-10-CM

## 2020-10-30 DIAGNOSIS — E785 Hyperlipidemia, unspecified: Secondary | ICD-10-CM

## 2020-10-30 DIAGNOSIS — R2681 Unsteadiness on feet: Secondary | ICD-10-CM | POA: Diagnosis not present

## 2020-10-30 DIAGNOSIS — I10 Essential (primary) hypertension: Secondary | ICD-10-CM | POA: Diagnosis not present

## 2020-10-30 DIAGNOSIS — M545 Low back pain, unspecified: Secondary | ICD-10-CM

## 2020-10-30 DIAGNOSIS — L719 Rosacea, unspecified: Secondary | ICD-10-CM

## 2020-10-30 DIAGNOSIS — F320 Major depressive disorder, single episode, mild: Secondary | ICD-10-CM | POA: Diagnosis not present

## 2020-10-30 NOTE — Progress Notes (Signed)
Location:  [place] of Service:  Clinic (12)  Provider:   Code Status:  Goals of Care:  Advanced Directives 08/15/2020  Does Patient Have a Medical Advance Directive? Yes  Type of Paramedic of [place];Living will;Out of facility DNR (pink MOST or yellow form)  Does patient want to make changes to medical advance directive? No - Patient declined  Copy of [place] in Chart? Yes - validated most recent copy scanned in chart (See row information)  Would patient like information on creating a medical advance directive? -  Pre-existing out of facility DNR order (yellow form or pink MOST form) -     Chief Complaint  Patient presents with  . Medical Management of Chronic Issues    Patient returns to the clinic for follow up.    HPI: Patient is a 87 y.o. male seen today for medical management of chronic diseases.    Patient has h/o Hypertension and Depression, TIA, Skin cancer  Had Mechanical  Fall in Jan . Followed by Low back pain Treated with Meloxicam and tylenol  Also got therapy Has done well since then. Walks with his walker now and uses cane in his room Says he still feels pain in his low back when he gets up in morning  Still drives but asking his son to take over his car Cognition MMSE 30/30 Has sometimes issues with words History of TIA versus migraine Had detailed work-upbyneurology for his atypical symptoms of visual impairment. With Aphasia and Word finding.He has had detail work ups including EEG, Echo and MRI .which have been Negative. They have recommended Asprin, BP control and Statin. No Episodes recently   Past Medical History:  Diagnosis Date  . Actinic keratosis 10/18/2012  . 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   . Atrophy, Fuchs' 12/10/2011  . 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 Cervicalgia 04/25/2013  . 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 Cyclitis, chronic 11/18/2012  . Deafness 10/18/2012  . Endothelial corneal dystrophy   . Essential hypertension 10/18/2012  . Hammer toe 04/24/2014  . Hyperlipidemia 10/18/2012  . Intermediate stage nonexudative age-related macular degeneration of both eyes 04/16/2016  . Nocturia 05/19/2016  . Occlusion and stenosis of carotid artery with cerebral infarction   . Other specified cardiac dysrhythmias(427.89)   . Pain in joint, pelvic region and thigh   . Paresthesia 04/30/2015   Toes of both feet   . Peripheral vascular disease, unspecified ([place])   . Primary open angle glaucoma 09/25/2011  . 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  . Rosacea   . Seborrheic keratosis 04/24/2014  . Syncope 06/27/2013  . TIA (transient ischemic attack) 10/18/2012   States he has had about 5  times  . Urine frequency     Past Surgical History:  Procedure Laterality Date  . CATARACT EXTRACTION  2005   bilateral  . CORNEAL TRANSPLANT  2010 left; 2013 right eye  . HERNIA REPAIR  1985   Left  . HERNIA REPAIR  03/2004   Right  . SHOULDER DEBRIDEMENT Left 2005  . TONSILLECTOMY Bilateral childhood  . TRANSURETHRAL RESECTION OF PROSTATE N/A 10/2003  . VASECTOMY      No Known Allergies  Outpatient Encounter Medications as of 10/30/2020  Medication Sig  . amLODipine (NORVASC) 5 MG tablet Take 1 tablet (5 mg total) by mouth daily.  Marland Kitchen aspirin 325 MG tablet Take 325 mg by mouth  every evening.  Marland Kitchen atorvastatin (LIPITOR) 40 MG tablet Take one tablet by mouth once daily  . DORZOLAMIDE HCL-TIMOLOL MAL OP Apply 1 drop to eye 2 (two) times daily.  . enalapril (VASOTEC) 20 MG tablet TAKE 1 TABLET TWICE A DAY TO CONTROL BLOOD PRESSURE  . loratadine (CLARITIN) 10 MG tablet Take 10 mg by mouth daily.  . Multiple Vitamins-Minerals (PRESERVISION AREDS 2) CAPS Take 1 capsule by mouth 2 (two) times daily.  . Omega-3 Fatty Acids (FISH OIL) 1000 MG CAPS Take 1,000 mg by mouth 2 (two) times daily.    No facility-administered  encounter medications on file as of 10/30/2020.    Review of Systems:  Review of Systems  Review of Systems  Constitutional: Negative for activity change, appetite change, chills, diaphoresis, fatigue and fever.  HENT: Negative for mouth sores, postnasal drip, rhinorrhea, sinus pain and sore throat.   Respiratory: Negative for apnea, cough, chest tightness, shortness of breath and wheezing.   Cardiovascular: Negative for chest pain, palpitations and leg swelling.  Gastrointestinal: Negative for abdominal distention, abdominal pain, constipation, diarrhea, nausea and vomiting.  Genitourinary: Negative for dysuria and frequency.  Musculoskeletal: positive  for arthralgias,  and myalgias.  Skin: Negative for rash.  Neurological: Negative for dizziness, syncope, weakness, light-headedness and numbness.  Psychiatric/Behavioral: Negative for behavioral problems, confusion and sleep disturbance.     Health Maintenance  Topic Date Due  . COVID-19 Vaccine (4 - Booster for Moderna series) 12/01/2020  . INFLUENZA VACCINE  02/17/2021  . TETANUS/TDAP  02/25/2028  . PNA vac Low Risk Adult  Completed  . HPV VACCINES  Aged Out    Physical Exam: Vitals:   10/30/20 1328  BP: 128/70  Pulse: 71  Temp: (!) 95.9 F (35.5 C)  SpO2: 97%  Weight: 167 lb 14.4 oz (76.2 kg)  Height: 5\' 9"  (1.753 m)   Body mass index is 24.79 kg/m. Physical Exam  Constitutional: Oriented to person, place, and time. Well-developed and well-nourished.  HENT:  Head: Normocephalic.  Mouth/Throat: Oropharynx is clear and moist.  Eyes: Pupils are equal, round, and reactive to light.  Neck: Neck supple.  Cardiovascular: Normal rate and normal heart sounds.  No murmur heard. Pulmonary/Chest: Effort normal and breath sounds normal. No respiratory distress. No wheezes. She has no rales.  Abdominal: Soft. Bowel sounds are normal. No distension. There is no tenderness. There is no rebound.  Musculoskeletal: Mild Edema  Bilateral Left more then Right   Lymphadenopathy: none Neurological: Alert and oriented to person, place, and time.  Gait stable Skin: Skin is warm and dry.  Psychiatric: Normal mood and affect. Behavior is normal. Thought content normal.    Labs reviewed: Basic Metabolic Panel: Recent Labs    01/10/20 0821 05/09/20 0810  NA 140 140  K 4.1 4.2  CL 107 106  CO2 28 27  GLUCOSE 86 85  BUN 22 20  CREATININE 0.87 0.84  CALCIUM 8.5* 8.6  TSH  --  2.38   Liver Function Tests: Recent Labs    01/10/20 0821 05/09/20 0810  AST 19 19  ALT 15 18  BILITOT 0.9 0.9  PROT 5.5* 5.9*   No results for input(s): LIPASE, AMYLASE in the last 8760 hours. No results for input(s): AMMONIA in the last 8760 hours. CBC: Recent Labs    01/10/20 0821 05/09/20 0810  WBC 7.6 7.4  NEUTROABS 5,046  --   HGB 14.6 14.9  HCT 42.1 43.5  MCV 87.9 89.5  PLT 179 187  Lipid Panel: Recent Labs    05/09/20 0810  CHOL 123  HDL 43  LDLCALC 67  TRIG 54  CHOLHDL 2.9   No results found for: HGBA1C  Procedures since last visit: No results found.  Assessment/Plan 1. Essential hypertension BP doing well on Norvasc and Enalapril   - CBC with Differential/Platelet; Future - COMPLETE METABOLIC PANEL WITH GFR; Future  2. Chronic midline low back pain without sciatica Worked with therapy Does not need Tylenol or any other med Walking with this walker and Cane now  3. TIA (transient ischemic attack) On Statin and  ASpirin  4. Depression, major, single episode, mild (HCC) Refuses Meds. Say he is coping well Weight stable for now  - TSH; Future  5. Hyperlipidemia, unspecified hyperlipidemia type On statin -   TSH; Future - Lipid panel; Future  6. Unstable gait Walking with the walker now Cane at home No Recent falls   7. Rosacea Only uses Sunscreen   Labs/tests ordered:  * No order type specified * Next appt:  04/24/2021

## 2020-11-11 ENCOUNTER — Other Ambulatory Visit: Payer: Self-pay | Admitting: Internal Medicine

## 2020-12-04 ENCOUNTER — Non-Acute Institutional Stay: Payer: Medicare Other | Admitting: Internal Medicine

## 2020-12-04 ENCOUNTER — Other Ambulatory Visit: Payer: Self-pay

## 2020-12-04 ENCOUNTER — Encounter: Payer: Self-pay | Admitting: Internal Medicine

## 2020-12-04 VITALS — BP 128/72 | HR 73 | Temp 96.0°F | Ht 69.0 in | Wt 166.9 lb

## 2020-12-04 DIAGNOSIS — L089 Local infection of the skin and subcutaneous tissue, unspecified: Secondary | ICD-10-CM | POA: Diagnosis not present

## 2020-12-04 MED ORDER — CEPHALEXIN 500 MG PO CAPS: 500.0000 mg | ORAL_CAPSULE | Freq: Two times a day (BID) | ORAL | 0 refills | Status: AC

## 2020-12-04 NOTE — Progress Notes (Signed)
Location: [facility] of Service:  Clinic (12)  Provider:   Code Status:  Goals of Care:  Advanced Directives 08/15/2020  Does Patient Have a Medical Advance Directive? Yes  Type of Paramedic of [place];Living will;Out of facility DNR (pink MOST or yellow form)  Does patient want to make changes to medical advance directive? No - Patient declined  Copy of [place] in Chart? Yes - validated most recent copy scanned in chart (See row information)  Would patient like information on creating a medical advance directive? -  Pre-existing out of facility DNR order (yellow form or pink MOST form) -     Chief Complaint  Patient presents with  . Acute Visit    Patient returns to the clinic to have some spots on his back looked at.     HPI: Patient is a 87 y.o. male seen today for an acute visit for a spot in his back which is tender. Patient has h/o Hypertension and Depression, TIA, Skin cancer Also h/o TIA/Migraine, Low back pain , Unstable Gait  Came to see me for a spot in his back which has started hurting a week ago He denies any discharge or fever.   Past Medical History:  Diagnosis Date  . Actinic keratosis 10/18/2012  . 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   . Atrophy, Fuchs' 12/10/2011  . 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 Cervicalgia 04/25/2013  . 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 Cyclitis, chronic 11/18/2012  . Deafness 10/18/2012  . Endothelial corneal dystrophy   . Essential hypertension 10/18/2012  . Hammer toe 04/24/2014  . Hyperlipidemia 10/18/2012  . Intermediate stage nonexudative age-related macular degeneration of both eyes 04/16/2016  . Nocturia 05/19/2016  . Occlusion and stenosis of carotid artery with cerebral infarction   . Other specified cardiac dysrhythmias(427.89)   . Pain in joint, pelvic region and thigh   .  Paresthesia 04/30/2015   Toes of both feet   . Peripheral vascular disease, unspecified ([place])   . Primary open angle glaucoma 09/25/2011  . 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  . Rosacea   . Seborrheic keratosis 04/24/2014  . Syncope 06/27/2013  . TIA (transient ischemic attack) 10/18/2012   States he has had about 5  times  . Urine frequency     Past Surgical History:  Procedure Laterality Date  . CATARACT EXTRACTION  2005   bilateral  . CORNEAL TRANSPLANT  2010 left; 2013 right eye  . HERNIA REPAIR  1985   Left  . HERNIA REPAIR  03/2004   Right  . SHOULDER DEBRIDEMENT Left 2005  . TONSILLECTOMY Bilateral childhood  . TRANSURETHRAL RESECTION OF PROSTATE N/A 10/2003  . VASECTOMY      No Known Allergies  Outpatient Encounter Medications as of 12/04/2020  Medication Sig  . amLODipine (NORVASC) 5 MG tablet Take 1 tablet (5 mg total) by mouth daily.  Marland Kitchen aspirin 325 MG tablet Take 325 mg by mouth every evening.  Marland Kitchen atorvastatin (LIPITOR) 40 MG tablet TAKE 1 TABLET DAILY  . cephALEXin (KEFLEX) 500 MG capsule Take 1 capsule (500 mg total) by mouth 2 (two) times daily for 7 days.  . DORZOLAMIDE HCL-TIMOLOL MAL OP Apply 1 drop to eye 2 (two) times daily.  . enalapril (VASOTEC) 20 MG tablet TAKE 1 TABLET TWICE A DAY TO CONTROL BLOOD PRESSURE  . loratadine (CLARITIN) 10 MG tablet Take 10 mg by  mouth daily.  . Multiple Vitamins-Minerals (PRESERVISION AREDS 2) CAPS Take 1 capsule by mouth 2 (two) times daily.  . Omega-3 Fatty Acids (FISH OIL) 1000 MG CAPS Take 1,000 mg by mouth 2 (two) times daily.    No facility-administered encounter medications on file as of 12/04/2020.    Review of Systems:  Review of Systems  Constitutional: Negative.   HENT: Negative.   Respiratory: Negative.   Cardiovascular: Negative.   Gastrointestinal: Negative.   Genitourinary: Negative.   Musculoskeletal: Positive for gait problem.  Neurological: Negative for dizziness.  Psychiatric/Behavioral: Positive for dysphoric  mood.    Health Maintenance  Topic Date Due  . COVID-19 Vaccine (4 - Booster for Moderna series) 09/03/2020  . INFLUENZA VACCINE  02/17/2021  . TETANUS/TDAP  02/25/2028  . PNA vac Low Risk Adult  Completed  . HPV VACCINES  Aged Out    Physical Exam: There were no vitals filed for this visit. There is no height or weight on file to calculate BMI. Physical Exam Vitals reviewed.  Constitutional:      Appearance: Normal appearance.  HENT:     Head: Normocephalic.     Nose: Nose normal.     Mouth/Throat:     Mouth: Mucous membranes are moist.     Pharynx: Oropharynx is clear.  Eyes:     Pupils: Pupils are equal, round, and reactive to light.  Cardiovascular:     Rate and Rhythm: Normal rate and regular rhythm.     Pulses: Normal pulses.  Pulmonary:     Effort: Pulmonary effort is normal.     Breath sounds: Normal breath sounds.  Abdominal:     General: Abdomen is flat. Bowel sounds are normal.     Palpations: Abdomen is soft.  Musculoskeletal:        General: No swelling.     Cervical back: Neck supple.  Skin:    General: Skin is warm.     Comments: There is Skin lesion in his Right Back on Scapula area. It has slight tenderness and redness. No Discharge noticed  Neurological:     General: No focal deficit present.     Mental Status: He is alert.  Psychiatric:        Mood and Affect: Mood normal.        Thought Content: Thought content normal.     Labs reviewed: Basic Metabolic Panel: Recent Labs    01/10/20 0821 05/09/20 0810  NA 140 140  K 4.1 4.2  CL 107 106  CO2 28 27  GLUCOSE 86 85  BUN 22 20  CREATININE 0.87 0.84  CALCIUM 8.5* 8.6  TSH  --  2.38   Liver Function Tests: Recent Labs    01/10/20 0821 05/09/20 0810  AST 19 19  ALT 15 18  BILITOT 0.9 0.9  PROT 5.5* 5.9*   No results for input(s): LIPASE, AMYLASE in the last 8760 hours. No results for input(s): AMMONIA in the last 8760 hours. CBC: Recent Labs    01/10/20 0821  05/09/20 0810  WBC 7.6 7.4  NEUTROABS 5,046  --   HGB 14.6 14.9  HCT 42.1 43.5  MCV 87.9 89.5  PLT 179 187   Lipid Panel: Recent Labs    05/09/20 0810  CHOL 123  HDL 43  LDLCALC 67  TRIG 54  CHOLHDL 2.9   No results found for: HGBA1C  Procedures since last visit: No results found.  Assessment/Plan 1. Infected skin lesion Most likely infection below Actinic  Keratosis Will start on Keflex 500 mg BID for 7 days Has appointment to see Dermatology in 10 days    Labs/tests ordered:  * No order type specified * Next appt:  04/24/2021

## 2020-12-06 ENCOUNTER — Telehealth: Payer: Self-pay | Admitting: Internal Medicine

## 2020-12-06 NOTE — Telephone Encounter (Signed)
Pt called inquiring about a picture that Dr Lyndel Safe took of an area on his shoulder & she was to send it to the pts email:  [email or web address]  For him to share with his dermatologist at his next appt. He is concerned bc he hasn't received the picture & Mr [facility] dermatology appt is next week.  Please advise  Vilinda Blanks

## 2020-12-09 NOTE — Telephone Encounter (Signed)
I have send it. Hopefully it will work this time

## 2020-12-11 DIAGNOSIS — L570 Actinic keratosis: Secondary | ICD-10-CM | POA: Diagnosis not present

## 2020-12-11 DIAGNOSIS — B078 Other viral warts: Secondary | ICD-10-CM | POA: Diagnosis not present

## 2020-12-11 DIAGNOSIS — Z85828 Personal history of other malignant neoplasm of skin: Secondary | ICD-10-CM | POA: Diagnosis not present

## 2020-12-11 DIAGNOSIS — L814 Other melanin hyperpigmentation: Secondary | ICD-10-CM | POA: Diagnosis not present

## 2020-12-11 DIAGNOSIS — C44622 Squamous cell carcinoma of skin of right upper limb, including shoulder: Secondary | ICD-10-CM | POA: Diagnosis not present

## 2020-12-11 DIAGNOSIS — L821 Other seborrheic keratosis: Secondary | ICD-10-CM | POA: Diagnosis not present

## 2020-12-25 DIAGNOSIS — Z23 Encounter for immunization: Secondary | ICD-10-CM | POA: Diagnosis not present

## 2020-12-27 DIAGNOSIS — M79672 Pain in left foot: Secondary | ICD-10-CM | POA: Diagnosis not present

## 2020-12-27 DIAGNOSIS — B351 Tinea unguium: Secondary | ICD-10-CM | POA: Diagnosis not present

## 2020-12-27 DIAGNOSIS — M79671 Pain in right foot: Secondary | ICD-10-CM | POA: Diagnosis not present

## 2021-02-12 ENCOUNTER — Other Ambulatory Visit: Payer: Self-pay | Admitting: Internal Medicine

## 2021-03-28 DIAGNOSIS — M79671 Pain in right foot: Secondary | ICD-10-CM | POA: Diagnosis not present

## 2021-03-28 DIAGNOSIS — M79672 Pain in left foot: Secondary | ICD-10-CM | POA: Diagnosis not present

## 2021-03-28 DIAGNOSIS — B351 Tinea unguium: Secondary | ICD-10-CM | POA: Diagnosis not present

## 2021-04-03 ENCOUNTER — Encounter: Payer: Self-pay | Admitting: Internal Medicine

## 2021-04-03 ENCOUNTER — Non-Acute Institutional Stay: Payer: Medicare Other | Admitting: Internal Medicine

## 2021-04-03 DIAGNOSIS — G459 Transient cerebral ischemic attack, unspecified: Secondary | ICD-10-CM

## 2021-04-03 DIAGNOSIS — G8929 Other chronic pain: Secondary | ICD-10-CM

## 2021-04-03 DIAGNOSIS — L719 Rosacea, unspecified: Secondary | ICD-10-CM | POA: Diagnosis not present

## 2021-04-03 DIAGNOSIS — M545 Low back pain, unspecified: Secondary | ICD-10-CM | POA: Diagnosis not present

## 2021-04-03 DIAGNOSIS — F320 Major depressive disorder, single episode, mild: Secondary | ICD-10-CM | POA: Diagnosis not present

## 2021-04-03 DIAGNOSIS — I10 Essential (primary) hypertension: Secondary | ICD-10-CM

## 2021-04-03 DIAGNOSIS — E785 Hyperlipidemia, unspecified: Secondary | ICD-10-CM

## 2021-04-03 NOTE — Progress Notes (Signed)
Provider:  Veleta Miners MD Location:   [place] Room Number: [id number] Place of Service:  ALF ([phone])  PCP: Virgie Dad, MD Patient Care Team: Virgie Dad, MD as PCP - General (Internal Medicine) Melina Modena, [facility] Crista Luria, MD as Consulting Physician (Dermatology) Marilynne Halsted, MD as Referring Physician (Ophthalmology) Bond, Tracie Harrier, MD as Referring Physician (Ophthalmology) Virgie Dad, MD as Referring Physician (Internal Medicine)  Extended Emergency Contact Information Primary Emergency Contact: Osvaldo Human of [place] Phone: [phone] Relation: Son  Code Status: DNR Goals of Care: Advanced Directive information Advanced Directives 04/03/2021  Does Patient Have a Medical Advance Directive? Yes  Type of Advance Directive Out of facility DNR (pink MOST or yellow form);Living will;Healthcare Power of Attorney  Does patient want to make changes to medical advance directive? No - Patient declined  Copy of [place] in Chart? Yes - validated most recent copy scanned in chart (See row information)  Would patient like information on creating a medical advance directive? -  Pre-existing out of facility DNR order (yellow form or pink MOST form) Yellow form placed in chart (order not valid for inpatient use)      Chief Complaint  Patient presents with   New Admit To SNF    Admission to AL    HPI: Patient is a 87 y.o. male seen today for admission to  AL  Patient has h/o Hypertension and Depression, TIA, Skin cancer   Had Mechanical  Fall in Jan . Followed by Low back pain Treated with Meloxicam and tylenol  Also got therapy No walks with [place] Rile Gave up Driving  History of TIA versus migraine Had detailed work-up by neurology for his atypical symptoms of visual impairment. With Aphasia and Word finding.He has had detail work ups including EEG, Echo and MRI .which have been Negative. They  have recommended Asprin, BP control and Statin.  Rcently not able to take care of his Apartment. Says it was time to move to AL Does look more confused and repeats himself Mood is good. 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/18/2012   Deafness 10/18/2012   Endothelial corneal dystrophy    Essential hypertension 10/18/2012   Hammer toe 04/24/2014   Hyperlipidemia 10/18/2012   Intermediate stage nonexudative age-related macular degeneration of both eyes 04/16/2016   Nocturia 05/19/2016   Occlusion and stenosis of carotid artery with cerebral infarction    Other specified cardiac dysrhythmias(427.89)    Pain in joint, pelvic region and thigh    Paresthesia 04/30/2015   Toes of both feet    Peripheral vascular disease, unspecified ([place])    Primary open angle glaucoma 09/25/2011   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   Rosacea    Seborrheic keratosis 04/24/2014   Syncope 06/27/2013   TIA (transient ischemic attack) 10/18/2012   States he has had about 5  times   Urine frequency    Past Surgical History:  Procedure Laterality Date   CATARACT EXTRACTION  2005   bilateral   CORNEAL TRANSPLANT  2010 left; 2013 right eye   [place]   Left  HERNIA REPAIR  03/2004   Right   SHOULDER DEBRIDEMENT Left 2005   TONSILLECTOMY Bilateral childhood   TRANSURETHRAL RESECTION OF PROSTATE N/A 10/2003   VASECTOMY      reports that he quit smoking about 56 years ago. He has a 25.00 pack-year smoking history. He has never used smokeless tobacco. He reports that he does not drink alcohol and does not use drugs. Social History   Socioeconomic History   Marital status:  Widowed    Spouse name: Not on file   Number of children: Not on file   Years of education: Not on file   Highest education level: Not on file  Occupational History   Occupation: retired Insurance claims handler  Tobacco Use   Smoking status: Former    Packs/day: 1.00    Years: 25.00    Pack years: 25.00    Types: Cigarettes    Quit date: 10/18/1964    Years since quitting: 56.4   Smokeless tobacco: Never   Tobacco comments:    quit 1966  Vaping Use   Vaping Use: Never used  Substance and Sexual Activity   Alcohol use: No    Alcohol/week: 1.0 standard drink    Types: 1 Glasses of wine per week    Comment: very seldom   Drug use: No   Sexual activity: Not on file  Other Topics Concern   Not on file  Social History Narrative   [facility] since 08/21/2011   Married to Dahlgren Will   Exercise: 3 x week bicycle   Previously smoked stopped 1966   Does not drink cafferinated beverage   Drinks one small glass of wine few nights a week    Social Determinants of Health   Financial Resource Strain: Not on file  Food Insecurity: Not on file  Transportation Needs: Not on file  Physical Activity: Not on file  Stress: Not on file  Social Connections: Not on file  Intimate Partner Violence: Not on file    Functional Status Survey:    Family History  Problem Relation Age of Onset   Cancer Mother    Heart disease Father     Health Maintenance  Topic Date Due   COVID-19 Vaccine (4 - Booster for Moderna series) 08/26/2020   INFLUENZA VACCINE  02/17/2021   TETANUS/TDAP  02/25/2028   PNA vac Low Risk Adult  Completed   Zoster Vaccines- Shingrix  Completed   HPV VACCINES  Aged Out    No Known Allergies  Allergies as of 04/03/2021   No Known Allergies      Medication List        Accurate as of April 03, 2021 11:44 AM. If you have any questions, ask your nurse or doctor.          amLODipine 5 MG tablet Commonly known as: NORVASC TAKE 1  TABLET DAILY   aspirin 325 MG tablet Take 325 mg by mouth every evening.   atorvastatin 40 MG tablet Commonly known as: LIPITOR TAKE 1 TABLET DAILY   DORZOLAMIDE HCL-TIMOLOL MAL OP Apply 1 drop to eye 2 (two) times daily.   enalapril 20 MG tablet Commonly known as: VASOTEC TAKE 1 TABLET TWICE A DAY TO CONTROL BLOOD PRESSURE   Fish Oil 1000 MG Caps Take 1,000 mg by mouth 2 (two) times daily.   loratadine 10 MG tablet Commonly known as: CLARITIN Take 10 mg by mouth daily.   PreserVision AREDS 2 Caps  Take 1 capsule by mouth 2 (two) times daily.        Review of Systems Review of Systems  Constitutional: Negative for activity change, appetite change, chills, diaphoresis, fatigue and fever.  HENT: Negative for mouth sores, postnasal drip, rhinorrhea, sinus pain and sore throat.   Respiratory: Negative for apnea, cough, chest tightness, shortness of breath and wheezing.   Cardiovascular: Negative for chest pain, palpitations and leg swelling.  Gastrointestinal: Negative for abdominal distention, abdominal pain, constipation, diarrhea, nausea and vomiting.  Genitourinary: Negative for dysuria and frequency.  Musculoskeletal: Negative for arthralgias, joint swelling and myalgias.  Skin: Negative for rash.  Neurological: Negative for dizziness, syncope, weakness, light-headedness and numbness.  Psychiatric/Behavioral: Negative for behavioral problems, confusion and sleep disturbance.    Vitals:   04/03/21 1140  BP: (!) 157/84  Pulse: 69  Resp: 18  Temp: (!) 96.9 F (36.1 C)  SpO2: 96%  Weight: 164 lb 3.2 oz (74.5 kg)  Height: '5\' 7"'$  (1.702 m)   Body mass index is 25.72 kg/m. Physical Exam Constitutional: Oriented to person, place, and time. Well-developed and well-nourished.  HENT:  Head: Normocephalic.  Mouth/Throat: Oropharynx is clear and moist.  Eyes: Pupils are equal, round, and reactive to light.  Neck: Neck supple.  Cardiovascular: Normal rate and normal  heart sounds.  No murmur heard. Pulmonary/Chest: Effort normal and breath sounds normal. No respiratory distress. No wheezes. He has no rales.  Abdominal: Soft. Bowel sounds are normal. No distension. There is no tenderness. There is no rebound.  Musculoskeletal: No edema.  Lymphadenopathy: none Neurological: Alert and oriented to person, place, and time.  Skin: Skin is warm and dry.  Psychiatric: Normal mood and affect. Behavior is normal. Thought content normal.   Labs reviewed: Basic Metabolic Panel: Recent Labs    05/09/20 0810  NA 140  K 4.2  CL 106  CO2 27  GLUCOSE 85  BUN 20  CREATININE 0.84  CALCIUM 8.6   Liver Function Tests: Recent Labs    05/09/20 0810  AST 19  ALT 18  BILITOT 0.9  PROT 5.9*   No results for input(s): LIPASE, AMYLASE in the last 8760 hours. No results for input(s): AMMONIA in the last 8760 hours. CBC: Recent Labs    05/09/20 0810  WBC 7.4  HGB 14.9  HCT 43.5  MCV 89.5  PLT 187   Cardiac Enzymes: No results for input(s): CKTOTAL, CKMB, CKMBINDEX, TROPONINI in the last 8760 hours. BNP: Invalid input(s): POCBNP No results found for: HGBA1C Lab Results  Component Value Date   TSH 2.38 05/09/2020   No results found for: VITAMINB12 No results found for: FOLATE No results found for: IRON, TIBC, FERRITIN  Imaging and Procedures obtained prior to SNF admission: EEG  Result Date: 01/06/2018 Cameron Sprang, MD     01/06/2018 11:28 AM ELECTROENCEPHALOGRAM REPORT Date of Study: 01/06/2018 Patient's Name: Regis Mansouri MRN: [id number] Date of Birth: 03/18/1927 Referring Provider: Dr. Myrene Buddy Clinical History: This is an 87 year old man with transient vision changes and word-finding difficulties. Medications: No AEDs or sedating medications listed Technical Summary: A multichannel digital EEG recording measured by the international 10-20 system with electrodes applied with paste and impedances below 5000 ohms performed in our  laboratory with EKG monitoring in an awake and asleep patient.  Hyperventilation and photic stimulation were not performed.  The digital EEG was referentially recorded, reformatted, and digitally filtered in a variety of bipolar and referential montages for optimal display.  Description: The  patient is awake and asleep during the recording.  During maximal wakefulness, there is a symmetric, medium voltage 8 Hz posterior dominant rhythm that attenuates with eye opening.  The record is symmetric.  During drowsiness and sleep, there is an increase in theta slowing of the background.  Vertex waves and symmetric sleep spindles were seen.  Hyperventilation and photic stimulation did not elicit any abnormalities.  There were no epileptiform discharges or electrographic seizures seen.  EKG lead was unremarkable. Impression: This awake and asleep EEG is normal.  Clinical Correlation: A normal EEG does not exclude a clinical diagnosis of epilepsy. Clinical correlation is advised. Ellouise Newer, M.D.   CT ANGIO HEAD W OR WO CONTRAST  Result Date: 01/05/2018 CLINICAL DATA:  Abnormal vision and word-finding difficulty.  TIA. EXAM: CT ANGIOGRAPHY HEAD AND NECK TECHNIQUE: Multidetector CT imaging of the head and neck was performed using the standard protocol during bolus administration of intravenous contrast. Multiplanar CT image reconstructions and MIPs were obtained to evaluate the vascular anatomy. Carotid stenosis measurements (when applicable) are obtained utilizing NASCET criteria, using the distal internal carotid diameter as the denominator. CONTRAST:  64m ISOVUE-370 IOPAMIDOL (ISOVUE-370) INJECTION 76% COMPARISON:  Head CT from earlier today FINDINGS: CTA NECK FINDINGS Aortic arch: Atherosclerotic plaque. Brachiocephalic and right common carotid origins are incompletely covered. Right carotid system: Calcified plaque at the common carotid bifurcation primarily narrowing the ECA origin. No ICA flow limiting stenosis  or ulceration. The right ICA is small compared to the left due to variant circle-of-Willis. Left carotid system: Mild calcified plaque at the common carotid bifurcation and proximal ICA. No flow limiting stenosis or ulceration. Vertebral arteries: No proximal subclavian flow limiting stenosis. Strong left vertebral artery dominance. Both vertebral arteries are widely patent to the dura. Skeleton: Diffuse degenerative change without acute finding. Other neck: No acute finding Upper chest: Mild centrilobular emphysema. Review of the MIP images confirms the above findings CTA HEAD FINDINGS Anterior circulation: Azygos A2 with aplastic right A1 segment. The left ICA is larger than the right. Calcified plaque on the carotid siphons with mild to moderate narrowing at the right cavernous anterior genu. Bilateral M2 segment atheromatous narrowings that are moderate to advanced. Multifocal medium-sized ACA branch high-grade narrowings best seen on sagittal thick MIPS. Posterior circulation: The right vertebral artery ends in PICA. A left PICA is also patent. Vertebral and basilar tortuosity. Atheromatous irregularity of bilateral posterior cerebral arteries with moderate to advanced bilateral P2 segment narrowings. Negative for aneurysm. Venous sinuses: Patent on the delayed phase Anatomic variants: As above Delayed phase: No abnormal parenchymal enhancement. Chronic small vessel ischemia in the cerebral white matter and deep gray nuclei. Review of the MIP images confirms the above findings IMPRESSION: 1. No emergent large vessel occlusion. 2. Extensive intracranial atherosclerosis with moderate to advanced narrowing of bilateral M2 and P2 segments. Multifocal advanced narrowing of medium size vessels in the ACA distribution. 3. Atherosclerosis in the neck without flow limiting stenosis. 4.  Aortic Atherosclerosis (ICD10-I70.0). Electronically Signed   By: JMonte FantasiaM.D.   On: 01/05/2018 19:33   DG Chest 2  View  Result Date: 01/05/2018 CLINICAL DATA:  Visual alterations.  Hypertension. EXAM: CHEST - 2 VIEW COMPARISON:  Chest CT December 30, 2016 FINDINGS: No edema or consolidation. Heart is upper normal in size with pulmonary vascularity normal. No adenopathy. No bone lesions. IMPRESSION: No edema or consolidation. Electronically Signed   By: WLowella GripIII M.D.   On: 01/05/2018 13:23   CT Head Wo  Contrast  Result Date: 01/05/2018 CLINICAL DATA:  Aphasia and altered vision EXAM: CT HEAD WITHOUT CONTRAST TECHNIQUE: Contiguous axial images were obtained from the base of the skull through the vertex without intravenous contrast. COMPARISON:  Dec 17, 2017 FINDINGS: Brain: Moderate diffuse atrophy is stable. Prominence of the cisterna magna is an anatomic variant. There is no intracranial mass, hemorrhage, extra-axial fluid collection, or midline shift. There is extensive small vessel disease throughout the centra semiovale bilaterally. Small vessel disease is noted in the internal and external capsule regions bilaterally. There is evidence of a prior small infarct in the left thalamus. There is a small lacunar infarct in the inferior right centrum semiovale. There is small vessel disease in the basilar perforator distribution of the mid pons. No acute infarct is demonstrated on this study. Vascular: There is no appreciable hyperdense vessel. There is calcification in each carotid siphon region as well as in the left distal vertebral artery. Skull: Bony calvarium appears intact. There is a left frontal scalp lipoma measuring 3.1 x 0.8 cm. Sinuses/Orbits: There is mucosal thickening in several ethmoid air cells. There are retention cysts in each maxillary antrum. Other paranasal sinuses are clear. Orbits appear symmetric bilaterally. Patient has had cataract extractions bilaterally. Other: Mastoid air cells are clear. IMPRESSION: 1. Atrophy with extensive supratentorial small vessel disease. Occasional prior  lacunar infarcts. There is also basilar pontine distribution small vessel disease. No acute infarct evident. No mass or hemorrhage. 2.  Foci of arterial vascular calcification noted. 3.  Areas of paranasal sinus disease. 4.  Left frontal scalp lipoma. Electronically Signed   By: Lowella Grip III M.D.   On: 01/05/2018 13:19   CT ANGIO NECK W OR WO CONTRAST  Result Date: 01/05/2018 CLINICAL DATA:  Abnormal vision and word-finding difficulty.  TIA. EXAM: CT ANGIOGRAPHY HEAD AND NECK TECHNIQUE: Multidetector CT imaging of the head and neck was performed using the standard protocol during bolus administration of intravenous contrast. Multiplanar CT image reconstructions and MIPs were obtained to evaluate the vascular anatomy. Carotid stenosis measurements (when applicable) are obtained utilizing NASCET criteria, using the distal internal carotid diameter as the denominator. CONTRAST:  29m ISOVUE-370 IOPAMIDOL (ISOVUE-370) INJECTION 76% COMPARISON:  Head CT from earlier today FINDINGS: CTA NECK FINDINGS Aortic arch: Atherosclerotic plaque. Brachiocephalic and right common carotid origins are incompletely covered. Right carotid system: Calcified plaque at the common carotid bifurcation primarily narrowing the ECA origin. No ICA flow limiting stenosis or ulceration. The right ICA is small compared to the left due to variant circle-of-Willis. Left carotid system: Mild calcified plaque at the common carotid bifurcation and proximal ICA. No flow limiting stenosis or ulceration. Vertebral arteries: No proximal subclavian flow limiting stenosis. Strong left vertebral artery dominance. Both vertebral arteries are widely patent to the dura. Skeleton: Diffuse degenerative change without acute finding. Other neck: No acute finding Upper chest: Mild centrilobular emphysema. Review of the MIP images confirms the above findings CTA HEAD FINDINGS Anterior circulation: Azygos A2 with aplastic right A1 segment. The left ICA is  larger than the right. Calcified plaque on the carotid siphons with mild to moderate narrowing at the right cavernous anterior genu. Bilateral M2 segment atheromatous narrowings that are moderate to advanced. Multifocal medium-sized ACA branch high-grade narrowings best seen on sagittal thick MIPS. Posterior circulation: The right vertebral artery ends in PICA. A left PICA is also patent. Vertebral and basilar tortuosity. Atheromatous irregularity of bilateral posterior cerebral arteries with moderate to advanced bilateral P2 segment narrowings. Negative for aneurysm. Venous  sinuses: Patent on the delayed phase Anatomic variants: As above Delayed phase: No abnormal parenchymal enhancement. Chronic small vessel ischemia in the cerebral white matter and deep gray nuclei. Review of the MIP images confirms the above findings IMPRESSION: 1. No emergent large vessel occlusion. 2. Extensive intracranial atherosclerosis with moderate to advanced narrowing of bilateral M2 and P2 segments. Multifocal advanced narrowing of medium size vessels in the ACA distribution. 3. Atherosclerosis in the neck without flow limiting stenosis. 4.  Aortic Atherosclerosis (ICD10-I70.0). Electronically Signed   By: Monte Fantasia M.D.   On: 01/05/2018 19:33   MR BRAIN WO CONTRAST  Result Date: 01/05/2018 CLINICAL DATA:  Initial evaluation for acute visual changes, word-finding difficulty. EXAM: MRI HEAD WITHOUT CONTRAST MRA HEAD WITHOUT CONTRAST TECHNIQUE: Multiplanar, multiecho pulse sequences of the brain and surrounding structures were obtained without intravenous contrast. Angiographic images of the head were obtained using MRA technique without contrast. COMPARISON:  Prior CT and CTA from earlier the same day. FINDINGS: MRI HEAD FINDINGS Brain: Generalized age-related cerebral atrophy. Patchy and confluent T2/FLAIR hyperintensity within the periventricular and deep white matter both cerebral hemispheres, most consistent with chronic  small vessel ischemic change, moderate to advanced in nature. He scattered superimposed remote lacunar infarcts present within the hemispheric cerebral white matter bilaterally. No abnormal foci of restricted diffusion suggest acute or subacute. Gray-white matter differentiation. No evidence for acute intracranial hemorrhage. Few scattered chronic micro hemorrhages noted within the cerebellum and deep gray nuclei, like related to chronic microvascular ischemic disease and/or hypertension. No mass lesion, midline shift or mass effect. Ventricular prominence related to global parenchymal volume loss without hydrocephalus. Extra-axial collection. Pituitary gland within normal limits. Vascular: Major intracranial vascular flow voids maintained. Skull and upper cervical spine: Craniocervical junction normal. Upper cervical spine within normal limits. Bone marrow signal intensity normal. Small lipoma noted at the left frontal scalp. Sinuses/Orbits: Globes and orbital soft tissues demonstrate no acute finding. Patient status post lens extraction bilaterally. Small right maxillary sinus retention cyst. No mastoid effusion. Other: None. MRA HEAD FINDINGS ANTERIOR CIRCULATION: Internal carotid arteries patent to the termini without hemodynamically significant stenosis. Aplastic right A1 with widely patent left A1. Azygos A2 without stenosis. Moderate atheromatous irregularity within visualized distal ACA branches. No M1 stenosis or occlusion. Moderate bilateral M2 stenoses with prominent distal atheromatous irregularity throughout the MCA branches. POSTERIOR CIRCULATION: Dominant left vertebral artery widely patent. Hypoplastic right vertebral artery terminates in PICA. Posterior inferior cerebral arteries patent bilaterally. Vertebrobasilar tortuosity. Superior cerebral arteries patent proximally. PCAs supplied via the basilar. Moderate bilateral P2 stenoses, tandem on the left. Distal PCA small vessel atheromatous  irregularity. No aneurysm. IMPRESSION: MRI HEAD IMPRESSION: 1. No acute intracranial infarct or other abnormality. 2. Age-related cerebral atrophy with moderate to advanced chronic small vessel ischemic disease. MRA HEAD IMPRESSION: 1. Negative MRA for large vessel occlusion. 2. Extensive intracranial atherosclerosis with moderate bilateral M2 and P2 stenoses. 3. Aplastic right A1. Azygos A2 supplied via the left carotid artery system. Electronically Signed   By: Jeannine Boga M.D.   On: 01/05/2018 20:06   MR MRA HEAD WO CONTRAST  Result Date: 01/05/2018 CLINICAL DATA:  Initial evaluation for acute visual changes, word-finding difficulty. EXAM: MRI HEAD WITHOUT CONTRAST MRA HEAD WITHOUT CONTRAST TECHNIQUE: Multiplanar, multiecho pulse sequences of the brain and surrounding structures were obtained without intravenous contrast. Angiographic images of the head were obtained using MRA technique without contrast. COMPARISON:  Prior CT and CTA from earlier the same day. FINDINGS: MRI HEAD FINDINGS Brain: Generalized  age-related cerebral atrophy. Patchy and confluent T2/FLAIR hyperintensity within the periventricular and deep white matter both cerebral hemispheres, most consistent with chronic small vessel ischemic change, moderate to advanced in nature. He scattered superimposed remote lacunar infarcts present within the hemispheric cerebral white matter bilaterally. No abnormal foci of restricted diffusion suggest acute or subacute. Gray-white matter differentiation. No evidence for acute intracranial hemorrhage. Few scattered chronic micro hemorrhages noted within the cerebellum and deep gray nuclei, like related to chronic microvascular ischemic disease and/or hypertension. No mass lesion, midline shift or mass effect. Ventricular prominence related to global parenchymal volume loss without hydrocephalus. Extra-axial collection. Pituitary gland within normal limits. Vascular: Major intracranial vascular  flow voids maintained. Skull and upper cervical spine: Craniocervical junction normal. Upper cervical spine within normal limits. Bone marrow signal intensity normal. Small lipoma noted at the left frontal scalp. Sinuses/Orbits: Globes and orbital soft tissues demonstrate no acute finding. Patient status post lens extraction bilaterally. Small right maxillary sinus retention cyst. No mastoid effusion. Other: None. MRA HEAD FINDINGS ANTERIOR CIRCULATION: Internal carotid arteries patent to the termini without hemodynamically significant stenosis. Aplastic right A1 with widely patent left A1. Azygos A2 without stenosis. Moderate atheromatous irregularity within visualized distal ACA branches. No M1 stenosis or occlusion. Moderate bilateral M2 stenoses with prominent distal atheromatous irregularity throughout the MCA branches. POSTERIOR CIRCULATION: Dominant left vertebral artery widely patent. Hypoplastic right vertebral artery terminates in PICA. Posterior inferior cerebral arteries patent bilaterally. Vertebrobasilar tortuosity. Superior cerebral arteries patent proximally. PCAs supplied via the basilar. Moderate bilateral P2 stenoses, tandem on the left. Distal PCA small vessel atheromatous irregularity. No aneurysm. IMPRESSION: MRI HEAD IMPRESSION: 1. No acute intracranial infarct or other abnormality. 2. Age-related cerebral atrophy with moderate to advanced chronic small vessel ischemic disease. MRA HEAD IMPRESSION: 1. Negative MRA for large vessel occlusion. 2. Extensive intracranial atherosclerosis with moderate bilateral M2 and P2 stenoses. 3. Aplastic right A1. Azygos A2 supplied via the left carotid artery system. Electronically Signed   By: Jeannine Boga M.D.   On: 01/05/2018 20:06   ECHOCARDIOGRAM COMPLETE  Result Date: 01/06/2018                            *[facility]*                         [employer] [place], [place] [address]                            [phone] ------------------------------------------------------------------- Transthoracic Echocardiography Patient:    Leelyn, Newmeyer MR #:       [id number] Study Date: 01/06/2018 Gender:     M Age:        40 Height:     172.7 cm Weight:     70.3 kg BSA:        1.84 m^2 Pt. Status: Room:       3W28C  ADMITTING    Zachery Dauer  PERFORMING   [facility], Inpatient  ATTENDING    Edwin Dada  SONOGRAPHER  Cardell Peach, RDCS cc: ------------------------------------------------------------------- LV EF: 60% -   65% ------------------------------------------------------------------- Indications:      TIA 435.9. ------------------------------------------------------------------- History:   PMH:   Syncope.  Transient ischemic attack.  Risk factors:  Hypertension. Dyslipidemia. ------------------------------------------------------------------- Study Conclusions - Left ventricle: The cavity size was normal. Wall thickness was   increased in a pattern of mild LVH. Systolic function was normal.   The estimated ejection fraction was in the range of 60% to 65%.   Wall motion was normal; there were no regional wall motion   abnormalities. Doppler parameters are consistent with abnormal   left ventricular relaxation (grade 1 diastolic dysfunction). - Aortic valve: There was no stenosis. There was trivial   regurgitation. - Aorta: Aortic root dimension: 43 mm (ED). - Aortic root: The aortic root was mildly dilated. - Mitral valve: Mildly to moderately calcified annulus. There was   trivial regurgitation. - Right ventricle: The cavity size was normal. Systolic function   was normal. - Tricuspid valve: Peak RV-RA gradient (S): 36 mm Hg. - Pulmonary arteries: PA peak pressure: 39 mm Hg (S). - Inferior vena cava: The vessel was normal in size. The   respirophasic diameter changes were in the normal range (>= 50%),    consistent with normal central venous pressure. Impressions: - Normal LV size with mild LV hypertrophy. EF 60-65%. Normal RV   size and systolic function. No significant valvular   abnormalities. ------------------------------------------------------------------- Study data:  No prior study was available for comparison.  Study status:  Routine.  Procedure:  Transthoracic echocardiography. Image quality was adequate.  Study completion:  There were no complications.          Transthoracic echocardiography.  M-mode, complete 2D, spectral Doppler, and color Doppler.  Birthdate: Patient birthdate: Dec 19, 1926.  Age:  Patient is 87 yr old.  Sex: Gender: male.    BMI: 23.6 kg/m^2.  Blood pressure:     148/84 Patient status:  Inpatient.  Study date:  Study date: 01/06/2018. Study time: 01:21 PM.  Location:  Bedside. ------------------------------------------------------------------- ------------------------------------------------------------------- Left ventricle:  The cavity size was normal. Wall thickness was increased in a pattern of mild LVH. Systolic function was normal. The estimated ejection fraction was in the range of 60% to 65%. Wall motion was normal; there were no regional wall motion abnormalities. Doppler parameters are consistent with abnormal left ventricular relaxation (grade 1 diastolic dysfunction). ------------------------------------------------------------------- Aortic valve:   Trileaflet; mildly calcified leaflets.  Doppler: There was no stenosis.   There was trivial regurgitation. ------------------------------------------------------------------- Aorta:  Aortic root: The aortic root was mildly dilated. Ascending aorta: The ascending aorta was normal in size. ------------------------------------------------------------------- Mitral valve:   Mildly to moderately calcified annulus.  Doppler: There was no evidence for stenosis.   There was trivial regurgitation.    Valve area by pressure half-time:  2.78 cm^2. Indexed valve area by pressure half-time: 1.51 cm^2/m^2.    Peak gradient (D): 2 mm Hg. ------------------------------------------------------------------- Left atrium:  The atrium was normal in size. ------------------------------------------------------------------- Right ventricle:  The cavity size was normal. Systolic function was normal. ------------------------------------------------------------------- Pulmonic valve:    Structurally normal valve.   Cusp separation was normal.  Doppler:  Transvalvular velocity was within the normal range. There was trivial regurgitation. ------------------------------------------------------------------- Tricuspid valve:   Doppler:  There was trivial regurgitation.  ------------------------------------------------------------------- Right atrium:  The atrium was normal in size. ------------------------------------------------------------------- Pericardium:  There was no pericardial effusion. ------------------------------------------------------------------- Systemic veins: Inferior vena cava: The vessel was normal in size. The respirophasic diameter changes were  in the normal range (>= 50%), consistent with normal central venous pressure. ------------------------------------------------------------------- Measurements  Left ventricle                           Value          Reference  LV ID, ED, PLAX chordal                  47    mm       43 - 52  LV ID, ES, PLAX chordal                  24    mm       23 - 38  LV fx shortening, PLAX chordal           49    %        >=29  LV PW thickness, ED                      10    mm       ----------  Stroke volume, 2D                        106   ml       ----------  Stroke volume/bsa, 2D                    58    ml/m^2   ----------  LV e&', lateral                           7.94  cm/s     ----------  LV E/e&', lateral                         9.28           ----------  LV e&', medial                            6.85  cm/s      ----------  LV E/e&', medial                          10.76          ----------  LV e&', average                           7.4   cm/s     ----------  LV E/e&', average                         9.97           ----------   Ventricular septum                       Value          Reference  IVS thickness, ED                        16    mm       ----------   LVOT  Value          Reference  LVOT ID, S                               22    mm       ----------  LVOT area                                3.8   cm^2     ----------  LVOT peak velocity, S                    129   cm/s     ----------  LVOT mean velocity, S                    80.6  cm/s     ----------  LVOT VTI, S                              28    cm       ----------  LVOT peak gradient, S                    7     mm Hg    ----------   Aortic valve                             Value          Reference  Aortic regurg pressure half-time         589   ms       ----------   Aorta                                    Value          Reference  Aortic root ID, ED                       34    mm       ----------   Left atrium                              Value          Reference  LA ID, A-P, ES                           27    mm       ----------  LA ID/bsa, A-P                           1.47  cm/m^2   <=2.2  LA volume, S                             55.1  ml       ----------  LA volume/bsa, S                         29.9  ml/m^2   ----------  LA volume, ES, 1-p A4C                   45    ml       ----------  LA volume/bsa, ES, 1-p A4C               24.4  ml/m^2   ----------  LA volume, ES, 1-p A2C                   60.7  ml       ----------  LA volume/bsa, ES, 1-p A2C               33    ml/m^2   ----------   Mitral valve                             Value          Reference  Mitral E-wave peak velocity              73.7  cm/s     ----------  Mitral deceleration time         (H)     271   ms       150 - 230  Mitral pressure half-time                 79    ms       ----------  Mitral peak gradient, D                  2     mm Hg    ----------  Mitral valve area, PHT, DP               2.78  cm^2     ----------  Mitral valve area/bsa, PHT, DP           1.51  cm^2/m^2 ----------   Pulmonary arteries                       Value          Reference  PA pressure, S, DP               (H)     39    mm Hg    <=30   Tricuspid valve                          Value          Reference  Tricuspid regurg peak velocity           299   cm/s     ----------  Tricuspid peak RV-RA gradient            36    mm Hg    ----------   Right atrium                             Value          Reference  RA ID, S-I, ES, A4C              (H)     52.5  mm       34 - 49  RA area, ES, A4C  16.1  cm^2     8.3 - 19.5  RA volume, ES, A/L                       40.2  ml       ----------  RA volume/bsa, ES, A/L                   21.8  ml/m^2   ----------   Systemic veins                           Value          Reference  Estimated CVP                            3     mm Hg    ----------   Right ventricle                          Value          Reference  RV ID, minor axis, ED, A4C base          34    mm       ----------  TAPSE                                    26.7  mm       ----------  RV s&', lateral, S                        11.4  cm/s     ---------- Legend: (L)  and  (H)  mark values outside specified reference range. ------------------------------------------------------------------- Prepared and Electronically Authenticated by Loralie Champagne, M.D. 2019-06-20T17:59:55   Assessment/Plan Essential hypertension BP Mildily Elevated No Change today Continue Norvasc and Vasotec TIA (transient ischemic attack) On High Dose of Aspirin and Statin Depression, major, single episode, mild ([place]) Has refused Antidepressants Hyperlipidemia, unspecified hyperlipidemia type On Statin Repeat Lipid Rosacea Uses Only Sunscreen Chronic midline low back pain without  sciatica On Tylenol Mild Cognitive Impairment Repeat MMSE   Family/ staff Communication:   Labs/tests ordered: CBC,CMP,Lipid TSH

## 2021-04-07 DIAGNOSIS — E039 Hypothyroidism, unspecified: Secondary | ICD-10-CM | POA: Diagnosis not present

## 2021-04-07 DIAGNOSIS — E785 Hyperlipidemia, unspecified: Secondary | ICD-10-CM | POA: Diagnosis not present

## 2021-04-07 DIAGNOSIS — D649 Anemia, unspecified: Secondary | ICD-10-CM | POA: Diagnosis not present

## 2021-04-07 LAB — COMPREHENSIVE METABOLIC PANEL
Albumin: 4 (ref 3.5–5.0)
Calcium: 8.7 (ref 8.7–10.7)
Globulin: 1.9

## 2021-04-07 LAB — HEPATIC FUNCTION PANEL
ALT: 30 (ref 10–40)
AST: 26 (ref 14–40)
Alkaline Phosphatase: 59 (ref 25–125)
Bilirubin, Total: 0.6

## 2021-04-07 LAB — LIPID PANEL
Cholesterol: 135 (ref 0–200)
HDL: 48 (ref 35–70)
LDL Cholesterol: 73
LDl/HDL Ratio: 2.8
Triglycerides: 60 (ref 40–160)

## 2021-04-07 LAB — CBC AND DIFFERENTIAL
HCT: 44 (ref 41–53)
Hemoglobin: 14.8 (ref 13.5–17.5)
Neutrophils Absolute: 4701
Platelets: 197 (ref 150–399)
WBC: 7.9

## 2021-04-07 LAB — TSH: TSH: 2.73 (ref 0.41–5.90)

## 2021-04-07 LAB — BASIC METABOLIC PANEL
BUN: 29 — AB (ref 4–21)
CO2: 21 (ref 13–22)
Chloride: 107 (ref 99–108)
Creatinine: 0.9 (ref 0.6–1.3)
Glucose: 88
Potassium: 4.1 (ref 3.4–5.3)
Sodium: 142 (ref 137–147)

## 2021-04-07 LAB — CBC: RBC: 4.86 (ref 3.87–5.11)

## 2021-04-09 ENCOUNTER — Non-Acute Institutional Stay: Payer: Medicare Other | Admitting: Orthopedic Surgery

## 2021-04-09 DIAGNOSIS — M545 Low back pain, unspecified: Secondary | ICD-10-CM

## 2021-04-09 DIAGNOSIS — I10 Essential (primary) hypertension: Secondary | ICD-10-CM

## 2021-04-09 DIAGNOSIS — E785 Hyperlipidemia, unspecified: Secondary | ICD-10-CM

## 2021-04-09 DIAGNOSIS — R4701 Aphasia: Secondary | ICD-10-CM | POA: Diagnosis not present

## 2021-04-09 DIAGNOSIS — R4189 Other symptoms and signs involving cognitive functions and awareness: Secondary | ICD-10-CM | POA: Diagnosis not present

## 2021-04-09 DIAGNOSIS — G8929 Other chronic pain: Secondary | ICD-10-CM | POA: Diagnosis not present

## 2021-04-09 DIAGNOSIS — G459 Transient cerebral ischemic attack, unspecified: Secondary | ICD-10-CM

## 2021-04-09 DIAGNOSIS — F320 Major depressive disorder, single episode, mild: Secondary | ICD-10-CM

## 2021-04-10 ENCOUNTER — Encounter: Payer: Self-pay | Admitting: Orthopedic Surgery

## 2021-04-10 DIAGNOSIS — D649 Anemia, unspecified: Secondary | ICD-10-CM | POA: Diagnosis not present

## 2021-04-10 LAB — BASIC METABOLIC PANEL
BUN: 25 — AB (ref 4–21)
CO2: 25 — AB (ref 13–22)
Chloride: 106 (ref 99–108)
Creatinine: 1 (ref 0.6–1.3)
Glucose: 86
Potassium: 4 (ref 3.4–5.3)
Sodium: 139 (ref 137–147)

## 2021-04-10 LAB — CBC: RBC: 4.82 (ref 3.87–5.11)

## 2021-04-10 LAB — COMPREHENSIVE METABOLIC PANEL
Albumin: 4 (ref 3.5–5.0)
Calcium: 8.7 (ref 8.7–10.7)
Globulin: 1.9

## 2021-04-10 LAB — HEPATIC FUNCTION PANEL
ALT: 28 (ref 10–40)
AST: 24 (ref 14–40)
Alkaline Phosphatase: 57 (ref 25–125)
Bilirubin, Total: 0.9

## 2021-04-10 LAB — CBC AND DIFFERENTIAL
HCT: 43 (ref 41–53)
Hemoglobin: 14.9 (ref 13.5–17.5)
Neutrophils Absolute: 5562
Platelets: 219 (ref 150–399)
WBC: 8.8

## 2021-04-10 NOTE — Progress Notes (Signed)
Location:  [facility] Room Number: [place] of Service:  ALF [phone]) Provider:  Windell Moulding, AGNP-C  Virgie Dad, MD  Patient Care Team: Virgie Dad, MD as PCP - General (Internal Medicine) Melina Modena, [facility] Crista Luria, MD as Consulting Physician (Dermatology) Marilynne Halsted, MD as Referring Physician (Ophthalmology) Bond, Tracie Harrier, MD as Referring Physician (Ophthalmology) Virgie Dad, MD as Referring Physician (Internal Medicine)  Extended Emergency Contact Information Primary Emergency Contact: Osvaldo Human of [place] Phone: [phone] Relation: Son  Code Status:  DNR Goals of care: Advanced Directive information Advanced Directives 04/03/2021  Does Patient Have a Medical Advance Directive? Yes  Type of Advance Directive Out of facility DNR (pink MOST or yellow form);Living will;Healthcare Power of Attorney  Does patient want to make changes to medical advance directive? No - Patient declined  Copy of [place] in Chart? Yes - validated most recent copy scanned in chart (See row information)  Would patient like information on creating a medical advance directive? -  Pre-existing out of facility DNR order (yellow form or pink MOST form) Yellow form placed in chart (order not valid for inpatient use)     Chief Complaint  Patient presents with   Acute Visit    Increased aphasia    HPI:  Pt is a 87 y.o. male seen today for acute visit due to increased aphasia and confusion.   Recently moved to [place]. He was unable to take care of himself or his apartment in [place]. Nursing reports increased aphasia in the evening. He is having more difficulty identifying objects and speaking clearly. ST reports difficulties during yesterdays session. He could not complete a sentence, was more perseverating and appeared confused. Detailed work up by neurology including EEG, Echo, MRI for atypical symptoms and visual  impairment.   Today, he was sitting in his recliner during encounter. He was alert to place, person and situation. He was able to describe his health conditions and why he moved to [place]. Able to complete sentences, some words were off.   09/16 MMSE 27/30.   HTN- BUN/creat 20/0.84 04/2020, Norvasc and enalapril daily HLD- LDL 67 04/2020, Lipitor daily TIA- neurology recommend bp control and statin Depression- flat affect today, refused medication for depression in past Chronic back pain- ambulates with walker, tylenol prn  No recent falls, injuries or behavioral outbursts.   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/18/2012   Deafness 10/18/2012   Endothelial corneal dystrophy    Essential hypertension 10/18/2012   Hammer toe 04/24/2014   Hyperlipidemia 10/18/2012   Intermediate stage nonexudative age-related macular degeneration of both eyes 04/16/2016   Nocturia 05/19/2016   Occlusion and stenosis of carotid artery with cerebral infarction    Other specified cardiac dysrhythmias(427.89)    Pain in joint, pelvic region and thigh    Paresthesia 04/30/2015   Toes of both feet    Peripheral vascular disease, unspecified ([place])    Primary open angle glaucoma 09/25/2011   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   Rosacea    Seborrheic keratosis 04/24/2014  Syncope 06/27/2013   TIA (transient ischemic attack) 10/18/2012   States he has had about 5  times   Urine frequency    Past Surgical History:  Procedure Laterality Date   CATARACT EXTRACTION  2005   bilateral   CORNEAL TRANSPLANT  2010 left; 2013 right eye   [place]    Left   HERNIA REPAIR  03/2004   Right   SHOULDER DEBRIDEMENT Left 2005   TONSILLECTOMY Bilateral childhood   TRANSURETHRAL RESECTION OF PROSTATE N/A 10/2003   VASECTOMY      No Known Allergies  Outpatient Encounter Medications as of 04/09/2021  Medication Sig   amLODipine (NORVASC) 5 MG tablet TAKE 1 TABLET DAILY   aspirin 325 MG tablet Take 325 mg by mouth every evening.   atorvastatin (LIPITOR) 40 MG tablet TAKE 1 TABLET DAILY   DORZOLAMIDE HCL-TIMOLOL MAL OP Apply 1 drop to eye 2 (two) times daily.   enalapril (VASOTEC) 20 MG tablet TAKE 1 TABLET TWICE A DAY TO CONTROL BLOOD PRESSURE   loratadine (CLARITIN) 10 MG tablet Take 10 mg by mouth daily.   Multiple Vitamins-Minerals (PRESERVISION AREDS 2) CAPS Take 1 capsule by mouth 2 (two) times daily.   Omega-3 Fatty Acids (FISH OIL) 1000 MG CAPS Take 1,000 mg by mouth 2 (two) times daily.    No facility-administered encounter medications on file as of 04/09/2021.    Review of Systems  Constitutional:  Negative for activity change, appetite change, fatigue and fever.  HENT:  Positive for hearing loss. Negative for dental problem and trouble swallowing.   Eyes:  Negative for visual disturbance.  Respiratory:  Negative for cough, shortness of breath and wheezing.   Cardiovascular:  Negative for chest pain and leg swelling.  Gastrointestinal:  Negative for abdominal distention, abdominal pain, constipation, diarrhea and nausea.  Genitourinary:  Negative for dysuria, frequency and hematuria.  Musculoskeletal:  Positive for back pain and gait problem.  Neurological:  Positive for speech difficulty and weakness. Negative for dizziness, seizures, numbness and headaches.  Psychiatric/Behavioral:  Positive for confusion and dysphoric mood. Negative for sleep disturbance. The patient is not nervous/anxious.    Immunization History  Administered Date(s) Administered   Influenza Whole 05/03/2002, 04/19/2012, 04/20/2013   Influenza, High Dose  Seasonal PF 05/03/2019   Influenza,inj,Quad PF,6+ Mos 04/21/2018   Influenza-Unspecified 05/03/2014, 04/18/2015, 04/30/2016, 04/18/2017, 04/09/2020   Moderna Sars-Covid-2 Vaccination 07/24/2019, 08/21/2019, 06/03/2020   Pneumococcal Conjugate-13 03/20/2010   Pneumococcal Polysaccharide-23 11/02/2017   Td 02/24/2018   Tdap 06/27/2011   Zoster Recombinat (Shingrix) 12/24/2017, 03/09/2018   Zoster, Live 11/30/2006   Pertinent  Health Maintenance Due  Topic Date Due   INFLUENZA VACCINE  02/17/2021   Fall Risk  12/04/2020 09/04/2020 08/15/2020 08/07/2020 05/15/2020  Falls in the past year? 0 0 1 0 0  Number falls in past yr: 0 0 1 0 0  Injury with Fall? - - 0 - -   Functional Status Survey:    Vitals:   04/10/21 1249  BP: 126/67  Pulse: 69  Resp: 20  Temp: (!) 96.7 F (35.9 C)  SpO2: 97%  Weight: 163 lb 3.2 oz (74 kg)  Height: 5\' 7"  (1.702 m)   Body mass index is 25.56 kg/m. Physical Exam Vitals reviewed.  Constitutional:      General: He is not in acute distress. HENT:     Head: Normocephalic.     Nose: Nose normal.     Mouth/Throat:     Mouth: Mucous membranes  are moist.  Eyes:     General:        Right eye: No discharge.        Left eye: No discharge.  Neck:     Vascular: No carotid bruit.  Cardiovascular:     Rate and Rhythm: Normal rate and regular rhythm.     Pulses: Normal pulses.     Heart sounds: Normal heart sounds. No murmur heard. Pulmonary:     Effort: Pulmonary effort is normal. No respiratory distress.     Breath sounds: Normal breath sounds. No wheezing.  Abdominal:     General: Bowel sounds are normal. There is no distension.     Palpations: Abdomen is soft.     Tenderness: There is no abdominal tenderness.  Musculoskeletal:     Cervical back: Normal range of motion.     Right lower leg: No edema.     Left lower leg: No edema.  Lymphadenopathy:     Cervical: No cervical adenopathy.  Skin:    General: Skin is warm and dry.     Capillary  Refill: Capillary refill takes less than 2 seconds.  Neurological:     General: No focal deficit present.     Mental Status: He is alert. Mental status is at baseline.     Motor: Weakness present.     Gait: Gait abnormal.  Psychiatric:        Mood and Affect: Mood normal. Affect is flat.        Behavior: Behavior normal.        Cognition and Memory: Memory is impaired.     Comments: aphasia    Labs reviewed: Recent Labs    05/09/20 0810  NA 140  K 4.2  CL 106  CO2 27  GLUCOSE 85  BUN 20  CREATININE 0.84  CALCIUM 8.6   Recent Labs    05/09/20 0810  AST 19  ALT 18  BILITOT 0.9  PROT 5.9*   Recent Labs    05/09/20 0810  WBC 7.4  HGB 14.9  HCT 43.5  MCV 89.5  PLT 187   Lab Results  Component Value Date   TSH 2.38 05/09/2020   No results found for: HGBA1C Lab Results  Component Value Date   CHOL 123 05/09/2020   HDL 43 05/09/2020   LDLCALC 67 05/09/2020   TRIG 54 05/09/2020   CHOLHDL 2.9 05/09/2020    Significant Diagnostic Results in last 30 days:  No results found.  Assessment/Plan 1. Aphasia - word finding, appears to be worse in evening  2. Cognitive impairment - increased confusion in evening- sundowning?? - 09/16 MMSE 27/30 - 2019 MRI brain revealed cerebral atrophy with moderate to advanced chronic small vessel ischemic disease - cont assisted living care  3. Essential hypertension - controlled - cont Norvasc and enalapril - cbc/diff - cmp  4. Hyperlipidemia, unspecified hyperlipidemia type - LDL 67 04/2020 - cont Lipitor  5. TIA (transient ischemic attack) - cont aspirin and Lipitor for prevention  6. Depression, major, single episode, mild ([place]) - flat affect - refused medication in the past  7. Chronic midline low back pain without sciatica - ambulating well - cont tylenol prn    Family/ staff Communication: plan discussed with patient and nurse  Labs/tests ordered:  cbc/diff, cmp

## 2021-04-21 ENCOUNTER — Other Ambulatory Visit: Payer: Self-pay

## 2021-04-21 ENCOUNTER — Emergency Department (HOSPITAL_COMMUNITY)
Admission: EM | Admit: 2021-04-21 | Discharge: 2021-04-21 | Disposition: A | Discharge status: Home / Self Care | Payer: Medicare Other | Attending: Emergency Medicine | Admitting: Emergency Medicine

## 2021-04-21 ENCOUNTER — Encounter (HOSPITAL_COMMUNITY): Payer: Self-pay

## 2021-04-21 DIAGNOSIS — I10 Essential (primary) hypertension: Secondary | ICD-10-CM | POA: Insufficient documentation

## 2021-04-21 DIAGNOSIS — Z7982 Long term (current) use of aspirin: Secondary | ICD-10-CM | POA: Diagnosis not present

## 2021-04-21 DIAGNOSIS — Z79899 Other long term (current) drug therapy: Secondary | ICD-10-CM | POA: Insufficient documentation

## 2021-04-21 DIAGNOSIS — Z87891 Personal history of nicotine dependence: Secondary | ICD-10-CM | POA: Diagnosis not present

## 2021-04-21 DIAGNOSIS — R55 Syncope and collapse: Secondary | ICD-10-CM

## 2021-04-21 LAB — CBC WITH DIFFERENTIAL/PLATELET
Abs Immature Granulocytes: 0.03 10*3/uL (ref 0.00–0.07)
Basophils Absolute: 0.1 10*3/uL (ref 0.0–0.1)
Basophils Relative: 1 %
Eosinophils Absolute: 0.3 10*3/uL (ref 0.0–0.5)
Eosinophils Relative: 4 %
HCT: 42.6 % (ref 39.0–52.0)
Hemoglobin: 14.5 g/dL (ref 13.0–17.0)
Immature Granulocytes: 0 %
Lymphocytes Relative: 14 %
Lymphs Abs: 1.1 10*3/uL (ref 0.7–4.0)
MCH: 31.1 pg (ref 26.0–34.0)
MCHC: 34 g/dL (ref 30.0–36.0)
MCV: 91.4 fL (ref 80.0–100.0)
Monocytes Absolute: 0.7 10*3/uL (ref 0.1–1.0)
Monocytes Relative: 8 %
Neutro Abs: 5.9 10*3/uL (ref 1.7–7.7)
Neutrophils Relative %: 73 %
Platelets: 192 10*3/uL (ref 150–400)
RBC: 4.66 MIL/uL (ref 4.22–5.81)
RDW: 13.6 % (ref 11.5–15.5)
WBC: 8.1 10*3/uL (ref 4.0–10.5)
nRBC: 0 % (ref 0.0–0.2)

## 2021-04-21 LAB — BASIC METABOLIC PANEL
Anion gap: 7 (ref 5–15)
BUN: 26 mg/dL — ABNORMAL HIGH (ref 8–23)
CO2: 22 mmol/L (ref 22–32)
Calcium: 8.6 mg/dL — ABNORMAL LOW (ref 8.9–10.3)
Chloride: 109 mmol/L (ref 98–111)
Creatinine, Ser: 0.93 mg/dL (ref 0.61–1.24)
GFR, Estimated: >60 mL/min (ref >60)
Glucose, Bld: 90 mg/dL (ref 70–99)
Potassium: 4.1 mmol/L (ref 3.5–5.1)
Sodium: 138 mmol/L (ref 135–145)

## 2021-04-21 LAB — TROPONIN I (HIGH SENSITIVITY): Troponin I (High Sensitivity): 5 ng/L (ref <18)

## 2021-04-21 LAB — CBG MONITORING, ED: Glucose-Capillary: 141 mg/dL — ABNORMAL HIGH (ref 70–99)

## 2021-04-21 NOTE — ED Triage Notes (Addendum)
Pt BIBA from [facility] . Per staff, pt went unresponsive while eating. Staff notes pt's BP was 64 systolic with low HR   Hx of different size pupils; large raised area on head is normal  VS for EMS 182/76 HR 65 98% RA  18 L hand   Pt at baseline currently per EMS

## 2021-04-21 NOTE — ED Provider Notes (Signed)
[facility] DEPT Provider Note   CSN: [id number] Arrival date & time: 04/21/21  1008     History Chief Complaint  Patient presents with   Altered Mental Status    Russell [name] is a 87 y.o. male.  87 year old male with prior medical history as detailed below presents for evaluation.  Patient presents from his facility where he had a brief period of unresponsiveness.  Patient apparently was eating a meal.  He became unresponsive.  Patient does not recall this event.  He is currently without complaint.  He is comfortable.  He denies associated chest pain or shortness of breath.  He reports that he did not really want to be at the hospital today.  The history is provided by the patient and the EMS personnel.  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/18/2012   Deafness 10/18/2012   Endothelial corneal dystrophy    Essential hypertension 10/18/2012   Hammer toe 04/24/2014   Hyperlipidemia 10/18/2012   Intermediate stage nonexudative age-related macular degeneration of both eyes 04/16/2016   Nocturia 05/19/2016   Occlusion and stenosis of carotid artery with cerebral infarction    Other specified cardiac dysrhythmias(427.89)    Pain in joint, pelvic region and thigh    Paresthesia 04/30/2015   Toes of both feet    Peripheral vascular disease, unspecified ([place])    Primary open angle glaucoma 09/25/2011   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   Rosacea    Seborrheic keratosis 04/24/2014   Syncope 06/27/2013   TIA (transient  ischemic attack) 10/18/2012   States he has had about 5  times   Urine frequency     Patient Active Problem List   Diagnosis Date Noted   Encounter for postoperative wound care 01/09/2020   Depression, major, single episode, mild ([place]) 06/07/2019   TIA (transient ischemic attack) 01/05/2018   Nocturia 05/19/2016   Intermediate stage nonexudative age-related macular degeneration of both eyes 04/16/2016   Pain in joint, shoulder region 10/29/2015   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   Paresthesia 04/30/2015   Anisocoria 10/30/2014   Hammer toe 04/24/2014   Pain in knee 04/24/2014   Seborrheic keratosis 04/24/2014   Cervicalgia 04/25/2013   Central retinal edema, cystoid 11/18/2012   Cyclitis, chronic 11/18/2012   Essential hypertension 10/18/2012   Hyperlipidemia 10/18/2012   Mixed conductive and sensorineural hearing loss 10/18/2012   Pain in joint, pelvic region and thigh 10/18/2012   Other specified cardiac dysrhythmias(427.89) 10/18/2012   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   Rosacea 10/18/2012   Actinic keratosis 10/18/2012   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 04/25/2012   Atrophy, Fuchs' 12/10/2011   Primary open angle glaucoma 09/25/2011    Past Surgical History:  Procedure Laterality Date   CATARACT EXTRACTION  2005   bilateral   CORNEAL TRANSPLANT  2010 left; 2013 right eye   HERNIA REPAIR  1985   Left   HERNIA REPAIR  03/2004   Right   SHOULDER DEBRIDEMENT Left 2005   TONSILLECTOMY Bilateral childhood   TRANSURETHRAL RESECTION OF PROSTATE N/A 10/2003  VASECTOMY         Family History  Problem Relation Age of Onset   Cancer Mother    Heart disease Father     Social History   Tobacco Use   Smoking status: Former    Packs/day: 1.00    Years: 25.00    Pack years: 25.00    Types: Cigarettes    Quit date: 10/18/1964    Years since quitting: 56.5   Smokeless tobacco: Never   Tobacco comments:    quit 1966  Vaping Use   Vaping Use: Never used  Substance Use  Topics   Alcohol use: No    Alcohol/week: 1.0 standard drink    Types: 1 Glasses of wine per week    Comment: very seldom   Drug use: No    Home Medications Prior to Admission medications   Medication Sig Start Date End Date Taking? Authorizing Provider  amLODipine (NORVASC) 5 MG tablet TAKE 1 TABLET DAILY 02/12/21   Virgie Dad, MD  aspirin 325 MG tablet Take 325 mg by mouth every evening.    [provider]  atorvastatin (LIPITOR) 40 MG tablet TAKE 1 TABLET DAILY 11/11/20   Mast, Man X, NP  DORZOLAMIDE HCL-TIMOLOL MAL OP Apply 1 drop to eye 2 (two) times daily.    [provider]  enalapril (VASOTEC) 20 MG tablet TAKE 1 TABLET TWICE A DAY TO CONTROL BLOOD PRESSURE 09/23/20   Virgie Dad, MD  loratadine (CLARITIN) 10 MG tablet Take 10 mg by mouth daily.    [provider]  Multiple Vitamins-Minerals (PRESERVISION AREDS 2) CAPS Take 1 capsule by mouth 2 (two) times daily.    [provider]  Omega-3 Fatty Acids (FISH OIL) 1000 MG CAPS Take 1,000 mg by mouth 2 (two) times daily.     [provider]    Allergies    Patient has no known allergies.  Review of Systems   Review of Systems  Cardiovascular:  Positive for syncope.  All other systems reviewed and are negative.  Physical Exam Updated Vital Signs BP (!) 168/85 (BP Location: Left Arm)   Pulse 62   Temp (!) 97.4 F (36.3 C) (Oral)   Resp 14   SpO2 98%   Physical Exam Vitals and nursing note reviewed.  Constitutional:      General: He is not in acute distress.    Appearance: Normal appearance. He is well-developed.  HENT:     Head: Normocephalic and atraumatic.  Eyes:     Conjunctiva/sclera: Conjunctivae normal.     Pupils: Pupils are equal, round, and reactive to light.  Cardiovascular:     Rate and Rhythm: Normal rate and regular rhythm.     Heart sounds: Normal heart sounds.  Pulmonary:     Effort: Pulmonary effort is normal. No respiratory distress.     Breath  sounds: Normal breath sounds.  Abdominal:     General: There is no distension.     Palpations: Abdomen is soft.     Tenderness: There is no abdominal tenderness.  Musculoskeletal:        General: No deformity. Normal range of motion.     Cervical back: Normal range of motion and neck supple.  Skin:    General: Skin is warm and dry.  Neurological:     General: No focal deficit present.     Mental Status: He is alert and oriented to person, place, and time. Mental status is at baseline.  Cranial Nerves: No cranial nerve deficit.     Sensory: No sensory deficit.     Motor: No weakness.     Coordination: Coordination normal.    ED Results / Procedures / Treatments   Labs (all labs ordered are listed, but only abnormal results are displayed) Labs Reviewed  CBG MONITORING, ED - Abnormal; Notable for the following components:      Result Value   Glucose-Capillary 141 (*)    All other components within normal limits  BASIC METABOLIC PANEL  CBC WITH DIFFERENTIAL/PLATELET  TROPONIN I (HIGH SENSITIVITY)    EKG EKG Interpretation  Date/Time:  Monday April 21 2021 10:27:40 EDT Ventricular Rate:  65 PR Interval:  206 QRS Duration: 88 QT Interval:  426 QTC Calculation: 443 R Axis:   35 Text Interpretation: Normal sinus rhythm Normal ECG Confirmed by Dene Gentry [phone]) on 04/21/2021 10:29:32 AM  Radiology No results found.  Procedures Procedures   Medications Ordered in ED Medications - No data to display  ED Course  I have reviewed the triage vital signs and the nursing notes.  Pertinent labs & imaging results that were available during my care of the patient were reviewed by me and considered in my medical decision making (see chart for details).    MDM Rules/Calculators/A&P                           MDM  MSE complete  Russell [name] was evaluated in Emergency Department on 04/21/2021 for the symptoms described in the history of present illness. He was  evaluated in the context of the global COVID-19 pandemic, which necessitated consideration that the patient might be at risk for infection with the SARS-CoV-2 virus that causes COVID-19. Institutional protocols and algorithms that pertain to the evaluation of patients at risk for COVID-19 are in a state of rapid change based on information released by regulatory bodies including the CDC and federal and state organizations. These policies and algorithms were followed during the patient's care in the ED.   Patient presented for evaluation following reported brief episode of syncope.  Patient is baseline upon arrival to the ED.  Patient son is present during evaluation.  Screening labs obtained are without abnormality.  Patient is at his baseline. He is without complaint. The patient repeatedly expresses his disinterest in remaining in the hospital. Patient's son declines admission of the patient for observation or continued work-up.  Patient's son does understand need for close follow-up.  Strict return precautions given and understood.  Final Clinical Impression(s) / ED Diagnoses Final diagnoses:  Syncope, unspecified syncope type    Rx / DC Orders ED Discharge Orders     None        Valarie Merino, MD 04/21/21 1333

## 2021-04-21 NOTE — Discharge Instructions (Addendum)
Return for any problem.  ?

## 2021-04-22 ENCOUNTER — Encounter: Payer: Self-pay | Admitting: Nurse Practitioner

## 2021-04-22 ENCOUNTER — Non-Acute Institutional Stay: Payer: Medicare Other | Admitting: Nurse Practitioner

## 2021-04-22 DIAGNOSIS — I10 Essential (primary) hypertension: Secondary | ICD-10-CM

## 2021-04-22 DIAGNOSIS — G8929 Other chronic pain: Secondary | ICD-10-CM

## 2021-04-22 DIAGNOSIS — M545 Low back pain, unspecified: Secondary | ICD-10-CM

## 2021-04-22 DIAGNOSIS — F320 Major depressive disorder, single episode, mild: Secondary | ICD-10-CM

## 2021-04-22 DIAGNOSIS — G459 Transient cerebral ischemic attack, unspecified: Secondary | ICD-10-CM | POA: Diagnosis not present

## 2021-04-22 DIAGNOSIS — I69919 Unspecified symptoms and signs involving cognitive functions following unspecified cerebrovascular disease: Secondary | ICD-10-CM | POA: Diagnosis not present

## 2021-04-22 DIAGNOSIS — E785 Hyperlipidemia, unspecified: Secondary | ICD-10-CM

## 2021-04-22 NOTE — Assessment & Plan Note (Signed)
akes Atorvastatin.

## 2021-04-22 NOTE — Assessment & Plan Note (Signed)
refused antidepressant.

## 2021-04-22 NOTE — Progress Notes (Signed)
Location:   [facility] Room Number: 29-A Place of Service:  ALF (13) Provider: Marlana Latus NP   Patient Care Team: Virgie Dad, MD as PCP - General (Internal Medicine) Melina Modena, [facility] Crista Luria, MD as Consulting Physician (Dermatology) Marilynne Halsted, MD as Referring Physician (Ophthalmology) Bond, Tracie Harrier, MD as Referring Physician (Ophthalmology) Virgie Dad, MD as Referring Physician (Internal Medicine)  Extended Emergency Contact Information Primary Emergency Contact: Osvaldo Human of [place] Phone: [phone] Relation: Son  Code Status:  DNR Goals of care: Advanced Directive information Advanced Directives 04/22/2021  Does Patient Have a Medical Advance Directive? Yes  Type of Paramedic of [place];Living will;Out of facility DNR (pink MOST or yellow form)  Does patient want to make changes to medical advance directive? No - Patient declined  Copy of [place] in Chart? Yes - validated most recent copy scanned in chart (See row information)  Would patient like information on creating a medical advance directive? -  Pre-existing out of facility DNR order (yellow form or pink MOST form) -     Chief Complaint  Patient presents with   Hospitalization Follow-up    Follow up ED.     HPI:  Pt is a 87 y.o. male seen today for an acute visit for f/u ED eval 04/21/21 for AMS, Syncope, unresponsiveness, resolved in ED,  CBC/diff, BMP, Troponin I, EKG in ED unremarkable.   Hx of TIA, ASA, statin  HTN, takes Amlodipine, Enalapril, Bun/creat 26/0.93 eGFR >60 04/21/21  Depression, refused antidepressant.   Hyperlipidemia, takes Atorvastatin.   Lower back pain, takes Tylenol.   Memory deficit, MMSE 27/30 04/04/21, MRI 2019 chronic small vessel ischemic disease.    Past Medical History:  Diagnosis Date   Actinic keratosis 10/18/2012   Anisocoria 10/30/2014   Left pupil is larger  than the right postsurgery corneal transplant.    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/18/2012   Deafness 10/18/2012   Endothelial corneal dystrophy    Essential hypertension 10/18/2012   Hammer toe 04/24/2014   Hyperlipidemia 10/18/2012   Intermediate stage nonexudative age-related macular degeneration of both eyes 04/16/2016   Nocturia 05/19/2016   Occlusion and stenosis of carotid artery with cerebral infarction    Other specified cardiac dysrhythmias(427.89)    Pain in joint, pelvic region and thigh    Paresthesia 04/30/2015   Toes of both feet    Peripheral vascular disease, unspecified ([place])    Primary open angle glaucoma 09/25/2011   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   Rosacea    Seborrheic keratosis 04/24/2014   Syncope 06/27/2013   TIA (transient ischemic attack) 10/18/2012   States he has had about 5  times   Urine frequency    Past Surgical History:  Procedure Laterality Date   CATARACT EXTRACTION  2005   bilateral   CORNEAL TRANSPLANT  2010 left; 2013 right eye   HERNIA REPAIR  1985   Left   HERNIA REPAIR  03/2004   Right   SHOULDER DEBRIDEMENT Left 2005   TONSILLECTOMY Bilateral childhood   TRANSURETHRAL RESECTION OF PROSTATE N/A 10/2003   VASECTOMY      No Known Allergies  Allergies as of 04/22/2021   No Known Allergies      Medication List        Accurate as of April 22, 2021  4:08 PM.  If you have any questions, ask your nurse or doctor.          STOP taking these medications    Fish Oil 1000 MG Caps Stopped by: Natalia Wittmeyer X Rakesh Dutko, NP   loratadine 10 MG tablet Commonly known as: CLARITIN Stopped by: Elvera Almario X Heaven Meeker, NP       TAKE these medications    amLODipine 5 MG tablet Commonly known as: NORVASC TAKE 1 TABLET DAILY   aspirin 325 MG tablet Take 325 mg by mouth every evening.   atorvastatin 40 MG tablet Commonly known as: LIPITOR TAKE 1  TABLET DAILY   DORZOLAMIDE HCL-TIMOLOL MAL OP Apply 1 drop to eye 2 (two) times daily.   enalapril 20 MG tablet Commonly known as: VASOTEC Take 20 mg by mouth in the morning. What changed: Another medication with the same name was removed. Continue taking this medication, and follow the directions you see here. Changed by: Citlali Gautney X Envi Eagleson, NP   PreserVision AREDS 2 Caps Take 1 capsule by mouth 2 (two) times daily.        Review of Systems  Constitutional:  Positive for fatigue. Negative for appetite change and fever.  HENT:  Positive for hearing loss. Negative for congestion.   Eyes:  Negative for visual disturbance.  Respiratory:  Negative for cough and shortness of breath.   Cardiovascular:  Positive for leg swelling.  Gastrointestinal:  Negative for abdominal pain, nausea and vomiting.  Genitourinary:  Negative for dysuria.  Musculoskeletal:  Positive for arthralgias, back pain and gait problem.  Skin:  Negative for color change.  Neurological:  Positive for syncope. Negative for speech difficulty, weakness and headaches.       Memory lapses.   Psychiatric/Behavioral:  Negative for behavioral problems and hallucinations. The patient is not nervous/anxious.    Immunization History  Administered Date(s) Administered   Influenza Whole 05/03/2002, 04/19/2012, 04/20/2013   Influenza, High Dose Seasonal PF 05/03/2019   Influenza,inj,Quad PF,6+ Mos 04/21/2018   Influenza-Unspecified 05/03/2014, 04/18/2015, 04/30/2016, 04/18/2017, 04/09/2020   Moderna Sars-Covid-2 Vaccination 07/24/2019, 08/21/2019, 06/03/2020, 04/09/2021   Pneumococcal Conjugate-13 03/20/2010   Pneumococcal Polysaccharide-23 11/02/2017   Td 02/24/2018   Tdap 06/27/2011   Zoster Recombinat (Shingrix) 12/24/2017, 03/09/2018   Zoster, Live 11/30/2006   Pertinent  Health Maintenance Due  Topic Date Due   INFLUENZA VACCINE  02/17/2021   Fall Risk  12/04/2020 09/04/2020 08/15/2020 08/07/2020 05/15/2020  Falls in the  past year? 0 0 1 0 0  Number falls in past yr: 0 0 1 0 0  Injury with Fall? - - 0 - -   Functional Status Survey:    Vitals:   04/22/21 1506  BP: 140/80  Pulse: 73  Resp: 18  Temp: (!) 97.1 F (36.2 C)  SpO2: 98%  Weight: 163 lb 6.4 oz (74.1 kg)  Height: _0  (1.702 m)   Body mass index is 25.59 kg/m. Physical Exam Vitals and nursing note reviewed.  Constitutional:      Appearance: Normal appearance.  HENT:     Head: Normocephalic and atraumatic.     Nose: Nose normal.     Mouth/Throat:     Mouth: Mucous membranes are moist.  Eyes:     Extraocular Movements: Extraocular movements intact.     Conjunctiva/sclera: Conjunctivae normal.     Comments: Left pupil is bigger than the right   Cardiovascular:     Rate and Rhythm: Normal rate and regular rhythm.     Heart sounds: No murmur heard.  Pulmonary:     Effort: Pulmonary effort is normal.     Breath sounds: No wheezing, rhonchi or rales.  Abdominal:     General: Bowel sounds are normal.     Palpations: Abdomen is soft.     Tenderness: There is no abdominal tenderness.  Musculoskeletal:     Cervical back: Normal range of motion and neck supple.     Right lower leg: Edema present.     Left lower leg: Edema present.     Comments: Trace edema BLE  Skin:    General: Skin is warm and dry.  Neurological:     General: No focal deficit present.     Mental Status: He is alert and oriented to person, place, and time. Mental status is at baseline.     Motor: No weakness.     Coordination: Coordination normal.     Gait: Gait normal.  Psychiatric:        Mood and Affect: Mood normal.        Thought Content: Thought content normal.        Judgment: Judgment normal.    Labs reviewed: Recent Labs    05/09/20 0810 04/21/21 1119  NA 140 138  K 4.2 4.1  CL 106 109  CO2 27 22  GLUCOSE 85 90  BUN 20 26*  CREATININE 0.84 0.93  CALCIUM 8.6 8.6*   Recent Labs    05/09/20 0810  AST 19  ALT 18  BILITOT 0.9  PROT  5.9*   Recent Labs    05/09/20 0810 04/21/21 1119  WBC 7.4 8.1  NEUTROABS  --  5.9  HGB 14.9 14.5  HCT 43.5 42.6  MCV 89.5 91.4  PLT 187 192   Lab Results  Component Value Date   TSH 2.38 05/09/2020   No results found for: HGBA1C Lab Results  Component Value Date   CHOL 123 05/09/2020   HDL 43 05/09/2020   LDLCALC 67 05/09/2020   TRIG 54 05/09/2020   CHOLHDL 2.9 05/09/2020    Significant Diagnostic Results in last 30 days:  No results found.  Assessment/Plan TIA (transient ischemic attack)  ED eval 04/21/21 for AMS, Syncope, unresponsiveness, resolved in ED,  CBC/diff, BMP, Troponin I, EKG in ED unremarkable.   Hx of TIA, ASA, statin  The patient is in his usual state of health today.   Essential hypertension Blood pressure is controlled, takes Amlodipine, Enalapril  Depression, major, single episode, mild (HCC) refused antidepressant.   Hyperlipidemia akes Atorvastatin.   Chronic lower back pain Managed, ambulates with walker, takes Tylenol.   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 AL for supportive care    Family/ staff Communication: plan of care reviewed with the patient and charge nurse.   Labs/tests ordered:  none  Time spend 40 minutes.

## 2021-04-22 NOTE — Assessment & Plan Note (Signed)
ED eval 04/21/21 for AMS, Syncope, unresponsiveness, resolved in ED,  CBC/diff, BMP, Troponin I, EKG in ED unremarkable.   Hx of TIA, ASA, statin  The patient is in his usual state of health today.

## 2021-04-22 NOTE — Assessment & Plan Note (Signed)
Blood pressure is controlled, takes Amlodipine, Enalapril

## 2021-04-22 NOTE — Assessment & Plan Note (Signed)
MMSE 27/30 04/04/21, MRI 2019 chronic small vessel ischemic disease. AL for supportive care

## 2021-04-22 NOTE — Assessment & Plan Note (Signed)
Managed, ambulates with walker, takes Tylenol.

## 2021-04-30 ENCOUNTER — Encounter: Payer: Medicare Other | Admitting: Internal Medicine

## 2021-05-07 ENCOUNTER — Encounter: Payer: Medicare Other | Admitting: Internal Medicine

## 2021-05-29 ENCOUNTER — Non-Acute Institutional Stay: Payer: Medicare Other | Admitting: Internal Medicine

## 2021-05-29 DIAGNOSIS — F32A Depression, unspecified: Secondary | ICD-10-CM | POA: Diagnosis not present

## 2021-05-29 DIAGNOSIS — M545 Low back pain, unspecified: Secondary | ICD-10-CM

## 2021-05-29 DIAGNOSIS — E785 Hyperlipidemia, unspecified: Secondary | ICD-10-CM

## 2021-05-29 DIAGNOSIS — I10 Essential (primary) hypertension: Secondary | ICD-10-CM

## 2021-05-29 DIAGNOSIS — R4701 Aphasia: Secondary | ICD-10-CM

## 2021-05-29 DIAGNOSIS — G8929 Other chronic pain: Secondary | ICD-10-CM

## 2021-05-29 DIAGNOSIS — I69919 Unspecified symptoms and signs involving cognitive functions following unspecified cerebrovascular disease: Secondary | ICD-10-CM

## 2021-05-29 DIAGNOSIS — L719 Rosacea, unspecified: Secondary | ICD-10-CM

## 2021-05-29 DIAGNOSIS — G459 Transient cerebral ischemic attack, unspecified: Secondary | ICD-10-CM | POA: Diagnosis not present

## 2021-05-29 NOTE — Progress Notes (Signed)
Location: [place] Room Number: 29 Place of Service:  ALF [phone])  Provider: Veleta Miners MD  Code Status: DNR Goals of Care:  Advanced Directives 05/30/2021  Does Patient Have a Medical Advance Directive? Yes  Type of Advance Directive Out of facility DNR (pink MOST or yellow form);Living will;Healthcare Power of Attorney  Does patient want to make changes to medical advance directive? No - Patient declined  Copy of [place] in Chart? Yes - validated most recent copy scanned in chart (See row information)  Would patient like information on creating a medical advance directive? -  Pre-existing out of facility DNR order (yellow form or pink MOST form) Yellow form placed in chart (order not valid for inpatient use)     Chief Complaint  Patient presents with   Acute Visit    Confusion    HPI: Patient is a 87 y.o. male seen today for an acute visit for Confusion and Worsening Cognition  Patient has h/o Hypertension and Depression, TIA, Skin cancer Also has h/o TIA versus migraine Had detailed work-up by neurology for his atypical symptoms of visual impairment. With Aphasia and Word finding.He has had detail work ups including EEG, Echo and MRI .which have been Negative. They have recommended Asprin, BP control and Statin.  [place] due to more confusion and unable to take care of himself Nurses have noticed worsenign since been here. Urinary incontinenece sometimes does not respond Gets very confused walking around though responds to Direction Sleeps in his room most of the time Not very interactive  He told me that he is depressed. He lost his Wife and daughter many years ago. Has never gotten ovet that. He say what is the point of him living. Continues ot walk with the walker. No falls. 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/18/2012   Deafness 10/18/2012   Endothelial corneal dystrophy    Essential hypertension 10/18/2012   Hammer toe 04/24/2014   Hyperlipidemia 10/18/2012   Intermediate stage nonexudative age-related macular degeneration of both eyes 04/16/2016   Nocturia 05/19/2016   Occlusion and stenosis of carotid artery with cerebral infarction    Other specified cardiac dysrhythmias(427.89)    Pain in joint, pelvic region and thigh    Paresthesia 04/30/2015   Toes of both feet    Peripheral vascular disease, unspecified ([place])    Primary open angle glaucoma 09/25/2011   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   Rosacea    Seborrheic keratosis 04/24/2014   Syncope 06/27/2013   TIA (transient ischemic attack) 10/18/2012   States he has had about 5  times   Urine frequency     Past Surgical History:  Procedure Laterality Date   CATARACT EXTRACTION  2005   bilateral   CORNEAL TRANSPLANT  2010 left; 2013 right eye   HERNIA REPAIR  1985   Left   HERNIA REPAIR  03/2004   Right   SHOULDER DEBRIDEMENT Left 2005   TONSILLECTOMY Bilateral childhood   TRANSURETHRAL RESECTION OF PROSTATE N/A 10/2003   VASECTOMY      No Known Allergies  Outpatient Encounter Medications as of 05/29/2021  Medication Sig  amLODipine (NORVASC) 5 MG tablet TAKE 1 TABLET DAILY   aspirin 325 MG tablet Take 325 mg by mouth every evening.   atorvastatin (LIPITOR) 40 MG tablet TAKE 1 TABLET DAILY   buPROPion (WELLBUTRIN) 75 MG tablet Take 75 mg by mouth 2 (two) times daily.   DORZOLAMIDE HCL-TIMOLOL MAL OP Apply 1 drop to eye 2 (two) times daily.   enalapril (VASOTEC) 20 MG tablet Take 20 mg by mouth in the morning.   loratadine (CLARITIN) 10 MG tablet Take 10 mg by mouth daily.   Multiple Vitamins-Minerals (PRESERVISION  AREDS 2) CAPS Take 1 capsule by mouth 2 (two) times daily.   Omega-3 Fatty Acids (FISH OIL) 1000 MG CAPS Take 1 capsule by mouth 2 (two) times daily.   No facility-administered encounter medications on file as of 05/29/2021.    Review of Systems:  Review of Systems  Constitutional:  Positive for activity change and appetite change.  HENT: Negative.    Respiratory: Negative.    Cardiovascular:  Positive for leg swelling.  Gastrointestinal: Negative.   Genitourinary: Negative.   Musculoskeletal: Negative.   Skin: Negative.   Neurological:  Positive for weakness.  Psychiatric/Behavioral:  Positive for behavioral problems, confusion, dysphoric mood and sleep disturbance.    Health Maintenance  Topic Date Due   INFLUENZA VACCINE  02/17/2021   COVID-19 Vaccine (5 - Booster for Moderna series) 06/04/2021   TETANUS/TDAP  02/25/2028   Pneumonia Vaccine 78+ Years old  Completed   Zoster Vaccines- Shingrix  Completed   HPV VACCINES  Aged Out    Physical Exam: Vitals:   05/30/21 0840  BP: (!) 149/62  Pulse: 67  Resp: 19  Temp: (!) 97.5 F (36.4 C)  SpO2: 94%  Weight: 163 lb 12.8 oz (74.3 kg)  Height: 5\' 7"  (1.702 m)   Body mass index is 25.65 kg/m. Physical Exam Vitals reviewed.  Constitutional:      Appearance: Normal appearance.     Comments: Seemed Dazed   HENT:     Head: Normocephalic.     Nose: Nose normal.     Mouth/Throat:     Mouth: Mucous membranes are moist.     Pharynx: Oropharynx is clear.  Eyes:     Pupils: Pupils are equal, round, and reactive to light.  Cardiovascular:     Rate and Rhythm: Normal rate and regular rhythm.     Pulses: Normal pulses.     Heart sounds: Normal heart sounds.  Pulmonary:     Effort: Pulmonary effort is normal.     Breath sounds: Normal breath sounds.  Abdominal:     General: Abdomen is flat. Bowel sounds are normal.     Palpations: Abdomen is soft.  Musculoskeletal:     Cervical back: Neck supple.     Comments: Mild  swelling Bilateral  Skin:    General: Skin is warm.  Neurological:     General: No focal deficit present.     Mental Status: He is alert.     Comments: Alert Did recognize me. Speech was not Slurred But was very confused. Withdrawn No Focal deficits noticed Walking with his walker     Labs reviewed: Basic Metabolic Panel: Recent Labs    04/21/21 1119  NA 138  K 4.1  CL 109  CO2 22  GLUCOSE 90  BUN 26*  CREATININE 0.93  CALCIUM 8.6*   Liver Function Tests: No results for input(s): AST, ALT, ALKPHOS, BILITOT, PROT, ALBUMIN in the last 8760 hours. No results  for input(s): LIPASE, AMYLASE in the last 8760 hours. No results for input(s): AMMONIA in the last 8760 hours. CBC: Recent Labs    04/21/21 1119  WBC 8.1  NEUTROABS 5.9  HGB 14.5  HCT 42.6  MCV 91.4  PLT 192   Lipid Panel: No results for input(s): CHOL, HDL, LDLCALC, TRIG, CHOLHDL, LDLDIRECT in the last 8760 hours. No results found for: HGBA1C  Procedures since last visit: No results found.  Assessment/Plan Acute depression He has h/o Depression but has refused antidepressant in the past Will start on Wellbutrin 75 mg BID.  TIA (transient ischemic attack) On aspirin If continues to get worsen in cognition will make Neurology Follow up He has refused work up in the past Essential hypertension Doing well on Amlodipine and Vasotec Hyperlipidemia, unspecified hyperlipidemia type On statin LDL in 9/22 was 73 Chronic midline low back pain without sciatica Tylenol pRn Cognitive deficit  Continue Supportive care for now MMSE was 27/30 in 9/22 Rosacea Does not want Ointment     Labs/tests ordered:   Total time spent in this patient care encounter was  45_  minutes; greater than 50% of the visit spent counseling patient and staff, reviewing records , Labs and coordinating care for problems addressed at this encounter.   Next appt:  08/19/2021

## 2021-05-30 ENCOUNTER — Encounter: Payer: Self-pay | Admitting: Internal Medicine

## 2021-06-04 ENCOUNTER — Encounter: Payer: Self-pay | Admitting: Internal Medicine

## 2021-07-08 ENCOUNTER — Non-Acute Institutional Stay: Payer: Medicare Other | Admitting: Nurse Practitioner

## 2021-07-08 ENCOUNTER — Encounter: Payer: Self-pay | Admitting: Nurse Practitioner

## 2021-07-08 DIAGNOSIS — G8929 Other chronic pain: Secondary | ICD-10-CM

## 2021-07-08 DIAGNOSIS — L719 Rosacea, unspecified: Secondary | ICD-10-CM

## 2021-07-08 DIAGNOSIS — G459 Transient cerebral ischemic attack, unspecified: Secondary | ICD-10-CM

## 2021-07-08 DIAGNOSIS — E785 Hyperlipidemia, unspecified: Secondary | ICD-10-CM | POA: Diagnosis not present

## 2021-07-08 DIAGNOSIS — M545 Low back pain, unspecified: Secondary | ICD-10-CM | POA: Diagnosis not present

## 2021-07-08 DIAGNOSIS — F339 Major depressive disorder, recurrent, unspecified: Secondary | ICD-10-CM

## 2021-07-08 DIAGNOSIS — I10 Essential (primary) hypertension: Secondary | ICD-10-CM

## 2021-07-08 DIAGNOSIS — K5904 Chronic idiopathic constipation: Secondary | ICD-10-CM

## 2021-07-08 DIAGNOSIS — I69919 Unspecified symptoms and signs involving cognitive functions following unspecified cerebrovascular disease: Secondary | ICD-10-CM | POA: Diagnosis not present

## 2021-07-08 NOTE — Assessment & Plan Note (Signed)
MMSE 27/30 04/04/21, MRI 2019 chronic small vessel ischemic disease.

## 2021-07-08 NOTE — Assessment & Plan Note (Signed)
takes Tylenol    

## 2021-07-08 NOTE — Assessment & Plan Note (Signed)
on Senokot II qhs

## 2021-07-08 NOTE — Assessment & Plan Note (Signed)
scaly appearance.

## 2021-07-08 NOTE — Progress Notes (Signed)
Location:   [place] Room Number: 25 Place of Service:  ALF (13) Provider: Lennie Odor Caliya Narine NP  Virgie Dad, MD  Patient Care Team: Virgie Dad, MD as PCP - General (Internal Medicine) Melina Modena, [facility] Crista Luria, MD as Consulting Physician (Dermatology) Marilynne Halsted, MD as Referring Physician (Ophthalmology) Bond, Tracie Harrier, MD as Referring Physician (Ophthalmology) Virgie Dad, MD as Referring Physician (Internal Medicine)  Extended Emergency Contact Information Primary Emergency Contact: Tawny Hopping [place] Phone: [phone] Relation: Son  Code Status:  DNR Goals of care: Advanced Directive information Advanced Directives 05/30/2021  Does Patient Have a Medical Advance Directive? Yes  Type of Advance Directive Out of facility DNR (pink MOST or yellow form);Living will;Healthcare Power of Attorney  Does patient want to make changes to medical advance directive? No - Patient declined  Copy of [place] in Chart? Yes - validated most recent copy scanned in chart (See row information)  Would patient like information on creating a medical advance directive? -  Pre-existing out of facility DNR order (yellow form or pink MOST form) Yellow form placed in chart (order not valid for inpatient use)     Chief Complaint  Patient presents with   Medical Management of Chronic Issues    HPI:  Pt is a 87 y.o. male seen today for medical management of chronic diseases.     Hx of TIA, ASA, statin, f/u Neurology             HTN, takes Amlodipine, Enalapril, Bun/creat 26/0.93 eGFR >60 04/21/21             Depression, refused antidepressant in the past. Currently on Wellbutrin 179m qd since 06/19/21(started Wellbutrin 769mbid 05/29/21). Reported the patient gets up midnight, dressed, looks for breakfast. Melatonin has little efficacy, the patient stated he didn't want sleeping pill, staying up at night does not  bother him, may bother staff. TSH 2.73 04/07/21             Hyperlipidemia, takes Atorvastatin. LDL 73 04/07/21             Lower back pain, takes Tylenol.              Memory deficit, MMSE 27/30 04/04/21, MRI 2019 chronic small vessel ischemic disease.   Rosacea, scaly appearance.   Constipation, on Senokot II qhs  Past Medical History:  Diagnosis Date   Actinic keratosis 10/18/2012   Anisocoria 10/30/2014   Left pupil is larger than the right postsurgery corneal transplant.    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/18/2012   Deafness 10/18/2012   Endothelial corneal dystrophy    Essential hypertension 10/18/2012   Hammer toe 04/24/2014   Hyperlipidemia 10/18/2012   Intermediate stage nonexudative age-related macular degeneration of both eyes 04/16/2016   Nocturia 05/19/2016   Occlusion and stenosis of carotid artery with cerebral infarction    Other specified cardiac dysrhythmias(427.89)    Pain in joint, pelvic region and thigh    Paresthesia 04/30/2015   Toes of both feet    Peripheral vascular disease, unspecified ([place]   Primary open angle glaucoma 09/25/2011   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   Rosacea    Seborrheic keratosis 04/24/2014   Syncope 06/27/2013   TIA (transient ischemic attack) 10/18/2012   States he has had about 5  times   Urine  frequency    Past Surgical History:  Procedure Laterality Date   CATARACT EXTRACTION  2005   bilateral   CORNEAL TRANSPLANT  2010 left; 2013 right eye   HERNIA REPAIR  1985   Left   HERNIA REPAIR  03/2004   Right   SHOULDER DEBRIDEMENT Left 2005   TONSILLECTOMY Bilateral childhood   TRANSURETHRAL RESECTION OF PROSTATE N/A 10/2003   VASECTOMY      No Known Allergies  Allergies as of 07/08/2021   No Known Allergies      Medication List        Accurate as of July 08, 2021 11:59 PM. If you have any questions, ask your  nurse or doctor.          amLODipine 5 MG tablet Commonly known as: NORVASC TAKE 1 TABLET DAILY   aspirin 325 MG tablet Take 325 mg by mouth every evening.   atorvastatin 40 MG tablet Commonly known as: LIPITOR TAKE 1 TABLET DAILY   buPROPion 75 MG tablet Commonly known as: WELLBUTRIN Take 75 mg by mouth 2 (two) times daily.   DORZOLAMIDE HCL-TIMOLOL MAL OP Apply 1 drop to eye 2 (two) times daily.   enalapril 20 MG tablet Commonly known as: VASOTEC Take 20 mg by mouth in the morning.   Fish Oil 1000 MG Caps Take 1 capsule by mouth 2 (two) times daily.   loratadine 10 MG tablet Commonly known as: CLARITIN Take 10 mg by mouth daily.   PreserVision AREDS 2 Caps Take 1 capsule by mouth 2 (two) times daily.        Review of Systems  Constitutional:  Negative for appetite change, fatigue and fever.  HENT:  Positive for hearing loss. Negative for congestion.   Eyes:  Negative for visual disturbance.  Respiratory:  Negative for cough and shortness of breath.   Cardiovascular:  Positive for leg swelling.  Gastrointestinal:  Negative for abdominal pain, constipation and vomiting.  Genitourinary:  Negative for dysuria and urgency.  Musculoskeletal:  Positive for arthralgias, back pain and gait problem.  Skin:  Negative for color change.  Neurological:  Negative for syncope, speech difficulty, weakness and headaches.       Memory lapses.   Psychiatric/Behavioral:  Positive for confusion and sleep disturbance. The patient is not nervous/anxious.    Immunization History  Administered Date(s) Administered   Influenza Whole 05/03/2002, 04/19/2012, 04/20/2013   Influenza, High Dose Seasonal PF 05/03/2019   Influenza,inj,Quad PF,6+ Mos 04/21/2018   Influenza-Unspecified 05/03/2014, 04/18/2015, 04/30/2016, 04/18/2017, 04/09/2020, 05/13/2021   Moderna Sars-Covid-2 Vaccination 07/24/2019, 08/21/2019, 06/03/2020, 04/09/2021   Pneumococcal Conjugate-13 03/20/2010    Pneumococcal Polysaccharide-23 11/02/2017   Td 02/24/2018   Tdap 06/27/2011   Zoster Recombinat (Shingrix) 12/24/2017, 03/09/2018   Zoster, Live 11/30/2006   Pertinent  Health Maintenance Due  Topic Date Due   INFLUENZA VACCINE  Completed   Fall Risk 08/07/2020 08/15/2020 09/04/2020 12/04/2020 04/21/2021  Falls in the past year? 0 1 0 0 -  Was there an injury with Fall? - 0 - - -  Fall Risk Category Calculator - 2 - - -  Fall Risk Category - Moderate - - -  Patient Fall Risk Level - Moderate fall risk - - Moderate fall risk   Functional Status Survey:    Vitals:   07/08/21 1636  BP: (!) 145/77  Pulse: 67  Resp: 20  Temp: (!) 97.5 F (36.4 C)  SpO2: 96%   There is no height or weight on file to calculate  BMI. Physical Exam Vitals and nursing note reviewed.  Constitutional:      Appearance: Normal appearance.  HENT:     Head: Normocephalic and atraumatic.     Nose: Nose normal.     Mouth/Throat:     Mouth: Mucous membranes are moist.  Eyes:     Extraocular Movements: Extraocular movements intact.     Conjunctiva/sclera: Conjunctivae normal.     Comments: Left pupil is bigger than the right   Cardiovascular:     Rate and Rhythm: Normal rate and regular rhythm.     Heart sounds: No murmur heard. Pulmonary:     Effort: Pulmonary effort is normal.     Breath sounds: No rales.  Abdominal:     General: Bowel sounds are normal.     Palpations: Abdomen is soft.     Tenderness: There is no abdominal tenderness.  Musculoskeletal:     Cervical back: Normal range of motion and neck supple.     Right lower leg: Edema present.     Left lower leg: Edema present.     Comments: Trace edema BLE  Skin:    General: Skin is warm and dry.  Neurological:     General: No focal deficit present.     Mental Status: He is alert. Mental status is at baseline.     Gait: Gait normal.     Comments: Oriented to person, place.   Psychiatric:        Mood and Affect: Mood normal.         Thought Content: Thought content normal.    Labs reviewed: Recent Labs    04/07/21 0000 04/10/21 0000 04/21/21 1119  NA 142 139 138  K 4.1 4.0 4.1  CL 107 106 109  CO2 21 25* 22  GLUCOSE  --   --  90  BUN 29* 25* 26*  CREATININE 0.9 1.0 0.93  CALCIUM 8.7 8.7 8.6*   Recent Labs    04/07/21 0000 04/10/21 0000  AST 26 24  ALT 30 28  ALKPHOS 59 57  ALBUMIN 4.0 4.0   Recent Labs    04/07/21 0000 04/10/21 0000 04/21/21 1119  WBC 7.9 8.8 8.1  NEUTROABS 4,701.00 5,562.00 5.9  HGB 14.8 14.9 14.5  HCT 44 43 42.6  MCV  --   --  91.4  PLT 197 219 192   Lab Results  Component Value Date   TSH 2.73 04/07/2021   No results found for: HGBA1C Lab Results  Component Value Date   CHOL 135 04/07/2021   HDL 48 04/07/2021   LDLCALC 73 04/07/2021   TRIG 60 04/07/2021   CHOLHDL 2.9 05/09/2020    Significant Diagnostic Results in last 30 days:  No results found.  Assessment/Plan Depression, recurrent ([place]) refused antidepressant in the past. Currently on Wellbutrin 120m qd since 06/19/21(started Wellbutrin 786mbid 05/29/21). Reported the patient gets up midnight, dressed, looks for breakfast. Melatonin has little efficacy, the patient stated he didn't want sleeping pill, staying up at night does not bother him, may bother staff. Continue supportive care.   Hyperlipidemia , takes Atorvastatin. LDL 73 04/07/21  Chronic lower back pain  takes Tylenol.   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   Rosacea  scaly appearance.   Essential hypertension Blood pressure is controlled, takes Amlodipine, Enalapril, Bun/creat 26/0.93 eGFR >60 04/21/21  TIA (transient ischemic attack) ASA, statin, f/u Neurology  Constipation on  Senokot II qhs   Family/ staff Communication: plan of care reviewed with the patient and charge nurse.   Labs/tests ordered: none  Time spend 40  minutes.

## 2021-07-08 NOTE — Assessment & Plan Note (Signed)
ASA, statin, f/u Neurology

## 2021-07-08 NOTE — Assessment & Plan Note (Signed)
,   takes Atorvastatin. LDL 73 04/07/21

## 2021-07-08 NOTE — Assessment & Plan Note (Signed)
refused antidepressant in the past. Currently on Wellbutrin 150mg  qd since 06/19/21(started Wellbutrin 75mg  bid 05/29/21). Reported the patient gets up midnight, dressed, looks for breakfast. Melatonin has little efficacy, the patient stated he didn't want sleeping pill, staying up at night does not bother him, may bother staff. Continue supportive care.

## 2021-07-08 NOTE — Assessment & Plan Note (Signed)
Blood pressure is controlled, takes Amlodipine, Enalapril, Bun/creat 26/0.93 eGFR >60 04/21/21

## 2021-07-10 ENCOUNTER — Encounter: Payer: Self-pay | Admitting: Nurse Practitioner

## 2021-08-19 ENCOUNTER — Encounter: Payer: Medicare Other | Admitting: Nurse Practitioner

## 2021-08-19 ENCOUNTER — Other Ambulatory Visit: Payer: Self-pay

## 2021-08-25 ENCOUNTER — Non-Acute Institutional Stay (INDEPENDENT_AMBULATORY_CARE_PROVIDER_SITE_OTHER): Payer: Medicare Other | Admitting: Orthopedic Surgery

## 2021-08-25 ENCOUNTER — Encounter: Payer: Self-pay | Admitting: Orthopedic Surgery

## 2021-08-25 DIAGNOSIS — Z0000 Encounter for general adult medical examination without abnormal findings: Secondary | ICD-10-CM

## 2021-08-25 NOTE — Progress Notes (Signed)
Location:  [place] Room Number: 29-A Place of Service:  [phone]) Provider: Windell Moulding, NP  Patient Care Team: Virgie Dad, MD as PCP - General (Internal Medicine) Melina Modena, [facility] Crista Luria, MD as Consulting Physician (Dermatology) Marilynne Halsted, MD as Referring Physician (Ophthalmology) Bond, Tracie Harrier, MD as Referring Physician (Ophthalmology) Virgie Dad, MD as Referring Physician (Internal Medicine)  Extended Emergency Contact Information Primary Emergency Contact: Osvaldo Human of [place] Phone: [phone] Relation: Son  Code Status: DNR Goals of Care: Advanced Directive information Advanced Directives 08/25/2021  Does Patient Have a Medical Advance Directive? Yes  Type of Paramedic of [place];Living will;Out of facility DNR (pink MOST or yellow form)  Does patient want to make changes to medical advance directive? No - Patient declined  Copy of [place] in Chart? Yes - validated most recent copy scanned in chart (See row information)  Would patient like information on creating a medical advance directive? -  Pre-existing out of facility DNR order (yellow form or pink MOST form) -     Chief Complaint  Patient presents with   Medicare Wellness    Annual Wellness Visit.    HPI: Patient is a 88 y.o. male seen in today for an annual wellness exam.    Depression screen [facility] 2/9 08/15/2020 06/29/2018 07/05/2017 06/25/2017 05/03/2017  Decreased Interest 0 0 0 0 0  Down, Depressed, Hopeless 0 0 0 0 0  PHQ - 2 Score 0 0 0 0 0  Altered sleeping - - - - 0  Tired, decreased energy - - - - -  Change in appetite - - - - 0  Feeling bad or failure about yourself  - - - - 0  Trouble concentrating - - - - 0  Moving slowly or fidgety/restless - - - - 0  Suicidal thoughts - - - - 0  PHQ-9 Score - - - - 0  Difficult doing work/chores - - - - -    Fall Risk 08/07/2020  08/15/2020 09/04/2020 12/04/2020 04/21/2021  Falls in the past year? 0 1 0 0 -  Was there an injury with Fall? - 0 - - -  Fall Risk Category Calculator - 2 - - -  Fall Risk Category - Moderate - - -  Patient Fall Risk Level - Moderate fall risk - - Moderate fall risk   MMSE - Mini Mental State Exam 09/13/2019 05/03/2018 05/03/2017 05/19/2016 10/30/2014 10/18/2012  Not completed: - - (No Data) - - -  Orientation to time 5 5 5 5 5 5   Orientation to Place 5 5 5 5 5 5   Registration 3 3 3 3 3 3   Attention/ Calculation 5 4 5 5 5 5   Recall 3 3 3 3 3 3   Language- name 2 objects 2 2 2 2 2 2   Language- repeat 1 1 1 1 1 1   Language- follow 3 step command 3 3 3 3 3 3   Language- read & follow direction 1 1 1 1 1 1   Write a sentence 1 1 1 1 1 1   Copy design 1 0 1 1 1 1   Total score 30 28 30 30 30 30      Health Maintenance  Topic Date Due   TETANUS/TDAP  02/25/2028   Pneumonia Vaccine 79+ Years old  Completed   INFLUENZA VACCINE  Completed   COVID-19 Vaccine  Completed   Zoster Vaccines- Shingrix  Completed  HPV VACCINES  Aged Out    Urinary incontinence? Functional Status Survey:   Exercise? Diet? No results found. 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/18/2012   Deafness 10/18/2012   Endothelial corneal dystrophy    Essential hypertension 10/18/2012   Hammer toe 04/24/2014   Hyperlipidemia 10/18/2012   Intermediate stage nonexudative age-related macular degeneration of both eyes 04/16/2016   Nocturia 05/19/2016   Occlusion and stenosis of carotid artery with cerebral infarction    Other specified cardiac dysrhythmias(427.89)    Pain in joint, pelvic region and thigh     Paresthesia 04/30/2015   Toes of both feet    Peripheral vascular disease, unspecified ([place])    Primary open angle glaucoma 09/25/2011   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   Rosacea    Seborrheic keratosis 04/24/2014   Syncope 06/27/2013   TIA (transient ischemic attack) 10/18/2012   States he has had about 5  times   Urine frequency     Past Surgical History:  Procedure Laterality Date   CATARACT EXTRACTION  2005   bilateral   CORNEAL TRANSPLANT  2010 left; 2013 right eye   [place]   Left   HERNIA REPAIR  03/2004   Right   SHOULDER DEBRIDEMENT Left 2005   TONSILLECTOMY Bilateral childhood   TRANSURETHRAL RESECTION OF PROSTATE N/A 10/2003   VASECTOMY      Family History  Problem Relation Age of Onset   Cancer Mother    Heart disease Father     Social History   Socioeconomic History   Marital status: Widowed    Spouse name: Not on file   Number of children: Not on file   Years of education: Not on file   Highest education level: Not on file  Occupational History   Occupation: retired Insurance claims handler  Tobacco Use   Smoking status: Former    Packs/day: 1.00    Years: 25.00    Pack years: 25.00    Types: Cigarettes    Quit date: 10/18/1964    Years since quitting: 56.8   Smokeless tobacco: Never   Tobacco comments:    quit 1966  Vaping Use   Vaping Use: Never used  Substance and Sexual Activity   Alcohol use: No    Alcohol/week: 1.0 standard drink    Types: 1 Glasses of wine per week    Comment: very seldom   Drug use: No   Sexual activity: Not on file  Other Topics Concern   Not on file  Social History Narrative   L[facility] at [facility] since 08/21/[facility]   Married to [place] Will   Exercise: 3 x week bicycle   Previously smoked stopped 1966   Does not drink cafferinated beverage   Drinks one small glass of wine few nights a week    Social Determinants of Radio broadcast assistant Strain: Not on file  Food Insecurity: Not on file   Transportation Needs: Not on file  Physical Activity: Not on file  Stress: Not on file  Social Connections: Not on file    reports that he quit smoking about 56 years ago. His smoking use  included cigarettes. He has a 25.00 pack-year smoking history. He has never used smokeless tobacco. He reports that he does not drink alcohol and does not use drugs.   No Known Allergies  Outpatient Encounter Medications as of 08/25/2021  Medication Sig   amLODipine (NORVASC) 5 MG tablet TAKE 1 TABLET DAILY   aspirin 325 MG tablet Take 325 mg by mouth every evening.   atorvastatin (LIPITOR) 40 MG tablet TAKE 1 TABLET DAILY   buPROPion (WELLBUTRIN XL) 150 MG 24 hr tablet Take 150 mg by mouth every morning.   DORZOLAMIDE HCL-TIMOLOL MAL OP Place 1 drop into both eyes 2 (two) times daily.   enalapril (VASOTEC) 20 MG tablet Take 20 mg by mouth in the morning.   latanoprost (XALATAN) 0.005 % ophthalmic solution Place 1 drop into the left eye daily.   loratadine (CLARITIN) 10 MG tablet Take 10 mg by mouth daily.   melatonin 3 MG TABS tablet Take 3 mg by mouth at bedtime. 8pm   Multiple Vitamins-Minerals (PRESERVISION AREDS 2) CAPS Take 1 capsule by mouth daily.   senna-docusate (SENOKOT-S) 8.6-50 MG tablet Take 1 tablet by mouth daily.   [DISCONTINUED] buPROPion (WELLBUTRIN) 75 MG tablet Take 75 mg by mouth 2 (two) times daily.   [DISCONTINUED] Omega-3 Fatty Acids (FISH OIL) 1000 MG CAPS Take 1 capsule by mouth 2 (two) times daily.   No facility-administered encounter medications on file as of 08/25/2021.     Review of Systems:  Review of Systems  Physical Exam: Vitals:   08/25/21 0940  BP: (!) 156/83  Pulse: 63  Resp: 18  Temp: (!) 96.4 F (35.8 C)  SpO2: 96%  Weight: 165 lb 12.8 oz (75.2 kg)  Height: 5\' 7"  (1.702 m)   Body mass index is 25.97 kg/m. Physical Exam  Labs reviewed: Basic Metabolic Panel: Recent Labs    04/07/21 0000 04/10/21 0000 04/21/21 1119  NA 142 139 138  K 4.1  4.0 4.1  CL 107 106 109  CO2 21 25* 22  GLUCOSE  --   --  90  BUN 29* 25* 26*  CREATININE 0.9 1.0 0.93  CALCIUM 8.7 8.7 8.6*  TSH 2.73  --   --    Liver Function Tests: Recent Labs    04/07/21 0000 04/10/21 0000  AST 26 24  ALT 30 28  ALKPHOS 59 57  ALBUMIN 4.0 4.0   No results for input(s): LIPASE, AMYLASE in the last 8760 hours. No results for input(s): AMMONIA in the last 8760 hours. CBC: Recent Labs    04/07/21 0000 04/10/21 0000 04/21/21 1119  WBC 7.9 8.8 8.1  NEUTROABS 4,701.00 5,562.00 5.9  HGB 14.8 14.9 14.5  HCT 44 43 42.6  MCV  --   --  91.4  PLT 197 219 192   Lipid Panel: Recent Labs    04/07/21 0000  CHOL 135  HDL 48  LDLCALC 73  TRIG 60   No results found for: HGBA1C  Procedures: No results found.  Assessment/Plan There are no diagnoses linked to this encounter.   Labs/tests ordered:  * No order type specified * Next appt:  @NEXTENCTHIS  DEPT@

## 2021-08-25 NOTE — Patient Instructions (Signed)
°  [name] , Thank you for taking time to come for your Medicare Wellness Visit. I appreciate your ongoing commitment to your health goals. Please review the following plan we discussed and let me know if I can assist you in the future.   These are the goals we discussed:  Goals      Maintain Mobility and Function     Evidence-based guidance:  Acknowledge and validate impact of pain, loss of strength and potential disfigurement (hand osteoarthritis) on mental health and daily life, such as social isolation, anxiety, depression, impaired sexual relationship and   injury from falls.  Anticipate referral to physical or occupational therapy for assessment, therapeutic exercise and recommendation for adaptive equipment or assistive devices; encourage participation.  Assess impact on ability to perform activities of daily living, as well as engage in sports and leisure events or requirements of work or school.  Provide anticipatory guidance and reassurance about the benefit of exercise to maintain function; acknowledge and normalize fear that exercise may worsen symptoms.  Encourage regular exercise, at least 10 minutes at a time for 45 minutes per week; consider yoga, water exercise and proprioceptive exercises; encourage use of wearable activity tracker to increase motivation and adherence.  Encourage maintenance or resumption of daily activities, including employment, as pain allows and with minimal exposure to trauma.  Assist patient to advocate for adaptations to the work environment.  Consider level of pain and function, gender, age, lifestyle, patient preference, quality of life, readiness and capacity to benefit when recommending patients for orthopaedic surgery consultation.  Explore strategies, such as changes to medication regimen or activity that enables patient to anticipate and manage flare-ups that increase deconditioning and disability.  Explore patient preferences; encourage  exposure to a broader range of activities that have been avoided for fear of experiencing pain.  Identify barriers to participation in therapy or exercise, such as pain with activity, anticipated or imagined pain.  Monitor postoperative joint replacement or any preexisting joint replacement for ongoing pain and loss of function; provide social support and encouragement throughout recovery.   Notes:      Patient Stated     To get back into routine exercise         This is a list of the screening recommended for you and due dates:  Health Maintenance  Topic Date Due   Tetanus Vaccine  02/25/2028   Pneumonia Vaccine  Completed   Flu Shot  Completed   COVID-19 Vaccine  Completed   Zoster (Shingles) Vaccine  Completed   HPV Vaccine  Aged Out

## 2021-08-25 NOTE — Progress Notes (Signed)
Subjective:   Russell [name] is a 88 y.o. male who presents for Medicare Annual/Subsequent preventive examination.  Review of Systems           Objective:    Today's Vitals   08/25/21 0940  BP: (!) 156/83  Pulse: 63  Resp: 18  Temp: (!) 96.4 F (35.8 C)  SpO2: 96%  Weight: 165 lb 12.8 oz (75.2 kg)  Height: 5\' 7"  (1.702 m)   Body mass index is 25.97 kg/m.  Advanced Directives 08/25/2021 05/30/2021 04/22/2021 04/21/2021 04/03/2021 08/15/2020 11/30/2018  Does Patient Have a Medical Advance Directive? Yes Yes Yes Yes Yes Yes Yes  Type of Paramedic of [place];Living will;Out of facility DNR (pink MOST or yellow form) Out of facility DNR (pink MOST or yellow form);Living will;Healthcare Power of [place];Living will;Out of facility DNR (pink MOST or yellow form) Out of facility DNR (pink MOST or yellow form) Out of facility DNR (pink MOST or yellow form);Living will;Healthcare Power of [place];Living will;Out of facility DNR (pink MOST or yellow form) [place];Out of facility DNR (pink MOST or yellow form)  Does patient want to make changes to medical advance directive? No - Patient declined No - Patient declined No - Patient declined No - Patient declined No - Patient declined No - Patient declined No - Patient declined  Copy of [place] in Chart? Yes - validated most recent copy scanned in chart (See row information) Yes - validated most recent copy scanned in chart (See row information) Yes - validated most recent copy scanned in chart (See row information) - Yes - validated most recent copy scanned in chart (See row information) Yes - validated most recent copy scanned in chart (See row information) Yes - validated most recent copy scanned in chart (See row information)  Would patient like information on creating a medical advance directive? - - - - - - -  Pre-existing  out of facility DNR order (yellow form or pink MOST form) - Yellow form placed in chart (order not valid for inpatient use) - - Yellow form placed in chart (order not valid for inpatient use) - Yellow form placed in chart (order not valid for inpatient use)    Current Medications (verified) Outpatient Encounter Medications as of 08/25/2021  Medication Sig   amLODipine (NORVASC) 5 MG tablet TAKE 1 TABLET DAILY   aspirin 325 MG tablet Take 325 mg by mouth every evening.   atorvastatin (LIPITOR) 40 MG tablet TAKE 1 TABLET DAILY   buPROPion (WELLBUTRIN XL) 150 MG 24 hr tablet Take 150 mg by mouth every morning.   DORZOLAMIDE HCL-TIMOLOL MAL OP Place 1 drop into both eyes 2 (two) times daily.   enalapril (VASOTEC) 20 MG tablet Take 20 mg by mouth in the morning.   latanoprost (XALATAN) 0.005 % ophthalmic solution Place 1 drop into the left eye daily.   loratadine (CLARITIN) 10 MG tablet Take 10 mg by mouth daily.   melatonin 3 MG TABS tablet Take 3 mg by mouth at bedtime. 8pm   Multiple Vitamins-Minerals (PRESERVISION AREDS 2) CAPS Take 1 capsule by mouth daily.   senna-docusate (SENOKOT-S) 8.6-50 MG tablet Take 1 tablet by mouth daily.   [DISCONTINUED] buPROPion (WELLBUTRIN) 75 MG tablet Take 75 mg by mouth 2 (two) times daily.   [DISCONTINUED] Omega-3 Fatty Acids (FISH OIL) 1000 MG CAPS Take 1 capsule by mouth 2 (two) times daily.   No facility-administered  encounter medications on file as of 08/25/2021.    Allergies (verified) Patient has no known allergies.   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/18/2012   Deafness 10/18/2012   Endothelial corneal dystrophy    Essential hypertension 10/18/2012   Hammer  toe 04/24/2014   Hyperlipidemia 10/18/2012   Intermediate stage nonexudative age-related macular degeneration of both eyes 04/16/2016   Nocturia 05/19/2016   Occlusion and stenosis of carotid artery with cerebral infarction    Other specified cardiac dysrhythmias(427.89)    Pain in joint, pelvic region and thigh    Paresthesia 04/30/2015   Toes of both feet    Peripheral vascular disease, unspecified ([place])    Primary open angle glaucoma 09/25/2011   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   Rosacea    Seborrheic keratosis 04/24/2014   Syncope 06/27/2013   TIA (transient ischemic attack) 10/18/2012   States he has had about 5  times   Urine frequency    Past Surgical History:  Procedure Laterality Date   CATARACT EXTRACTION  2005   bilateral   CORNEAL TRANSPLANT  2010 left; 2013 right eye   [place]   Left   HERNIA REPAIR  03/2004   Right   SHOULDER DEBRIDEMENT Left 2005   TONSILLECTOMY Bilateral childhood   TRANSURETHRAL RESECTION OF PROSTATE N/A 10/2003   VASECTOMY     Family History  Problem Relation Age of Onset   Cancer Mother    Heart disease Father    Social History   Socioeconomic History   Marital status: Widowed    Spouse name: Not on file   Number of children: Not on file   Years of education: Not on file   Highest education level: Not on file  Occupational History   Occupation: retired Insurance claims handler  Tobacco Use   Smoking status: Former    Packs/day: 1.00    Years: 25.00    Pack years: 25.00    Types: Cigarettes    Quit date: 10/18/1964    Years since quitting: 56.8   Smokeless tobacco: Never   Tobacco comments:    quit 1966  Vaping Use   Vaping Use: Never used  Substance and Sexual Activity   Alcohol use: No    Alcohol/week: 1.0 standard drink    Types: 1 Glasses of wine per week    Comment: very seldom   Drug use: No   Sexual activity: Not on file  Other Topics Concern   Not on file  Social History Narrative   [facility] since  08/21/2011   Married to [place] Will   Exercise: 3 x week bicycle   Previously smoked stopped 1966   Does not drink cafferinated beverage   Drinks one small glass of wine few nights a week    Social Determinants of Radio broadcast assistant Strain: Not on file  Food Insecurity: Not on file  Transportation Needs: Not on file  Physical Activity: Not on file  Stress: Not on file  Social Connections: Not on file    Tobacco Counseling Counseling given: Not Answered Tobacco comments: quit 1966   Clinical Intake:  Diabetic: No     Activities of Daily Living No flowsheet data found.  Patient Care Team: Virgie Dad, MD as PCP - General (Internal Medicine) [place], [facility] Crista Luria, MD as Consulting Physician (Dermatology) Marilynne Halsted, MD as Referring Physician (Ophthalmology) Bond, Tracie Harrier, MD as Referring Physician (Ophthalmology) Virgie Dad, MD as Referring Physician (Internal Medicine)  Indicate any recent Medical Services you may have received from other than [facility] providers in the past year (date may be approximate).     Assessment:   This is a routine wellness examination for [place].  Hearing/Vision screen No results found.  Dietary issues and exercise activities discussed:     Goals Addressed   None    Depression Screen PHQ 2/9 Scores 08/15/2020 06/29/2018 07/05/2017 06/25/2017 05/03/2017 04/05/2017 01/25/2017  PHQ - 2 Score 0 0 0 0 0 5 0  PHQ- 9 Score - - - - 0 9 -    Fall Risk Fall Risk  12/04/2020 09/04/2020 08/15/2020 08/07/2020 05/15/2020  Falls in the past year? 0 0 1 0 0  Number falls in past yr: 0 0 1 0 0  Injury with Fall? - - 0 - -    FALL RISK PREVENTION PERTAINING TO THE HOME:  Any stairs in or around the home? No  If so, are there any without handrails? No  Home free of loose throw rugs in walkways, pet beds, electrical cords, etc? Yes  Adequate lighting in your home to reduce risk of falls?  Yes   ASSISTIVE DEVICES UTILIZED TO PREVENT FALLS:  Life alert? No  Use of a cane, walker or w/c? Yes  Grab bars in the bathroom? Yes  Shower chair or bench in shower? Yes  Elevated toilet seat or a handicapped toilet? Yes   TIMED UP AND GO:  Was the test performed? No .  Length of time to ambulate 10 feet: N/A sec.   Gait slow and steady with assistive device  Cognitive Function: MMSE - Mini Mental State Exam 09/13/2019 05/03/2018 05/03/2017 05/19/2016 10/30/2014  Not completed: - - (No Data) - -  Orientation to time 5 5 5 5 5   Orientation to Place 5 5 5 5 5   Registration 3 3 3 3 3   Attention/ Calculation 5 4 5 5 5   Recall 3 3 3 3 3   Language- name 2 objects 2 2 2 2 2   Language- repeat 1 1 1 1 1   Language- follow 3 step command 3 3 3 3 3   Language- read & follow direction 1 1 1 1 1   Write a sentence 1 1 1 1 1   Copy design 1 0 1 1 1   Total score 30 28 30 30 30      6CIT Screen 08/15/2020  What Year? 0 points  What month? 0 points  What time? 0 points  Count back from 20 0 points  Months in reverse 4 points  Repeat phrase 0 points  Total Score 4    Immunizations Immunization History  Administered Date(s) Administered   Influenza Whole 05/03/2002, 04/19/2012, 04/20/2013   Influenza, High Dose Seasonal PF 05/03/2019   Influenza,inj,Quad PF,6+ Mos 04/21/2018   Influenza-Unspecified 05/03/2014, 04/18/2015, 04/30/2016, 04/18/2017, 04/09/2020, 05/13/2021   Moderna SARS-COV2 Booster Vaccination 12/25/2020   Moderna Sars-Covid-2 Vaccination 07/24/2019, 08/21/2019, 06/03/2020   Pfizer Covid-19 Vaccine Bivalent Booster 61yrs & up 04/09/2021   Pneumococcal Conjugate-13 03/20/2010   Pneumococcal Polysaccharide-23 11/02/2017   Td 02/24/2018   Tdap 06/27/2011   Zoster Recombinat (Shingrix) 12/24/2017, 03/09/2018  Zoster, Live 11/30/2006    TDAP status: Up to date  Flu Vaccine status: Up to date  Pneumococcal vaccine status: Up to date  Covid-19 vaccine status:  Completed vaccines  Qualifies for Shingles Vaccine? Yes   Zostavax completed Yes   Shingrix Completed?: Yes  Screening Tests Health Maintenance  Topic Date Due   TETANUS/TDAP  02/25/2028   Pneumonia Vaccine 20+ Years old  Completed   INFLUENZA VACCINE  Completed   COVID-19 Vaccine  Completed   Zoster Vaccines- Shingrix  Completed   HPV VACCINES  Aged Out    Health Maintenance  There are no preventive care reminders to display for this patient.  Colorectal cancer screening: No longer required.   Lung Cancer Screening: (Low Dose CT Chest recommended if Age 51-80 years, 30 pack-year currently smoking OR have quit w/in 15years.) does not qualify.   Lung Cancer Screening Referral: No  Additional Screening:  Hepatitis C Screening: does not qualify; advanced age  Vision Screening: Recommended annual ophthalmology exams for early detection of glaucoma and other disorders of the eye. Is the patient up to date with their annual eye exam?  Yes  Who is the provider or what is the name of the office in which the patient attends annual eye exams? Dr. Andria Frames If pt is not established with a provider, would they like to be referred to a provider to establish care? No .   Dental Screening: Recommended annual dental exams for proper oral hygiene  Community Resource Referral / Chronic Care Management: CRR required this visit?  No   CCM required this visit?  No      Plan:     I have personally reviewed and noted the following in the patients chart:   Medical and social history Use of alcohol, tobacco or illicit drugs  Current medications and supplements including opioid prescriptions. Patient is not currently taking opioid prescriptions. Functional ability and status Nutritional status Physical activity Advanced directives List of other physicians Hospitalizations, surgeries, and ER visits in previous 12 months Vitals Screenings to include cognitive, depression, and  falls Referrals and appointments  In addition, I have reviewed and discussed with patient certain preventive protocols, quality metrics, and best practice recommendations. A written personalized care plan for preventive services as well as general preventive health recommendations were provided to patient.     Yvonna Alanis, NP   08/25/2021

## 2021-10-02 ENCOUNTER — Encounter: Payer: Self-pay | Admitting: Internal Medicine

## 2021-10-02 ENCOUNTER — Non-Acute Institutional Stay: Payer: Medicare Other | Admitting: Internal Medicine

## 2021-10-02 DIAGNOSIS — I69919 Unspecified symptoms and signs involving cognitive functions following unspecified cerebrovascular disease: Secondary | ICD-10-CM

## 2021-10-02 DIAGNOSIS — G459 Transient cerebral ischemic attack, unspecified: Secondary | ICD-10-CM | POA: Diagnosis not present

## 2021-10-02 DIAGNOSIS — I10 Essential (primary) hypertension: Secondary | ICD-10-CM

## 2021-10-02 DIAGNOSIS — F339 Major depressive disorder, recurrent, unspecified: Secondary | ICD-10-CM

## 2021-10-02 DIAGNOSIS — E785 Hyperlipidemia, unspecified: Secondary | ICD-10-CM

## 2021-10-02 NOTE — Progress Notes (Signed)
?Location:   [place] Room Number: 29 ?Place of Service:  ALF (13) ?Provider:  Veleta Miners MD  ? ?Russell Dad, MD ? ?Patient Care Team: ?Russell Dad, MD as PCP - General (Internal Medicine) ?[place] ?Crista Luria, MD as Consulting Physician (Dermatology) ?Marilynne Halsted, MD as Referring Physician (Ophthalmology) ?Rondel Oh, MD as Referring Physician (Ophthalmology) ?Russell Dad, MD as Referring Physician (Internal Medicine) ? ?Extended Emergency Contact Information ?Primary Emergency Contact: Moster,Steve ? [place] ?Home Phone: [phone] ?Relation: Son ? ?Code Status:  DNR ?Goals of care: Advanced Directive information ?Advanced Directives 10/02/2021  ?Does Patient Have a Medical Advance Directive? Yes  ?Type of Paramedic of [place];Living will;Out of facility DNR (pink MOST or yellow form)  ?Does patient want to make changes to medical advance directive? No - Patient declined  ?Copy of [place] in Chart? Yes - validated most recent copy scanned in chart (See row information)  ?Would patient like information on creating a medical advance directive? -  ?Pre-existing out of facility DNR order (yellow form or pink MOST form) -  ? ? ? ?Chief Complaint  ?Patient presents with  ? Medical Management of Chronic Issues  ? ? ?HPI:  ?Pt is a 88 y.o. male seen today for medical management of chronic diseases.   ? ?Patient has h/o Hypertension and Depression, TIA, Skin cancer ?Also has h/o TIA versus migraine ?Had detailed work-up by neurology for his atypical symptoms of visual impairment. ?With Aphasia and Word finding.He has had detail work ups including EEG, Echo and MRI .which have been Negative. ?They have recommended Asprin, BP control and Statin. ? ?Now in AL ?Mood much better today was smiling and interacting ?But continues to be confused. Not able to keep up with his words.Was repeating  himself This is new for him ?But doing his ADLS.  ?Walking with his walker. No falls ?Wt Readings from Last 3 Encounters:  ?10/02/21 167 lb 9.6 oz (76 kg)  ?08/25/21 165 lb 12.8 oz (75.2 kg)  ?05/30/21 163 lb 12.8 oz (74.3 kg)  ?No Acute complain No Nursing issues ? ? ?Past Medical History:  ?Diagnosis Date  ? Actinic keratosis 10/18/2012  ? Anisocoria 10/30/2014  ? Left pupil is larger than the right postsurgery corneal transplant.   ? Atrophy, Fuchs' 12/10/2011  ? Central retinal edema, cystoid 11/18/2012  ? Cervicalgia 04/25/2013  ? 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 Cyclitis, chronic 11/18/2012  ? Deafness 10/18/2012  ? Endothelial corneal dystrophy   ? Essential hypertension 10/18/2012  ? Hammer toe 04/24/2014  ? Hyperlipidemia 10/18/2012  ? Intermediate stage nonexudative age-related macular degeneration of both eyes 04/16/2016  ? Nocturia 05/19/2016  ? Occlusion and stenosis of carotid artery with cerebral infarction   ? Other specified cardiac dysrhythmias(427.89)   ? Pain in joint, pelvic region and thigh   ? Paresthesia 04/30/2015  ? Toes of both feet   ? Peripheral vascular disease, unspecified ([place])   ? Primary open angle glaucoma 09/25/2011  ? 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  ? Rosacea   ? Seborrheic keratosis 04/24/2014  ? Syncope 06/27/2013  ? TIA (transient ischemic attack) 10/18/2012  ? States he has had about 5  times  ? Urine frequency   ? ?Past Surgical History:  ?Procedure Laterality Date  ? CATARACT EXTRACTION  2005  ? bilateral  ? CORNEAL TRANSPLANT  2010 left; 2013 right eye  ? HERNIA REPAIR  1985  ?  Left  ? HERNIA REPAIR  03/2004  ? Right  ? SHOULDER DEBRIDEMENT Left 2005  ? TONSILLECTOMY Bilateral childhood  ? TRANSURETHRAL RESECTION OF PROSTATE N/A 10/2003  ? VASECTOMY    ? ? ?No Known Allergies ? ?Allergies as of 10/02/2021   ?No Known Allergies ?  ? ?  ?Medication List  ?  ? ?  ? Accurate as of October 02, 2021  2:46 PM. If you have any questions, ask your nurse or doctor.  ?  ?  ? ?  ? ?amLODipine 5 MG  tablet ?Commonly known as: NORVASC ?TAKE 1 TABLET DAILY ?  ?ammonium lactate 12 % lotion ?Commonly known as: LAC-HYDRIN ?Apply 1 application. topically daily. ?  ?aspirin 325 MG tablet ?Take 325 mg by mouth every evening. ?  ?atorvastatin 40 MG tablet ?Commonly known as: LIPITOR ?TAKE 1 TABLET DAILY ?  ?buPROPion 150 MG 24 hr tablet ?Commonly known as: WELLBUTRIN XL ?Take 150 mg by mouth every morning. ?  ?DORZOLAMIDE HCL-TIMOLOL MAL OP ?Place 1 drop into both eyes 2 (two) times daily. ?  ?enalapril 20 MG tablet ?Commonly known as: VASOTEC ?Take 20 mg by mouth in the morning. ?  ?latanoprost 0.005 % ophthalmic solution ?Commonly known as: XALATAN ?Place 1 drop into the left eye daily. ?  ?loratadine 10 MG tablet ?Commonly known as: CLARITIN ?Take 10 mg by mouth daily. ?  ?melatonin 3 MG Tabs tablet ?Take 3 mg by mouth at bedtime. 8pm ?  ?PreserVision AREDS 2 Caps ?Take 1 capsule by mouth daily. ?  ?senna-docusate 8.6-50 MG tablet ?Commonly known as: Senokot-S ?Take 1 tablet by mouth daily. ?  ? ?  ? ? ?Review of Systems  ?Constitutional:  Negative for activity change, appetite change and unexpected weight change.  ?HENT: Negative.    ?Respiratory:  Negative for cough and shortness of breath.   ?Cardiovascular:  Negative for leg swelling.  ?Gastrointestinal:  Negative for constipation.  ?Genitourinary:  Negative for frequency.  ?Musculoskeletal:  Negative for arthralgias, gait problem and myalgias.  ?Skin: Negative.  Negative for rash.  ?Neurological:  Negative for dizziness and weakness.  ?Psychiatric/Behavioral:  Positive for confusion. Negative for sleep disturbance.   ?All other systems reviewed and are negative. ? ?Immunization History  ?Administered Date(s) Administered  ? Influenza Whole 05/03/2002, 04/19/2012, 04/20/2013  ? Influenza, High Dose Seasonal PF 05/03/2019  ? Influenza,inj,Quad PF,6+ Mos 04/21/2018  ? Influenza-Unspecified 05/03/2014, 04/18/2015, 04/30/2016, 04/18/2017, 04/09/2020, 05/13/2021  ?  Moderna SARS-COV2 Booster Vaccination 12/25/2020  ? Moderna Sars-Covid-2 Vaccination 07/24/2019, 08/21/2019, 06/03/2020  ? Pension scheme manager 20yr & up 04/09/2021  ? Pneumococcal Conjugate-13 03/20/2010  ? Pneumococcal Polysaccharide-23 11/02/2017  ? Td 02/24/2018  ? Tdap 06/27/2011  ? Zoster Recombinat (Shingrix) 12/24/2017, 03/09/2018  ? Zoster, Live 11/30/2006  ? ?Pertinent  Health Maintenance Due  ?Topic Date Due  ? INFLUENZA VACCINE  Completed  ? ?Fall Risk 08/15/2020 09/04/2020 12/04/2020 04/21/2021 08/25/2021  ?Falls in the past year? 1 0 0 - 1  ?Was there an injury with Fall? 0 - - - 0  ?Fall Risk Category Calculator 2 - - - 1  ?Fall Risk Category Moderate - - - Low  ?Patient Fall Risk Level Moderate fall risk - - Moderate fall risk Moderate fall risk  ?Patient at Risk for Falls Due to - - - - History of fall(s);Impaired balance/gait;Impaired mobility  ?Fall risk Follow up - - - - Falls evaluation completed;Education provided  ? ?Functional Status Survey: ?  ? ?Vitals:  ?  10/02/21 1416  ?BP: (!) 155/93  ?Pulse: 84  ?Resp: 20  ?Temp: (!) 97.5 ?F (36.4 ?C)  ?SpO2: 95%  ?Weight: 167 lb 9.6 oz (76 kg)  ?Height: '5\' 7"'$  (1.702 m)  ? ?Body mass index is 26.25 kg/m?Marland Kitchen ?Physical Exam ?Vitals reviewed.  ?Constitutional:   ?   Appearance: Normal appearance.  ?HENT:  ?   Head: Normocephalic.  ?   Mouth/Throat:  ?   Mouth: Mucous membranes are moist.  ?   Pharynx: Oropharynx is clear.  ?Eyes:  ?   Pupils: Pupils are equal, round, and reactive to light.  ?Cardiovascular:  ?   Rate and Rhythm: Normal rate and regular rhythm.  ?   Pulses: Normal pulses.  ?   Heart sounds: No murmur heard. ?Pulmonary:  ?   Effort: Pulmonary effort is normal. No respiratory distress.  ?   Breath sounds: Normal breath sounds. No rales.  ?Abdominal:  ?   General: Abdomen is flat. Bowel sounds are normal.  ?   Palpations: Abdomen is soft.  ?Musculoskeletal:     ?   General: No swelling.  ?   Cervical back: Neck supple.  ?Skin: ?    General: Skin is warm.  ?Neurological:  ?   General: No focal deficit present.  ?   Mental Status: He is alert.  ?   Comments: Seems Confused struggling with his words  ?Psychiatric:     ?   Mood and Affect: Mood n

## 2021-10-06 LAB — COMPREHENSIVE METABOLIC PANEL
Albumin: 3.5 (ref 3.5–5.0)
Calcium: 8.5 — AB (ref 8.7–10.7)
Globulin: 1.8
eGFR: 60

## 2021-10-06 LAB — CBC AND DIFFERENTIAL
HCT: 43 (ref 41–53)
Hemoglobin: 14.6 (ref 13.5–17.5)
Neutrophils Absolute: 3522
Platelets: 182 10*3/uL (ref 150–400)
WBC: 6.3

## 2021-10-06 LAB — BASIC METABOLIC PANEL
BUN: 28 — AB (ref 4–21)
CO2: 29 — AB (ref 13–22)
Chloride: 107 (ref 99–108)
Creatinine: 1.2 (ref 0.6–1.3)
Glucose: 99
Potassium: 4 mEq/L (ref 3.5–5.1)
Sodium: 141 (ref 137–147)

## 2021-10-06 LAB — LIPID PANEL
Cholesterol: 126 (ref 0–200)
HDL: 41 (ref 35–70)
LDL Cholesterol: 73
Triglycerides: 50 (ref 40–160)

## 2021-10-06 LAB — HEPATIC FUNCTION PANEL
ALT: 19 U/L (ref 10–40)
AST: 17 (ref 14–40)
Alkaline Phosphatase: 55 (ref 25–125)
Bilirubin, Total: 0.6

## 2021-10-06 LAB — CBC: RBC: 4.81 (ref 3.87–5.11)

## 2021-10-21 ENCOUNTER — Non-Acute Institutional Stay: Payer: Medicare Other | Admitting: Nurse Practitioner

## 2021-10-21 ENCOUNTER — Encounter: Payer: Self-pay | Admitting: Nurse Practitioner

## 2021-10-21 DIAGNOSIS — F339 Major depressive disorder, recurrent, unspecified: Secondary | ICD-10-CM

## 2021-10-21 DIAGNOSIS — I10 Essential (primary) hypertension: Secondary | ICD-10-CM | POA: Diagnosis not present

## 2021-10-21 DIAGNOSIS — E785 Hyperlipidemia, unspecified: Secondary | ICD-10-CM

## 2021-10-21 DIAGNOSIS — K5904 Chronic idiopathic constipation: Secondary | ICD-10-CM

## 2021-10-21 DIAGNOSIS — I69919 Unspecified symptoms and signs involving cognitive functions following unspecified cerebrovascular disease: Secondary | ICD-10-CM

## 2021-10-21 NOTE — Assessment & Plan Note (Signed)
takes Atorvastatin. LDL 73 10/06/21 ?

## 2021-10-21 NOTE — Assessment & Plan Note (Signed)
Blood pressure is controlled, takes Amlodipine, Enalapril, Bun/creat 28/1.2 10/06/21 ?

## 2021-10-21 NOTE — Progress Notes (Signed)
?Location:  [place] Room Number: AL29/A ?Place of Service:  ALF (13) ?Provider: Emrys Mckamie X, NP ? ?Patient Care Team: ?Virgie Dad, MD as PCP - General (Internal Medicine) ?[place] ?Crista Luria, MD as Consulting Physician (Dermatology) ?Marilynne Halsted, MD as Referring Physician (Ophthalmology) ?Rondel Oh, MD as Referring Physician (Ophthalmology) ?Virgie Dad, MD as Referring Physician (Internal Medicine) ? ?Extended Emergency Contact Information ?Primary Emergency Contact: Klimaszewski,Steve ? [place] ?Home Phone: [phone] ?Relation: Son ? ?Code Status:  DNR ?Goals of care: Advanced Directive information ? ?  10/21/2021  ? 10:37 AM  ?Advanced Directives  ?Does Patient Have a Medical Advance Directive? Yes  ?Type of Paramedic of [place];Living will;Out of facility DNR (pink MOST or yellow form)  ?Does patient want to make changes to medical advance directive? No - Patient declined  ?Copy of [place] in Chart? Yes - validated most recent copy scanned in chart (See row information)  ? ? ? ?Chief Complaint  ?Patient presents with  ? Acute Visit  ?  Hyper-Focusing  ? ? ?HPI:  ?Pt is a 88 y.o. male seen today for an acute visit for the patient's son HPOA requested med review, c/o the patient's escalated worries,, hyper-focusing behaviors, the patient calls his son agitated and tells him something needs to be done but cannot verbalized what in particular. The patient denied pain or discomfort, unable to give reliable HPI.  ? HTN, takes Amlodipine, Enalapril, Bun/creat 28/1.2 10/06/21 ?            Depression, refused antidepressant in the past. Currently on Wellbutrin '150mg'$  qd since 06/19/21(started Wellbutrin '75mg'$  bid 05/29/21). Reported the patient gets up midnight, dressed, looks for breakfast,  the patient stated he didn't want sleeping pill, staying up at night does not bother him, may bother staff.  TSH 2.73 04/07/21 ?            Hyperlipidemia, takes Atorvastatin. LDL 73 10/06/21 ?            Lower back pain, takes ASA '325mg'$  qhs ?            Memory deficit, MMSE 27/30 04/04/21, MRI 2019 chronic small vessel ischemic disease.  ?            Rosacea, scaly appearance.  ?            Constipation, on Senokot S Iqhs ?  ? ? ?Past Medical History:  ?Diagnosis Date  ? Actinic keratosis 10/18/2012  ? Anisocoria 10/30/2014  ? Left pupil is larger than the right postsurgery corneal transplant.   ? Atrophy, Fuchs' 12/10/2011  ? Central retinal edema, cystoid 11/18/2012  ? Cervicalgia 04/25/2013  ? 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 Cyclitis, chronic 11/18/2012  ? Deafness 10/18/2012  ? Endothelial corneal dystrophy   ? Essential hypertension 10/18/2012  ? Hammer toe 04/24/2014  ? Hyperlipidemia 10/18/2012  ? Intermediate stage nonexudative age-related macular degeneration of both eyes 04/16/2016  ? Nocturia 05/19/2016  ? Occlusion and stenosis of carotid artery with cerebral infarction   ? Other specified cardiac dysrhythmias(427.89)   ? Pain in joint, pelvic region and thigh   ? Paresthesia 04/30/2015  ? Toes of both feet   ? Peripheral vascular disease, unspecified ([place])   ? Primary open angle glaucoma 09/25/2011  ? 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  ? Rosacea   ? Seborrheic keratosis 04/24/2014  ? Syncope 06/27/2013  ? TIA (transient ischemic attack) 10/18/2012  ?  States he has had about 5  times  ? Urine frequency   ? ?Past Surgical History:  ?Procedure Laterality Date  ? CATARACT EXTRACTION  2005  ? bilateral  ? CORNEAL TRANSPLANT  2010 left; 2013 right eye  ? HERNIA REPAIR  1985  ? Left  ? HERNIA REPAIR  03/2004  ? Right  ? SHOULDER DEBRIDEMENT Left 2005  ? TONSILLECTOMY Bilateral childhood  ? TRANSURETHRAL RESECTION OF PROSTATE N/A 10/2003  ? VASECTOMY    ? ? ?No Known Allergies ? ?Outpatient Encounter Medications as of 10/21/2021  ?Medication Sig  ? amLODipine (NORVASC) 5 MG tablet TAKE 1 TABLET DAILY  ? ammonium lactate (LAC-HYDRIN) 12 % lotion  Apply 1 application. topically daily.  ? aspirin 325 MG tablet Take 325 mg by mouth every evening.  ? atorvastatin (LIPITOR) 40 MG tablet TAKE 1 TABLET DAILY  ? buPROPion (WELLBUTRIN XL) 150 MG 24 hr tablet Take 150 mg by mouth every morning.  ? DORZOLAMIDE HCL-TIMOLOL MAL OP Place 1 drop into both eyes 2 (two) times daily.  ? enalapril (VASOTEC) 20 MG tablet Take 20 mg by mouth in the morning.  ? latanoprost (XALATAN) 0.005 % ophthalmic solution Place 1 drop into the left eye daily.  ? loratadine (CLARITIN) 10 MG tablet Take 10 mg by mouth daily.  ? melatonin 3 MG TABS tablet Take 3 mg by mouth at bedtime. 8pm  ? Multiple Vitamins-Minerals (PRESERVISION AREDS 2) CAPS Take 1 capsule by mouth daily.  ? senna-docusate (SENOKOT-S) 8.6-50 MG tablet Take 1 tablet by mouth daily.  ? ?No facility-administered encounter medications on file as of 10/21/2021.  ? ? ?Review of Systems  ?Unable to perform ROS: Dementia  ? ?Immunization History  ?Administered Date(s) Administered  ? Influenza Whole 05/03/2002, 04/19/2012, 04/20/2013  ? Influenza, High Dose Seasonal PF 05/03/2019  ? Influenza,inj,Quad PF,6+ Mos 04/21/2018  ? Influenza-Unspecified 05/03/2014, 04/18/2015, 04/30/2016, 04/18/2017, 04/09/2020, 05/13/2021  ? Moderna SARS-COV2 Booster Vaccination 12/25/2020  ? Moderna Sars-Covid-2 Vaccination 07/24/2019, 08/21/2019, 06/03/2020  ? Pension scheme manager 30yr & up 04/09/2021  ? Pneumococcal Conjugate-13 03/20/2010  ? Pneumococcal Polysaccharide-23 11/02/2017  ? Td 02/24/2018  ? Tdap 06/27/2011  ? Zoster Recombinat (Shingrix) 12/24/2017, 03/09/2018  ? Zoster, Live 11/30/2006  ? ?Pertinent  Health Maintenance Due  ?Topic Date Due  ? INFLUENZA VACCINE  02/17/2022  ? ? ?  08/15/2020  ?  9:16 AM 09/04/2020  ?  3:04 PM 12/04/2020  ?  1:31 PM 04/21/2021  ? 10:34 AM 08/25/2021  ? 11:46 AM  ?Fall Risk  ?Falls in the past year? 1 0 0  1  ?Was there an injury with Fall? 0    0  ?Fall Risk Category Calculator 2    1  ?Fall  Risk Category Moderate    Low  ?Patient Fall Risk Level Moderate fall risk   Moderate fall risk Moderate fall risk  ?Patient at Risk for Falls Due to     History of fall(s);Impaired balance/gait;Impaired mobility  ?Fall risk Follow up     Falls evaluation completed;Education provided  ? ?Functional Status Survey: ?  ? ?Vitals:  ? 10/21/21 1033  ?BP: 138/66  ?Pulse: 72  ?Resp: 20  ?Temp: (!) 97.5 ?F (36.4 ?C)  ?SpO2: 97%  ?Weight: 164 lb (74.4 kg)  ?Height: '5\' 7"'$  (1.702 m)  ? ?Body mass index is 25.69 kg/m?.Marland Kitchen?Physical Exam ?Vitals and nursing note reviewed.  ?Constitutional:   ?   Appearance: Normal appearance.  ?HENT:  ?  Head: Normocephalic and atraumatic.  ?   Nose: Nose normal.  ?   Mouth/Throat:  ?   Mouth: Mucous membranes are moist.  ?Eyes:  ?   Extraocular Movements: Extraocular movements intact.  ?   Conjunctiva/sclera: Conjunctivae normal.  ?   Comments: Left pupil is bigger than the right   ?Cardiovascular:  ?   Rate and Rhythm: Normal rate and regular rhythm.  ?   Heart sounds: No murmur heard. ?Pulmonary:  ?   Effort: Pulmonary effort is normal.  ?   Breath sounds: No wheezing, rhonchi or rales.  ?Abdominal:  ?   General: Bowel sounds are normal.  ?   Palpations: Abdomen is soft.  ?   Tenderness: There is no abdominal tenderness.  ?Musculoskeletal:  ?   Cervical back: Normal range of motion and neck supple.  ?   Right lower leg: Edema present.  ?   Left lower leg: Edema present.  ?   Comments: Trace edema BLE  ?Skin: ?   General: Skin is warm and dry.  ?Neurological:  ?   General: No focal deficit present.  ?   Mental Status: He is alert. Mental status is at baseline.  ?   Motor: No weakness.  ?   Coordination: Coordination normal.  ?   Gait: Gait abnormal.  ?   Comments: Oriented to person, maybe place.   ?Psychiatric:  ?   Comments: Appears anxious  ? ? ?Labs reviewed: ?Recent Labs  ?  04/10/21 ?0000 04/21/21 ?1119 10/06/21 ?0000  ?NA 139 138 141  ?K 4.0 4.1 4.0  ?CL 106 109 107  ?CO2 25* 22 29*   ?GLUCOSE  --  90  --   ?BUN 25* 26* 28*  ?CREATININE 1.0 0.93 1.2  ?CALCIUM 8.7 8.6* 8.5*  ? ?Recent Labs  ?  04/07/21 ?0000 04/10/21 ?0000 10/06/21 ?0000  ?AST '26 24 17  '$ ?ALT '30 28 19  '$ ?ALKPHOS 59 57 55  ?

## 2021-10-21 NOTE — Assessment & Plan Note (Signed)
Seems progressing, MMSE 27/30 04/04/21, MRI 2019 chronic small vessel ischemic disease. repeat MMSE ?

## 2021-10-21 NOTE — Assessment & Plan Note (Signed)
refused antidepressant in the past. Currently on Wellbutrin '150mg'$  qd since 06/19/21(started Wellbutrin '75mg'$  bid 05/29/21). Reported the patient gets up midnight, dressed, looks for breakfast,  the patient stated he didn't want sleeping pill, staying up at night does not bother him, may bother staff. TSH 2.73 04/07/21 ? the patient's son HPOA requested med review, c/o the patient's escalated worries,, hyper-focusing behaviors, the patient calls his son agitated and tells him something needs to be done but cannot verbalized what in particular. The patient denied pain or discomfort, unable to give reliable HPI.  ?Taper off Wellbutrin-the patient seems more anxious. ?Will start Sertraline '25mg'$  qd x 1 wk, then increase to '50mg'$  qd, BMP in one week. Observe.  ?

## 2021-10-21 NOTE — Assessment & Plan Note (Signed)
Stable,  on Senokot S Iqhs ?

## 2022-01-07 ENCOUNTER — Non-Acute Institutional Stay: Payer: Medicare Other | Admitting: Orthopedic Surgery

## 2022-01-07 ENCOUNTER — Encounter: Payer: Self-pay | Admitting: Orthopedic Surgery

## 2022-01-07 DIAGNOSIS — H353132 Nonexudative age-related macular degeneration, bilateral, intermediate dry stage: Secondary | ICD-10-CM

## 2022-01-07 DIAGNOSIS — I10 Essential (primary) hypertension: Secondary | ICD-10-CM

## 2022-01-07 DIAGNOSIS — E785 Hyperlipidemia, unspecified: Secondary | ICD-10-CM | POA: Diagnosis not present

## 2022-01-07 DIAGNOSIS — I69919 Unspecified symptoms and signs involving cognitive functions following unspecified cerebrovascular disease: Secondary | ICD-10-CM | POA: Diagnosis not present

## 2022-01-07 DIAGNOSIS — G459 Transient cerebral ischemic attack, unspecified: Secondary | ICD-10-CM | POA: Diagnosis not present

## 2022-01-07 DIAGNOSIS — K5904 Chronic idiopathic constipation: Secondary | ICD-10-CM

## 2022-01-07 DIAGNOSIS — G4700 Insomnia, unspecified: Secondary | ICD-10-CM

## 2022-01-07 DIAGNOSIS — L719 Rosacea, unspecified: Secondary | ICD-10-CM

## 2022-01-07 DIAGNOSIS — F339 Major depressive disorder, recurrent, unspecified: Secondary | ICD-10-CM

## 2022-01-07 NOTE — Progress Notes (Signed)
Location:   [place] Room Number: 29 Place of Service:  ALF [id number]) Provider:  Yvonna Alanis, NP    Virgie Dad, MD  Patient Care Team: Virgie Dad, MD as PCP - General (Internal Medicine) Melina Modena, [facility] Crista Luria, MD as Consulting Physician (Dermatology) Marilynne Halsted, MD as Referring Physician (Ophthalmology) Bond, Tracie Harrier, MD as Referring Physician (Ophthalmology) Virgie Dad, MD as Referring Physician (Internal Medicine)  Extended Emergency Contact Information Primary Emergency Contact: Osvaldo Human of [place] Phone: [phone] Relation: Son  Code Status:  DNR Goals of care: Advanced Directive information    01/07/2022   10:18 AM  Advanced Directives  Does Patient Have a Medical Advance Directive? Yes  Type of Paramedic of [place];Living will;Out of facility DNR (pink MOST or yellow form)  Does patient want to make changes to medical advance directive? No - Patient declined  Copy of [place] in Chart? Yes - validated most recent copy scanned in chart (See row information)  Pre-existing out of facility DNR order (yellow form or pink MOST form) Yellow form placed in chart (order not valid for inpatient use)     Chief Complaint  Patient presents with   Medical Management of Chronic Issues    HPI:  Pt is a 88 y.o. male seen today for medical management of chronic diseases.    He currently resides on the assisted living unit at [facility]. PMH: HTN, HLD, TIA, mixed hearing loss, actinic keratosis, glaucoma, joint pain, and depression.   HTN- BUN/creat 28/1.2 10/06/2021, remains on enalapril and amlodipine, see trends below HLD- LDL 73 10/06/2021, remains on Lipitor TIA- last episode reported 10/18/2012, remains on asa and statin Cognitive deficit- MMSE 27/30 03/2021, MRI brain 12/2017 revealed age related cerebral atrophy with moderate to  advanced chronic small vessel ischemic disease, ambulates with walker, no behaviors, sometimes non verbal Insomnia- noted to stay up at night and sleep more during day, remains on melatonin Depression- unsuccessful trial of Wellbutrin ( increased anxiety), remains on Zoloft Dry skin/Rosacea- remains on ammonium lactate and Sarna lotion Macular degeneration- denies ocular pain, remains on AREDS vitamin Constipation- cannot remember last BM, remains on senna  No recent falls or injuries. Ambulates with walker.   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/18/2012   Deafness 10/18/2012   Endothelial corneal dystrophy    Essential hypertension 10/18/2012   Hammer toe 04/24/2014   Hyperlipidemia 10/18/2012   Intermediate stage nonexudative age-related macular degeneration of both eyes 04/16/2016   Nocturia 05/19/2016   Occlusion and stenosis of carotid artery with cerebral infarction    Other specified cardiac dysrhythmias(427.89)    Pain in joint, pelvic region and thigh    Paresthesia 04/30/2015   Toes of both feet    Peripheral vascular disease, unspecified ([place])    Primary open angle glaucoma 09/25/2011   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   Rosacea    Seborrheic keratosis 04/24/2014   Syncope 06/27/2013   TIA (transient ischemic attack)  10/18/2012   States he has had about 5  times   Urine frequency    Past Surgical History:  Procedure Laterality Date   CATARACT EXTRACTION  2005   bilateral   CORNEAL TRANSPLANT  2010 left; 2013 right eye   HERNIA REPAIR  1985   Left   HERNIA  REPAIR  03/2004   Right   SHOULDER DEBRIDEMENT Left 2005   TONSILLECTOMY Bilateral childhood   TRANSURETHRAL RESECTION OF PROSTATE N/A 10/2003   VASECTOMY      No Known Allergies  Allergies as of 01/07/2022   No Known Allergies      Medication List        Accurate as of January 07, 2022 10:18 AM. If you have any questions, ask your nurse or doctor.          STOP taking these medications    buPROPion 150 MG 24 hr tablet Commonly known as: WELLBUTRIN XL Stopped by: Yvonna Alanis, NP       TAKE these medications    amLODipine 5 MG tablet Commonly known as: NORVASC TAKE 1 TABLET DAILY   ammonium lactate 12 % lotion Commonly known as: LAC-HYDRIN Apply 1 application. topically daily.   aspirin 325 MG tablet Take 325 mg by mouth every evening.   atorvastatin 40 MG tablet Commonly known as: LIPITOR TAKE 1 TABLET DAILY   camphor-menthol lotion Commonly known as: SARNA Apply 1 Application topically 2 (two) times daily as needed for itching.   DORZOLAMIDE HCL-TIMOLOL MAL OP Place 1 drop into both eyes 2 (two) times daily.   enalapril 20 MG tablet Commonly known as: VASOTEC Take 20 mg by mouth in the morning.   latanoprost 0.005 % ophthalmic solution Commonly known as: XALATAN Place 1 drop into the left eye daily.   loratadine 10 MG tablet Commonly known as: CLARITIN Take 10 mg by mouth daily.   melatonin 3 MG Tabs tablet Take 3 mg by mouth at bedtime. 8pm   PreserVision AREDS 2 Caps Take 1 capsule by mouth daily.   senna-docusate 8.6-50 MG tablet Commonly known as: Senokot-S Take 1 tablet by mouth daily.   sertraline 50 MG tablet Commonly known as: ZOLOFT Take 50 mg by mouth daily.        Review of Systems  Unable to perform ROS: Dementia    Immunization History  Administered Date(s) Administered   Influenza Whole 05/03/2002, 04/19/2012, 04/20/2013   Influenza, High Dose Seasonal PF 05/03/2019   Influenza,inj,Quad PF,6+ Mos 04/21/2018    Influenza-Unspecified 05/03/2014, 04/18/2015, 04/30/2016, 04/18/2017, 04/09/2020, 05/13/2021   Moderna SARS-COV2 Booster Vaccination 12/25/2020   Moderna Sars-Covid-2 Vaccination 07/24/2019, 08/21/2019, 06/03/2020   Pfizer Covid-19 Vaccine Bivalent Booster 66yr & up 04/09/2021   Pneumococcal Conjugate-13 03/20/2010   Pneumococcal Polysaccharide-23 11/02/2017   Td 02/24/2018   Tdap 06/27/2011   Zoster Recombinat (Shingrix) 12/24/2017, 03/09/2018   Zoster, Live 11/30/2006   Pertinent  Health Maintenance Due  Topic Date Due   INFLUENZA VACCINE  02/17/2022      08/15/2020    9:16 AM 09/04/2020    3:04 PM 12/04/2020    1:31 PM 04/21/2021   10:34 AM 08/25/2021   11:46 AM  Fall Risk  Falls in the past year? 1 0 0  1  Was there an injury with Fall? 0    0  Fall Risk Category Calculator 2    1  Fall Risk Category Moderate    Low  Patient Fall Risk Level Moderate fall risk  Moderate fall risk Moderate fall risk  Patient at Risk for Falls Due to     History of fall(s);Impaired balance/gait;Impaired mobility  Fall risk Follow up     Falls evaluation completed;Education provided   Functional Status Survey:    Vitals:   01/07/22 1014  BP: (!) 150/83  Pulse: 86  Resp: 18  Temp: (!) 97.1 F (36.2 C)  SpO2: 96%  Weight: 156 lb 12.8 oz (71.1 kg)  Height: '5\' 7"'$  (1.702 m)   Body mass index is 24.56 kg/m. Physical Exam Vitals reviewed.  Constitutional:      General: He is not in acute distress. HENT:     Head: Normocephalic.     Right Ear: There is no impacted cerumen.     Left Ear: There is no impacted cerumen.     Nose: Nose normal.     Mouth/Throat:     Mouth: Mucous membranes are moist.  Eyes:     General:        Right eye: No discharge.        Left eye: No discharge.  Cardiovascular:     Rate and Rhythm: Normal rate and regular rhythm.     Pulses: Normal pulses.     Heart sounds: Normal heart sounds.  Pulmonary:     Effort: Pulmonary effort is normal. No respiratory  distress.     Breath sounds: Normal breath sounds. No wheezing.  Abdominal:     General: Bowel sounds are normal. There is no distension.     Palpations: Abdomen is soft.     Tenderness: There is no abdominal tenderness.  Musculoskeletal:     Cervical back: Neck supple.     Right lower leg: No edema.     Left lower leg: No edema.  Skin:    General: Skin is warm and dry.     Capillary Refill: Capillary refill takes less than 2 seconds.  Neurological:     General: No focal deficit present.     Mental Status: He is alert. Mental status is at baseline.     Motor: Weakness present.     Gait: Gait abnormal.     Comments: walker  Psychiatric:        Mood and Affect: Mood normal.        Behavior: Behavior normal.        Cognition and Memory: Cognition is impaired.     Comments: Very pleasant, gave short responses, followed commands, alert to self/familiar face     Labs reviewed: Recent Labs    04/10/21 0000 04/21/21 1119 10/06/21 0000  NA 139 138 141  K 4.0 4.1 4.0  CL 106 109 107  CO2 25* 22 29*  GLUCOSE  --  90  --   BUN 25* 26* 28*  CREATININE 1.0 0.93 1.2  CALCIUM 8.7 8.6* 8.5*   Recent Labs    04/07/21 0000 04/10/21 0000 10/06/21 0000  AST '26 24 17  '$ ALT '30 28 19  '$ ALKPHOS 59 57 55  ALBUMIN 4.0 4.0 3.5   Recent Labs    04/10/21 0000 04/21/21 1119 10/06/21 0000  WBC 8.8 8.1 6.3  NEUTROABS 5,562.00 5.9 3,522.00  HGB 14.9 14.5 14.6  HCT 43 42.6 43  MCV  --  91.4  --   PLT 219 192 182   Lab Results  Component Value Date   TSH 2.73 04/07/2021   No results found for: "HGBA1C" Lab Results  Component Value Date   CHOL 126 10/06/2021  HDL 41 10/06/2021   LDLCALC 73 10/06/2021   TRIG 50 10/06/2021   CHOLHDL 2.9 05/09/2020    Significant Diagnostic Results in last 30 days:  No results found.  Assessment/Plan 1. Essential hypertension - controlled  - cont amlodipine and enalapril  2. Hyperlipidemia, unspecified hyperlipidemia type - LDL stable -  cont Lipitor  3. TIA (transient ischemic attack) - last episode 10/2012 - cont asa and statin  4. 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 Insomnia, unspecified type - stays up late at night - cont melatonin  6. Depression, recurrent ([place]) - unsuccessful trial of Wellbutrin (increased anxiety) - seen out of room often, attends activities offered - cont Zoloft  7. Rosacea - cont ammonium lactate and Sarna lotion  8. Intermediate stage nonexudative age-related macular degeneration of both eyes - followed by Dr. Cordelia Pen - cont AREDS vitamin  9. Chronic idiopathic constipation - abdomen soft - cont senna   Family/ staff Communication: plan discussed with patient and nurse  Labs/tests ordered:  none

## 2022-03-26 ENCOUNTER — Encounter: Payer: Self-pay | Admitting: Internal Medicine

## 2022-03-26 ENCOUNTER — Non-Acute Institutional Stay: Payer: Medicare Other | Admitting: Internal Medicine

## 2022-03-26 DIAGNOSIS — I10 Essential (primary) hypertension: Secondary | ICD-10-CM | POA: Diagnosis not present

## 2022-03-26 DIAGNOSIS — F339 Major depressive disorder, recurrent, unspecified: Secondary | ICD-10-CM | POA: Diagnosis not present

## 2022-03-26 DIAGNOSIS — G459 Transient cerebral ischemic attack, unspecified: Secondary | ICD-10-CM | POA: Diagnosis not present

## 2022-03-26 DIAGNOSIS — I69919 Unspecified symptoms and signs involving cognitive functions following unspecified cerebrovascular disease: Secondary | ICD-10-CM

## 2022-03-26 DIAGNOSIS — E785 Hyperlipidemia, unspecified: Secondary | ICD-10-CM | POA: Diagnosis not present

## 2022-03-26 NOTE — Progress Notes (Signed)
Location:   [place] Room Number: 29-A Place of Service:  ALF [id number]) Provider:  Veleta Miners, MD    Patient Care Team: Russell Dad, MD as PCP - General (Internal Medicine) Melina Modena, [facility] Crista Luria, MD as Consulting Physician (Dermatology) Marilynne Halsted, MD as Referring Physician (Ophthalmology) Bond, Tracie Harrier, MD as Referring Physician (Ophthalmology) Russell Dad, MD as Referring Physician (Internal Medicine)  Extended Emergency Contact Information Primary Emergency Contact: Tawny Hopping [place] of [place] Phone: [phone] Relation: Son  Code Status:  DNR Goals of care: Advanced Directive information    03/26/2022    1:42 PM  Advanced Directives  Does Patient Have a Medical Advance Directive? Yes  Type of Paramedic of [place];Living will;Out of facility DNR (pink MOST or yellow form)  Does patient want to make changes to medical advance directive? No - Patient declined  Copy of [place] in Chart? Yes - validated most recent copy scanned in chart (See row information)     Chief Complaint  Patient presents with   Medical Management of Chronic Issues    Routine Visit.    Immunizations    Discuss the need for Covid Booster, and Influenza vaccine.     HPI:  Pt is a 88 y.o. male seen today for medical management of chronic diseases.    Seen For Regular Visit Lives in [place] with his walker  Patient has h/o Hypertension and Depression, TIA, Skin cancer Also has h/o TIA versus migraine Had detailed work-up by neurology for his atypical symptoms of visual impairment. With Aphasia and Word finding.He has had detail work ups including EEG, Echo and MRI .which have been Negative. They have recommended Asprin, BP control and Statin.    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/18/2012   Deafness 10/18/2012   Endothelial corneal dystrophy    Essential hypertension 10/18/2012   Hammer toe 04/24/2014   Hyperlipidemia 10/18/2012   Intermediate stage nonexudative age-related macular degeneration of both eyes 04/16/2016   Nocturia 05/19/2016   Occlusion and stenosis of carotid artery with cerebral infarction    Other specified cardiac dysrhythmias(427.89)    Pain in joint, pelvic region and thigh    Paresthesia 04/30/2015   Toes of both feet    Peripheral vascular disease, unspecified ([place])    Primary open angle glaucoma 09/25/2011   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   Rosacea    Seborrheic keratosis 04/24/2014   Syncope 06/27/2013   TIA (transient ischemic attack) 10/18/2012   States he has had about 5  times   Urine frequency    Past Surgical History:  Procedure Laterality Date   CATARACT EXTRACTION  2005   bilateral   CORNEAL TRANSPLANT  2010 left; 2013 right eye  HERNIA REPAIR  1985   Left   HERNIA REPAIR  03/2004   Right   SHOULDER DEBRIDEMENT Left 2005   TONSILLECTOMY Bilateral childhood   TRANSURETHRAL RESECTION OF PROSTATE N/A 10/2003   VASECTOMY      No Known Allergies  Allergies as of 03/26/2022   No Known Allergies      Medication List        Accurate as of March 26, 2022  1:42 PM. If you have any questions, ask your nurse or doctor.          amLODipine 5 MG tablet Commonly known as: NORVASC TAKE  1 TABLET DAILY   ammonium lactate 12 % lotion Commonly known as: LAC-HYDRIN Apply 1 application. topically daily.   aspirin 325 MG tablet Take 325 mg by mouth every evening.   atorvastatin 40 MG tablet Commonly known as: LIPITOR TAKE 1 TABLET DAILY   camphor-menthol lotion Commonly known as: SARNA Apply 1 Application topically 2 (two) times daily as needed for itching.   DORZOLAMIDE HCL-TIMOLOL MAL OP Place 1 drop into both eyes 2 (two) times daily.   enalapril 20 MG tablet Commonly known as: VASOTEC Take 20 mg by mouth in the morning.   latanoprost 0.005 % ophthalmic solution Commonly known as: XALATAN Place 1 drop into the left eye daily.   loratadine 10 MG tablet Commonly known as: CLARITIN Take 10 mg by mouth daily.   melatonin 3 MG Tabs tablet Take 3 mg by mouth at bedtime. 8pm   PreserVision AREDS 2 Caps Take 1 capsule by mouth daily.   senna-docusate 8.6-50 MG tablet Commonly known as: Senokot-S Take 1 tablet by mouth daily.   sertraline 50 MG tablet Commonly known as: ZOLOFT Take 50 mg by mouth daily.        Review of Systems  Unable to perform ROS: Dementia    Immunization History  Administered Date(s) Administered   Influenza Whole 05/03/2002, 04/19/2012, 04/20/2013   Influenza, High Dose Seasonal PF 05/03/2019   Influenza,inj,Quad PF,6+ Mos 04/21/2018   Influenza-Unspecified 05/03/2014, 04/18/2015, 04/30/2016, 04/18/2017, 04/09/2020, 05/13/2021   Moderna SARS-COV2 Booster Vaccination 12/25/2020   Moderna Sars-Covid-2 Vaccination 07/24/2019, 08/21/2019, 06/03/2020, 12/31/2021   Pfizer Covid-19 Vaccine Bivalent Booster 76yr & up 04/09/2021   Pneumococcal Conjugate-13 03/20/2010   Pneumococcal Polysaccharide-23 11/02/2017   Td 02/24/2018   Tdap 06/27/2011   Zoster Recombinat (Shingrix) 12/24/2017, 03/09/2018   Zoster, Live 11/30/2006   Pertinent  Health Maintenance Due  Topic Date Due   INFLUENZA VACCINE  02/17/2022      08/15/2020     9:16 AM 09/04/2020    3:04 PM 12/04/2020    1:31 PM 04/21/2021   10:34 AM 08/25/2021   11:46 AM  Fall Risk  Falls in the past year? 1 0 0  1  Was there an injury with Fall? 0    0  Fall Risk Category Calculator 2    1  Fall Risk Category Moderate    Low  Patient Fall Risk Level Moderate fall risk   Moderate fall risk Moderate fall risk  Patient at Risk for Falls Due to     History of fall(s);Impaired balance/gait;Impaired mobility  Fall risk Follow up     Falls evaluation completed;Education provided   Functional Status Survey:    Vitals:   03/26/22 1331  BP: (!) 149/69  Pulse: 78  Resp: 16  Temp: 98.8 F (37.1 C)  SpO2: 97%  Weight: 159 lb 9.6 oz (72.4 kg)  Height: '5\' 7"'$  (  1.702 m)   Body mass index is 25 kg/m. Physical Exam Vitals reviewed.  Constitutional:      Appearance: Normal appearance.  HENT:     Head: Normocephalic.     Nose: Nose normal.     Mouth/Throat:     Mouth: Mucous membranes are moist.     Pharynx: Oropharynx is clear.  Eyes:     Pupils: Pupils are equal, round, and reactive to light.  Cardiovascular:     Rate and Rhythm: Normal rate and regular rhythm.     Pulses: Normal pulses.     Heart sounds: No murmur heard. Pulmonary:     Effort: Pulmonary effort is normal. No respiratory distress.     Breath sounds: Normal breath sounds. No rales.  Abdominal:     General: Abdomen is flat. Bowel sounds are normal.     Palpations: Abdomen is soft.  Musculoskeletal:        General: No swelling.     Cervical back: Neck supple.  Skin:    General: Skin is warm.  Neurological:     General: No focal deficit present.     Mental Status: He is alert.     Comments: More struggling with words   Psychiatric:        Mood and Affect: Mood normal.        Thought Content: Thought content normal.     Labs reviewed: Recent Labs    04/10/21 0000 04/21/21 1119 10/06/21 0000  NA 139 138 141  K 4.0 4.1 4.0  CL 106 109 107  CO2 25* 22 29*  GLUCOSE  --  90  --    BUN 25* 26* 28*  CREATININE 1.0 0.93 1.2  CALCIUM 8.7 8.6* 8.5*   Recent Labs    04/07/21 0000 04/10/21 0000 10/06/21 0000  AST '26 24 17  '$ ALT '30 28 19  '$ ALKPHOS 59 57 55  ALBUMIN 4.0 4.0 3.5   Recent Labs    04/10/21 0000 04/21/21 1119 10/06/21 0000  WBC 8.8 8.1 6.3  NEUTROABS 5,562.00 5.9 3,522.00  HGB 14.9 14.5 14.6  HCT 43 42.6 43  MCV  --  91.4  --   PLT 219 192 182   Lab Results  Component Value Date   TSH 2.73 04/07/2021   No results found for: "HGBA1C" Lab Results  Component Value Date   CHOL 126 10/06/2021   HDL 41 10/06/2021   LDLCALC 73 10/06/2021   TRIG 50 10/06/2021   CHOLHDL 2.9 05/09/2020    Significant Diagnostic Results in last 30 days:  No results found.  Assessment/Plan 1. TIA (transient ischemic attack) Full dose aspirin Last MRI in 2019 showed Age-related cerebral atrophy with moderate to advanced chronic small vessel ischemic disease.  Also on Statin LDL 73 in 9/22  2. Essential hypertension On Norvasc and Vasotec  3. Depression, recurrent ([place]) On Zoloft  4. Hyperlipidemia, unspecified hyperlipidemia type On statin 5. 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

## 2022-03-30 LAB — LIPID PANEL
Cholesterol: 155 (ref 0–200)
HDL: 38 (ref 35–70)
LDL Cholesterol: 98
LDl/HDL Ratio: 4.1
Triglycerides: 92 (ref 40–160)

## 2022-03-30 LAB — BASIC METABOLIC PANEL
BUN: 26 — AB (ref 4–21)
CO2: 26 — AB (ref 13–22)
Chloride: 105 (ref 99–108)
Creatinine: 1.1 (ref 0.6–1.3)
Glucose: 78
Potassium: 4.2 mEq/L (ref 3.5–5.1)
Sodium: 141 (ref 137–147)

## 2022-03-30 LAB — COMPREHENSIVE METABOLIC PANEL
Albumin: 4 (ref 3.5–5.0)
Calcium: 9 (ref 8.7–10.7)
Globulin: 2.2
eGFR: 64

## 2022-03-30 LAB — HEPATIC FUNCTION PANEL
ALT: 20 U/L (ref 10–40)
AST: 22 (ref 14–40)
Alkaline Phosphatase: 69 (ref 25–125)
Bilirubin, Total: 0.8

## 2022-03-30 LAB — TSH: TSH: 2.88 (ref 0.41–5.90)

## 2022-03-30 LAB — CBC AND DIFFERENTIAL
HCT: 46 (ref 41–53)
Hemoglobin: 16 (ref 13.5–17.5)
Platelets: 223 10*3/uL (ref 150–400)
WBC: 12

## 2022-03-30 LAB — CBC: RBC: 5.22 — AB (ref 3.87–5.11)

## 2022-03-31 ENCOUNTER — Non-Acute Institutional Stay: Payer: Medicare Other | Admitting: Orthopedic Surgery

## 2022-03-31 ENCOUNTER — Encounter: Payer: Self-pay | Admitting: Orthopedic Surgery

## 2022-03-31 DIAGNOSIS — G459 Transient cerebral ischemic attack, unspecified: Secondary | ICD-10-CM

## 2022-03-31 DIAGNOSIS — I10 Essential (primary) hypertension: Secondary | ICD-10-CM

## 2022-03-31 DIAGNOSIS — E785 Hyperlipidemia, unspecified: Secondary | ICD-10-CM | POA: Diagnosis not present

## 2022-03-31 DIAGNOSIS — I69919 Unspecified symptoms and signs involving cognitive functions following unspecified cerebrovascular disease: Secondary | ICD-10-CM

## 2022-03-31 DIAGNOSIS — D72829 Elevated white blood cell count, unspecified: Secondary | ICD-10-CM | POA: Diagnosis not present

## 2022-03-31 NOTE — Progress Notes (Unsigned)
Location:   [place] Room Number: 29-A Place of Service:  ALF [phone]) Provider:  Windell Moulding, NP  PCP: Virgie Dad, MD  Patient Care Team: Virgie Dad, MD as PCP - General (Internal Medicine) Melina Modena, [facility] Crista Luria, MD as Consulting Physician (Dermatology) Marilynne Halsted, MD as Referring Physician (Ophthalmology) Bond, Tracie Harrier, MD as Referring Physician (Ophthalmology) Virgie Dad, MD as Referring Physician (Internal Medicine)  Extended Emergency Contact Information Primary Emergency Contact: Tawny Hopping [place] of [place] Phone: [phone] Relation: Son  Code Status:  DNR Goals of care: Advanced Directive information    03/31/2022   11:34 AM  Advanced Directives  Does Patient Have a Medical Advance Directive? Yes  Type of Paramedic of [place];Living will;Out of facility DNR (pink MOST or yellow form)  Does patient want to make changes to medical advance directive? No - Patient declined  Copy of [place] in Chart? Yes - validated most recent copy scanned in chart (See row information)     Chief Complaint  Patient presents with   Acute Visit    Elevated WBC.    HPI:  Russell [name] is a 88 y.o. male seen today for an acute visit for elevated WBC.   09/11 WBC 12.0> was 6.3 (03/20). He is a poor historian due to impaired memory. Nursing no complaints. Eating and drinking well. No cold symptoms. Afebrile. Vitals stable.   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/18/2012   Deafness 10/18/2012   Endothelial corneal dystrophy    Essential hypertension 10/18/2012   Hammer toe 04/24/2014   Hyperlipidemia 10/18/2012   Intermediate stage nonexudative age-related macular degeneration of both eyes 04/16/2016   Nocturia 05/19/2016   Occlusion and stenosis of carotid artery with cerebral infarction    Other specified cardiac dysrhythmias(427.89)    Pain in joint, pelvic region and thigh    Paresthesia 04/30/2015   Toes of both feet    Peripheral vascular disease, unspecified ([place])    Primary open angle glaucoma 09/25/2011   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   Rosacea    Seborrheic keratosis 04/24/2014   Syncope 06/27/2013   TIA (transient ischemic attack) 10/18/2012   States he has had about 5  times   Urine frequency    Past Surgical History:  Procedure Laterality Date   CATARACT EXTRACTION  2005   bilateral   CORNEAL TRANSPLANT  2010 left; 2013 right eye   HERNIA REPAIR  1985   Left   HERNIA REPAIR  03/2004   Right   SHOULDER DEBRIDEMENT Left 2005   TONSILLECTOMY Bilateral childhood   TRANSURETHRAL RESECTION OF PROSTATE N/A 10/2003   VASECTOMY      No Known Allergies  Allergies as of 03/31/2022   No Known Allergies      Medication List  Accurate as of March 31, 2022 11:34 AM. If you have any questions, ask your nurse or doctor.          amLODipine 5 MG tablet Commonly known as: NORVASC TAKE 1 TABLET DAILY   ammonium lactate 12 % lotion Commonly known as: LAC-HYDRIN Apply 1 application. topically daily.   aspirin 325 MG tablet Take 325 mg by mouth every evening.   atorvastatin 40 MG tablet Commonly known as: LIPITOR TAKE 1 TABLET DAILY   camphor-menthol  lotion Commonly known as: SARNA Apply 1 Application topically 2 (two) times daily as needed for itching.   DORZOLAMIDE HCL-TIMOLOL MAL OP Place 1 drop into both eyes 2 (two) times daily.   enalapril 20 MG tablet Commonly known as: VASOTEC Take 20 mg by mouth in the morning.   latanoprost 0.005 % ophthalmic solution Commonly known as: XALATAN Place 1 drop into the left eye daily.   loratadine 10 MG tablet Commonly known as: CLARITIN Take 10 mg by mouth daily.   melatonin 3 MG Tabs tablet Take 3 mg by mouth at bedtime. 8pm   MULTIVITAMIN ADULTS PO Take 1 capsule by mouth every morning.   senna-docusate 8.6-50 MG tablet Commonly known as: Senokot-S Take 1 tablet by mouth daily.   sertraline 50 MG tablet Commonly known as: ZOLOFT Take 50 mg by mouth daily.        Review of Systems  Unable to perform ROS: Dementia    Immunization History  Administered Date(s) Administered   Influenza Whole 05/03/2002, 04/19/2012, 04/20/2013   Influenza, High Dose Seasonal PF 05/03/2019   Influenza,inj,Quad PF,6+ Mos 04/21/2018   Influenza-Unspecified 05/03/2014, 04/18/2015, 04/30/2016, 04/18/2017, 04/09/2020, 05/13/2021   Moderna SARS-COV2 Booster Vaccination 12/25/2020   Moderna Sars-Covid-2 Vaccination 07/24/2019, 08/21/2019, 06/03/2020, 12/31/2021   Pfizer Covid-19 Vaccine Bivalent Booster 76yr & up 04/09/2021   Pneumococcal Conjugate-13 03/20/2010   Pneumococcal Polysaccharide-23 11/02/2017   Td 02/24/2018   Tdap 06/27/2011   Zoster Recombinat (Shingrix) 12/24/2017, 03/09/2018   Zoster, Live 11/30/2006   Pertinent  Health Maintenance Due  Topic Date Due   INFLUENZA VACCINE  02/17/2022      08/15/2020    9:16 AM 09/04/2020    3:04 PM 12/04/2020    1:31 PM 04/21/2021   10:34 AM 08/25/2021   11:46 AM  Fall Risk  Falls in the past year? 1 0 0  1  Was there an injury with Fall? 0    0  Fall Risk Category Calculator 2    1  Fall Risk Category Moderate    Low  Patient Fall  Risk Level Moderate fall risk   Moderate fall risk Moderate fall risk  Patient at Risk for Falls Due to     History of fall(s);Impaired balance/gait;Impaired mobility  Fall risk Follow up     Falls evaluation completed;Education provided   Functional Status Survey:    Vitals:   03/31/22 1129  BP: (!) 149/69  Pulse: 78  Resp: 16  Temp: 98.8 F (37.1 C)  SpO2: 97%  Weight: 159 lb 9.6 oz (72.4 kg)  Height: '5\' 7"'$  (1.702 m)   Body mass index is 25 kg/m. Physical Exam Vitals reviewed.  Constitutional:      General: He is not in acute distress. HENT:     Head: Normocephalic.     Right Ear: There is no impacted cerumen.     Left Ear: There is no impacted cerumen.     Nose: Nose normal.     Mouth/Throat:  Mouth: Mucous membranes are moist.  Eyes:     General:        Right eye: No discharge.        Left eye: No discharge.  Cardiovascular:     Rate and Rhythm: Normal rate and regular rhythm.     Pulses: Normal pulses.     Heart sounds: Normal heart sounds.  Pulmonary:     Effort: Pulmonary effort is normal. No respiratory distress.     Breath sounds: Normal breath sounds. No wheezing.  Abdominal:     General: Bowel sounds are normal. There is no distension.     Palpations: Abdomen is soft.     Tenderness: There is no abdominal tenderness.  Musculoskeletal:     Cervical back: Neck supple.     Right lower leg: No edema.     Left lower leg: No edema.  Skin:    General: Skin is warm and dry.     Capillary Refill: Capillary refill takes less than 2 seconds.  Neurological:     General: No focal deficit present.     Mental Status: He is alert. Mental status is at baseline.     Motor: Weakness present.     Gait: Gait abnormal.     Comments: walker  Psychiatric:        Mood and Affect: Mood normal.        Behavior: Behavior normal.     Comments: Very pleasant, follows commands, nonverbal, alert to self/person     Labs reviewed: Recent Labs    04/10/21 0000  04/21/21 1119 10/06/21 0000  NA 139 138 141  K 4.0 4.1 4.0  CL 106 109 107  CO2 25* 22 29*  GLUCOSE  --  90  --   BUN 25* 26* 28*  CREATININE 1.0 0.93 1.2  CALCIUM 8.7 8.6* 8.5*   Recent Labs    04/07/21 0000 04/10/21 0000 10/06/21 0000  AST '26 24 17  '$ ALT '30 28 19  '$ ALKPHOS 59 57 55  ALBUMIN 4.0 4.0 3.5   Recent Labs    04/10/21 0000 04/21/21 1119 10/06/21 0000  WBC 8.8 8.1 6.3  NEUTROABS 5,562.00 5.9 3,522.00  HGB 14.9 14.5 14.6  HCT 43 42.6 43  MCV  --  91.4  --   PLT 219 192 182   Lab Results  Component Value Date   TSH 2.73 04/07/2021   No results found for: "HGBA1C" Lab Results  Component Value Date   CHOL 126 10/06/2021   HDL 41 10/06/2021   LDLCALC 73 10/06/2021   TRIG 50 10/06/2021   CHOLHDL 2.9 05/09/2020    Significant Diagnostic Results in last 30 days:  No results found.  Assessment/Plan 1. Leukocytosis, unspecified type - 09/11 WBC 12.0> was 6.3 (03/20) - no sign of infection, afebrile - repeat cbc/diff 09/14  2. 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 Essential hypertension - controlled with enalapril and amlodipine  4. Hyperlipidemia, unspecified hyperlipidemia type - LDL stable - cont Lipitor  5. TIA (transient ischemic attack) - cont asa and Lipitor    Family/ staff Communication: plan discussed with patient and nurse  Labs/tests ordered: cbc/diff 09/14

## 2022-04-02 LAB — CBC AND DIFFERENTIAL
HCT: 44 (ref 41–53)
Hemoglobin: 15.5 (ref 13.5–17.5)
Neutrophils Absolute: 5746
Platelets: 201 10*3/uL (ref 150–400)
WBC: 11.4

## 2022-04-02 LAB — CBC: RBC: 5 (ref 3.87–5.11)

## 2022-04-04 ENCOUNTER — Telehealth: Payer: Self-pay | Admitting: Adult Health

## 2022-04-04 NOTE — Telephone Encounter (Signed)
Nurse called to report that this resident tested positive for covid. He is having cough, congestion, and fatigue. No sob, fever, or low 02 sat. Will prescribe paxlovid per pharmacy (for med and renal function review).

## 2022-04-06 ENCOUNTER — Non-Acute Institutional Stay: Payer: Medicare Other | Admitting: Orthopedic Surgery

## 2022-04-06 ENCOUNTER — Encounter: Payer: Self-pay | Admitting: Orthopedic Surgery

## 2022-04-06 DIAGNOSIS — D72829 Elevated white blood cell count, unspecified: Secondary | ICD-10-CM | POA: Diagnosis not present

## 2022-04-06 DIAGNOSIS — U071 COVID-19: Secondary | ICD-10-CM

## 2022-04-06 MED ORDER — NIRMATRELVIR/RITONAVIR (PAXLOVID) TABLET (RENAL DOSING): 2.0000 | ORAL_TABLET | Freq: Two times a day (BID) | ORAL | 0 refills | Status: AC

## 2022-04-06 NOTE — Progress Notes (Signed)
Location:  [place] Room Number: [place] of Service:  ALF [phone]) Provider:  Yvonna Alanis, NP   Virgie Dad, MD  Patient Care Team: Virgie Dad, MD as PCP - General (Internal Medicine) Melina Modena, [facility] Crista Luria, MD as Consulting Physician (Dermatology) Marilynne Halsted, MD as Referring Physician (Ophthalmology) Bond, Tracie Harrier, MD as Referring Physician (Ophthalmology) Virgie Dad, MD as Referring Physician (Internal Medicine)  Extended Emergency Contact Information Primary Emergency Contact: Tawny Hopping States of [place] Phone: [phone] Relation: Son  Code Status: DNR Goals of care: Advanced Directive information    03/31/2022   11:34 AM  Advanced Directives  Does Patient Have a Medical Advance Directive? Yes  Type of Paramedic of [place];Living will;Out of facility DNR (pink MOST or yellow form)  Does patient want to make changes to medical advance directive? No - Patient declined  Copy of [place] in Chart? Yes - validated most recent copy scanned in chart (See row information)     Chief Complaint  Patient presents with   Acute Visit    covid    HPI:  Pt is a Russell [name] seen today for acute visit due to positive covid test.   09/16 he tested positive for covid. Symptoms included fatigue, non productive cough and nasal congestion. On call started Paxlovid. Today, he confirms cold symptoms. He has no complaints. He is eating and drinking well per nursing. He was placed on isolation precautions per [facility]. He has completed his initial covid vaccine and 4 boosters.   09/14 WBC 11.4> was 12.0 (09/11)> was 6.3 (03/20). See above.    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   Cyclitis, chronic 11/18/2012   Deafness 10/18/2012   Endothelial corneal dystrophy    Essential hypertension 10/18/2012   Hammer toe 04/24/2014   Hyperlipidemia 10/18/2012   Intermediate stage nonexudative age-related macular degeneration of both eyes 04/16/2016   Nocturia 05/19/2016   Occlusion and stenosis of carotid artery with cerebral infarction    Other specified cardiac dysrhythmias(427.89)    Pain in joint, pelvic region and thigh    Paresthesia 04/30/2015   Toes of both feet    Peripheral vascular disease, unspecified ([place])    Primary open angle glaucoma 09/25/2011   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   Rosacea    Seborrheic keratosis 04/24/2014   Syncope 06/27/2013   TIA (transient ischemic attack) 10/18/2012   States he has had about 5  times   Urine frequency    Past Surgical History:  Procedure Laterality Date   CATARACT EXTRACTION  2005   bilateral   CORNEAL TRANSPLANT  2010 left; 2013 right eye   HERNIA REPAIR  1985   Left   HERNIA REPAIR  03/2004   Right   SHOULDER DEBRIDEMENT Left 2005   TONSILLECTOMY Bilateral childhood   TRANSURETHRAL RESECTION OF PROSTATE N/A 10/2003   VASECTOMY      No Known Allergies  Outpatient Encounter Medications as of 04/06/2022  Medication Sig   amLODipine (NORVASC) 5 MG tablet TAKE 1 TABLET DAILY   ammonium lactate (LAC-HYDRIN) 12 % lotion Apply 1 application. topically daily.   aspirin 325 MG tablet Take  325 mg by mouth every evening.   atorvastatin (LIPITOR) 40 MG tablet TAKE 1 TABLET DAILY   camphor-menthol (SARNA) lotion Apply 1 Application topically 2 (two) times daily as needed for itching.   DORZOLAMIDE HCL-TIMOLOL MAL OP Place 1 drop into both eyes 2 (two) times daily.   enalapril (VASOTEC) 20 MG tablet Take 20 mg by mouth in the morning.   latanoprost (XALATAN) 0.005 % ophthalmic solution Place 1 drop into the left eye daily.   loratadine (CLARITIN) 10 MG  tablet Take 10 mg by mouth daily.   melatonin 3 MG TABS tablet Take 3 mg by mouth at bedtime. 8pm   Multiple Vitamins-Minerals (MULTIVITAMIN ADULTS PO) Take 1 capsule by mouth every morning.   senna-docusate (SENOKOT-S) 8.6-50 MG tablet Take 1 tablet by mouth daily.   sertraline (ZOLOFT) 50 MG tablet Take 50 mg by mouth daily.   No facility-administered encounter medications on file as of 04/06/2022.    Review of Systems  Constitutional:  Positive for fatigue. Negative for activity change, appetite change, chills and fever.  HENT:  Positive for congestion and rhinorrhea. Negative for sore throat and trouble swallowing.   Eyes:  Negative for visual disturbance.  Respiratory:  Positive for cough. Negative for shortness of breath and wheezing.   Cardiovascular:  Negative for chest pain and leg swelling.  Gastrointestinal:  Negative for constipation, diarrhea, nausea and vomiting.  Musculoskeletal:  Positive for gait problem. Negative for myalgias.  Skin:  Negative for wound.  Neurological:  Positive for weakness. Negative for dizziness and headaches.  Psychiatric/Behavioral:  Positive for confusion. Negative for dysphoric mood and sleep disturbance. The patient is not nervous/anxious.     Immunization History  Administered Date(s) Administered   Influenza Whole 05/03/2002, 04/19/2012, 04/20/2013   Influenza, High Dose Seasonal PF 05/03/2019   Influenza,inj,Quad PF,6+ Mos 04/21/2018   Influenza-Unspecified 05/03/2014, 04/18/2015, 04/30/2016, 04/18/2017, 04/09/2020, 05/13/2021   Moderna SARS-COV2 Booster Vaccination 12/25/2020   Moderna Sars-Covid-2 Vaccination 07/24/2019, 08/21/2019, 06/03/2020, 12/31/2021   Pfizer Covid-19 Vaccine Bivalent Booster 54yr & up 04/09/2021   Pneumococcal Conjugate-13 03/20/2010   Pneumococcal Polysaccharide-23 11/02/2017   Td 02/24/2018   Tdap 06/27/2011   Zoster Recombinat (Shingrix) 12/24/2017, 03/09/2018   Zoster, Live 11/30/2006   Pertinent  Health  Maintenance Due  Topic Date Due   INFLUENZA VACCINE  02/17/2022      08/15/2020    9:16 AM 09/04/2020    3:04 PM 12/04/2020    1:31 PM 04/21/2021   10:34 AM 08/25/2021   11:46 AM  Fall Risk  Falls in the past year? 1 0 0  1  Was there an injury with Fall? 0    0  Fall Risk Category Calculator 2    1  Fall Risk Category Moderate    Low  Patient Fall Risk Level Moderate fall risk   Moderate fall risk Moderate fall risk  Patient at Risk for Falls Due to     History of fall(s);Impaired balance/gait;Impaired mobility  Fall risk Follow up     Falls evaluation completed;Education provided   Functional Status Survey:    Vitals:   04/06/22 1150  BP: 124/66  Pulse: 96  Resp: 20  Temp: (!) 96.9 F (36.1 C)  SpO2: 96%  Weight: 159 lb 9.6 oz (72.4 kg)  Height: '5\' 7"'$  (1.702 m)   Body mass index is 25 kg/m. Physical Exam Vitals reviewed.  Constitutional:      General: He is not in acute distress. HENT:  Head: Normocephalic.     Right Ear: There is no impacted cerumen.     Left Ear: There is no impacted cerumen.     Nose: Rhinorrhea present.     Mouth/Throat:     Mouth: Mucous membranes are moist.     Pharynx: No posterior oropharyngeal erythema.  Eyes:     General:        Right eye: No discharge.        Left eye: No discharge.  Cardiovascular:     Rate and Rhythm: Normal rate and regular rhythm.     Pulses: Normal pulses.     Heart sounds: Normal heart sounds.  Pulmonary:     Effort: Pulmonary effort is normal. No respiratory distress.     Breath sounds: Normal breath sounds. No wheezing or rales.  Abdominal:     General: Bowel sounds are normal. There is no distension.     Palpations: Abdomen is soft.     Tenderness: There is no abdominal tenderness.  Musculoskeletal:     Cervical back: Neck supple.     Right lower leg: No edema.     Left lower leg: No edema.  Lymphadenopathy:     Cervical: No cervical adenopathy.  Skin:    General: Skin is warm and dry.      Capillary Refill: Capillary refill takes less than 2 seconds.  Neurological:     General: No focal deficit present.     Mental Status: He is alert. Mental status is at baseline.     Motor: Weakness present.     Gait: Gait abnormal.     Comments: walker  Psychiatric:        Mood and Affect: Mood normal.        Behavior: Behavior normal.     Labs reviewed: Recent Labs    04/10/21 0000 04/21/21 1119 10/06/21 0000  NA 139 138 141  K 4.0 4.1 4.0  CL 106 109 107  CO2 25* 22 29*  GLUCOSE  --  90  --   BUN 25* 26* 28*  CREATININE 1.0 0.93 1.2  CALCIUM 8.7 8.6* 8.5*   Recent Labs    04/07/21 0000 04/10/21 0000 10/06/21 0000  AST '26 24 17  '$ ALT '30 28 19  '$ ALKPHOS 59 57 55  ALBUMIN 4.0 4.0 3.5   Recent Labs    04/10/21 0000 04/21/21 1119 10/06/21 0000  WBC 8.8 8.1 6.3  NEUTROABS 5,562.00 5.9 3,522.00  HGB 14.9 14.5 14.6  HCT 43 42.6 43  MCV  --  91.4  --   PLT 219 192 182   Lab Results  Component Value Date   TSH 2.73 04/07/2021   No results found for: "HGBA1C" Lab Results  Component Value Date   CHOL 126 10/06/2021   HDL 41 10/06/2021   LDLCALC 73 10/06/2021   TRIG 50 10/06/2021   CHOLHDL 2.9 05/09/2020    Significant Diagnostic Results in last 30 days:  No results found.  Assessment/Plan 1. COVID-19 - 09/16 covid positive - symptoms: nasal congestion, cough, fatigue - lung sounds clear, nose with clear drainage - on Paxlovid 09/17-09/22 - hold statin  2. Leukocytosis, unspecified - 09/14 WBC 11.4> was 12.0 (09/11)> was 6.3 (03/20) - see above - no further testing   Family/ staff Communication: plan discussed with patient and nurse  Labs/tests ordered:  none

## 2022-07-01 ENCOUNTER — Encounter: Payer: Self-pay | Admitting: Orthopedic Surgery

## 2022-07-01 ENCOUNTER — Non-Acute Institutional Stay: Payer: Medicare Other | Admitting: Orthopedic Surgery

## 2022-07-01 DIAGNOSIS — E785 Hyperlipidemia, unspecified: Secondary | ICD-10-CM | POA: Diagnosis not present

## 2022-07-01 DIAGNOSIS — I10 Essential (primary) hypertension: Secondary | ICD-10-CM

## 2022-07-01 DIAGNOSIS — G459 Transient cerebral ischemic attack, unspecified: Secondary | ICD-10-CM

## 2022-07-01 DIAGNOSIS — I69919 Unspecified symptoms and signs involving cognitive functions following unspecified cerebrovascular disease: Secondary | ICD-10-CM | POA: Diagnosis not present

## 2022-07-01 DIAGNOSIS — G4700 Insomnia, unspecified: Secondary | ICD-10-CM

## 2022-07-01 DIAGNOSIS — M545 Low back pain, unspecified: Secondary | ICD-10-CM

## 2022-07-01 DIAGNOSIS — F339 Major depressive disorder, recurrent, unspecified: Secondary | ICD-10-CM

## 2022-07-01 DIAGNOSIS — L719 Rosacea, unspecified: Secondary | ICD-10-CM

## 2022-07-01 DIAGNOSIS — H353132 Nonexudative age-related macular degeneration, bilateral, intermediate dry stage: Secondary | ICD-10-CM

## 2022-07-01 DIAGNOSIS — G8929 Other chronic pain: Secondary | ICD-10-CM

## 2022-07-01 NOTE — Progress Notes (Signed)
Location:  [place] Room Number: [place] of Service:  ALF [phone]) Provider:  Yvonna Alanis, NP   Virgie Dad, MD  Patient Care Team: Virgie Dad, MD as PCP - General (Internal Medicine) Melina Modena, [facility] Crista Luria, MD as Consulting Physician (Dermatology) Marilynne Halsted, MD as Referring Physician (Ophthalmology) Bond, Tracie Harrier, MD as Referring Physician (Ophthalmology) Virgie Dad, MD as Referring Physician (Internal Medicine)  Extended Emergency Contact Information Primary Emergency Contact: Tawny Hopping States of [place] Phone: [phone] Relation: Son  Code Status:  DNR Goals of care: Advanced Directive information    03/31/2022   11:34 AM  Advanced Directives  Does Patient Have a Medical Advance Directive? Yes  Type of Paramedic of [place];Living will;Out of facility DNR (pink MOST or yellow form)  Does patient want to make changes to medical advance directive? No - Patient declined  Copy of [place] in Chart? Yes - validated most recent copy scanned in chart (See row information)     Chief Complaint  Patient presents with   Medical Management of Chronic Issues    Routine Visit with the provider on site at [facility].   Quality Metric Gaps    Needs to Discuss Medicare Annual Wellness    HPI:  Pt is a 88 y.o. male seen today for medical management of chronic diseases.    He currently resides on the assisted living unit at [facility]. PMH: HTN, HLD, TIA, mixed hearing loss, actinic keratosis, glaucoma, joint pain, and depression.   Cognitive deficit- MMSE 27/30 03/2021, BIMS score 5/15 (05/19/2022), MRI brain 12/2017 revealed age related cerebral atrophy with moderate to advanced chronic small vessel ischemic disease, ambulates with walker, no behaviors Chronic back pain-  lower back pain when changing positions- especially laying to sitting  position, tylenol prn started HTN- BUN/creat 26/1.08 03/30/2022, remains on enalapril and amlodipine, see trends below HLD- LDL 78 03/30/2022, remains on Lipitor TIA- last episode reported 10/18/2012, remains on asa and statin Insomnia- remains on melatonin Depression- unsuccessful trial of Wellbutrin (increased anxiety), Na+ 141 03/30/2022, remains on Zoloft Dry skin/Rosacea- remains on ammonium lactate and Sarna lotion Macular degeneration- denies ocular pain, remains on AREDS vitamin  Recent blood pressures:  12/06- 148/88  11/29- 147/85  11/22- 162/86  Recent weights:  12/01- 158.8 lbs  11/03- 157.6 lbs  10/04- 158.4 lbs    Past Surgical History:  Procedure Laterality Date   CATARACT EXTRACTION  2005   bilateral   CORNEAL TRANSPLANT  2010 left; 2013 right eye   HERNIA REPAIR  1985   Left   HERNIA REPAIR  03/2004   Right   SHOULDER DEBRIDEMENT Left 2005   TONSILLECTOMY Bilateral childhood   TRANSURETHRAL RESECTION OF PROSTATE N/A 10/2003   VASECTOMY      No Known Allergies  Outpatient Encounter Medications as of 07/01/2022  Medication Sig   amLODipine (NORVASC) 5 MG tablet TAKE 1 TABLET DAILY   ammonium lactate (LAC-HYDRIN) 12 % lotion Apply 1 application. topically daily.   aspirin 325 MG tablet Take 325 mg by mouth every evening.   atorvastatin (LIPITOR) 40 MG tablet TAKE 1 TABLET DAILY   camphor-menthol (SARNA) lotion Apply 1 Application topically 2 (two) times daily as needed for itching.   DORZOLAMIDE HCL-TIMOLOL MAL OP Place 1 drop into both eyes 2 (two) times daily.   enalapril (VASOTEC) 20 MG tablet Take 20 mg by mouth in the morning.  latanoprost (XALATAN) 0.005 % ophthalmic solution Place 1 drop into the left eye daily.   loratadine (CLARITIN) 10 MG tablet Take 10 mg by mouth daily.   melatonin 3 MG TABS tablet Take 3 mg by mouth at bedtime. 8pm   Multiple Vitamins-Minerals (MULTIVITAMIN ADULTS PO) Take 1 capsule by mouth every morning.   senna-docusate  (SENOKOT-S) 8.6-50 MG tablet Take 1 tablet by mouth daily.   sertraline (ZOLOFT) 50 MG tablet Take 50 mg by mouth daily.   No facility-administered encounter medications on file as of 07/01/2022.    Review of Systems  Unable to perform ROS: Other    Immunization History  Administered Date(s) Administered   Influenza Whole 05/03/2002, 04/19/2012, 04/20/2013   Influenza, High Dose Seasonal PF 05/03/2019, 05/13/2022   Influenza,inj,Quad PF,6+ Mos 04/21/2018   Influenza-Unspecified 05/03/2014, 04/18/2015, 04/30/2016, 04/18/2017, 04/09/2020, 05/13/2021   Moderna SARS-COV2 Booster Vaccination 12/25/2020   Moderna Sars-Covid-2 Vaccination 07/24/2019, 08/21/2019, 06/03/2020, 12/31/2021, 05/26/2022   Pfizer Covid-19 Vaccine Bivalent Booster 9yr & up 04/09/2021   Pneumococcal Conjugate-13 03/20/2010   Pneumococcal Polysaccharide-23 11/02/2017   Td 02/24/2018   Tdap 06/27/2011   Zoster Recombinat (Shingrix) 12/24/2017, 03/09/2018   Zoster, Live 11/30/2006   Pertinent  Health Maintenance Due  Topic Date Due   INFLUENZA VACCINE  Completed      08/15/2020    9:16 AM 09/04/2020    3:04 PM 12/04/2020    1:31 PM 04/21/2021   10:34 AM 08/25/2021   11:46 AM  Fall Risk  Falls in the past year? 1 0 0  1  Was there an injury with Fall? 0    0  Fall Risk Category Calculator 2    1  Fall Risk Category Moderate    Low  Patient Fall Risk Level Moderate fall risk   Moderate fall risk Moderate fall risk  Patient at Risk for Falls Due to     History of fall(s);Impaired balance/gait;Impaired mobility  Fall risk Follow up     Falls evaluation completed;Education provided   Functional Status Survey:    Vitals:   07/01/22 1148  BP: (!) 148/88  Pulse: 92  Resp: 18  Temp: 98.3 F (36.8 C)  SpO2: 97%  Weight: 158 lb 12.8 oz (72 kg)  Height: '5\' 7"'$  (1.702 m)   Body mass index is 24.87 kg/m. Physical Exam Vitals reviewed.  Constitutional:      General: He is not in acute distress. HENT:      Head: Normocephalic.     Right Ear: There is no impacted cerumen.     Left Ear: There is no impacted cerumen.     Nose: Nose normal.     Mouth/Throat:     Mouth: Mucous membranes are moist.  Eyes:     General:        Right eye: No discharge.        Left eye: No discharge.  Cardiovascular:     Rate and Rhythm: Normal rate and regular rhythm.     Pulses: Normal pulses.     Heart sounds: Normal heart sounds.  Pulmonary:     Effort: Pulmonary effort is normal. No respiratory distress.     Breath sounds: Normal breath sounds. No wheezing.  Abdominal:     General: Bowel sounds are normal. There is no distension.     Palpations: Abdomen is soft.     Tenderness: There is no abdominal tenderness.  Musculoskeletal:     Cervical back: Neck supple.     Right  lower leg: No edema.     Left lower leg: No edema.  Skin:    General: Skin is warm.     Capillary Refill: Capillary refill takes less than 2 seconds.  Neurological:     General: No focal deficit present.     Mental Status: He is alert. Mental status is at baseline.     Motor: Weakness present.     Gait: Gait abnormal.     Comments: walker  Psychiatric:        Mood and Affect: Mood normal.        Behavior: Behavior normal.     Comments: Very pleasant, follows commands, able to express needs, alert to self/familiar face/place     Labs reviewed: Recent Labs    10/06/21 0000  NA 141  K 4.0  CL 107  CO2 29*  BUN 28*  CREATININE 1.2  CALCIUM 8.5*   Recent Labs    10/06/21 0000  AST 17  ALT 19  ALKPHOS 55  ALBUMIN 3.5   Recent Labs    10/06/21 0000  WBC 6.3  NEUTROABS 3,522.00  HGB 14.6  HCT 43  PLT 182   Lab Results  Component Value Date   TSH 2.73 04/07/2021   No results found for: "HGBA1C" Lab Results  Component Value Date   CHOL 126 10/06/2021   HDL 41 10/06/2021   LDLCALC 73 10/06/2021   TRIG 50 10/06/2021   CHOLHDL 2.9 05/09/2020    Significant Diagnostic Results in last 30 days:  No results  found.  Assessment/Plan 1. 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 Chronic midline low back pain without sciatica - increased pain when laying to sitting position - no recent fall/injury - cont tylenol prn  3. Essential hypertension - controlled - cont amlodipine and enalapril  4. Hyperlipidemia, unspecified hyperlipidemia type - LDL 98 03/30/2022 - cont statin  5. TIA (transient ischemic attack) - cont full dose asa and statin  6. Insomnia, unspecified type - sleeping well with melatonin qhs  7. Depression, recurrent ([place]) - no mood changes - cont Zoloft  8. Rosacea - cont ammonium lactate lotion  9. Intermediate stage nonexudative age-related macular degeneration of both eyes - cont AREDS    Family/ staff Communication: plan discussed with patient and nurse  Labs/tests ordered:  none

## 2022-07-29 ENCOUNTER — Non-Acute Institutional Stay: Payer: Medicare Other | Admitting: Orthopedic Surgery

## 2022-07-29 ENCOUNTER — Encounter: Payer: Self-pay | Admitting: Orthopedic Surgery

## 2022-07-29 DIAGNOSIS — I69919 Unspecified symptoms and signs involving cognitive functions following unspecified cerebrovascular disease: Secondary | ICD-10-CM

## 2022-07-29 DIAGNOSIS — H5702 Anisocoria: Secondary | ICD-10-CM

## 2022-07-29 NOTE — Progress Notes (Signed)
Location:  [place] Room Number: [place] of Service:  ALF [phone]) Provider:  Windell Moulding, NP   Patient Care Team: Virgie Dad, MD as PCP - General (Internal Medicine) Melina Modena, [facility] Crista Luria, MD as Consulting Physician (Dermatology) Marilynne Halsted, MD as Referring Physician (Ophthalmology) Bond, Tracie Harrier, MD as Referring Physician (Ophthalmology) Virgie Dad, MD as Referring Physician (Internal Medicine)  Extended Emergency Contact Information Primary Emergency Contact: Tawny Hopping States of [place] Phone: [phone] Relation: Son  Code Status:  DNR Goals of care: Advanced Directive information    07/29/2022    9:42 AM  Advanced Directives  Does Patient Have a Medical Advance Directive? Yes  Type of Advance Directive Living will;Healthcare Power of [place];Out of facility DNR (pink MOST or yellow form)  Does patient want to make changes to medical advance directive? No - Patient declined  Copy of [place] in Chart? Yes - validated most recent copy scanned in chart (See row information)     Chief Complaint  Patient presents with   Acute Visit    Left Eye Swelling    HPI:  Pt is a 89 y.o. male seen today for acute visit due to left eye swelling.   He currently resides on the assisted living unit at [facility]. PMH: HTN, HLD, TIA, mixed hearing loss, actinic keratosis, glaucoma, macular degeneration,  joint pain, and depression.    01/09 evening nurse reported left eye dilation more than right. He admitted to some blurred vision at that time. Denied eye pain, photosensitivity, or floaters. H/o glaucoma and macular degeneration. He did have visual impairment in past, Neurology work up negative. This morning he denies changes to vision or eye pain. Left eye is dilated more than right. Day nurse reports he has had this since moving to assisted living.   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/18/2012   Deafness 10/18/2012   Endothelial corneal dystrophy    Essential hypertension 10/18/2012   Hammer toe 04/24/2014   Hyperlipidemia 10/18/2012   Intermediate stage nonexudative age-related macular degeneration of both eyes 04/16/2016   Nocturia 05/19/2016   Occlusion and stenosis of carotid artery with cerebral infarction    Other specified cardiac dysrhythmias(427.89)    Pain in joint, pelvic region and thigh    Paresthesia 04/30/2015   Toes of both feet    Peripheral vascular disease, unspecified ([place])    Primary open angle glaucoma 09/25/2011   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   Rosacea    Seborrheic keratosis 04/24/2014   Syncope 06/27/2013   TIA (transient ischemic attack) 10/18/2012   States he has had about 5  times   Urine frequency    Past Surgical History:  Procedure Laterality Date   CATARACT EXTRACTION  2005   bilateral   CORNEAL TRANSPLANT  2010 left; 2013 right eye   HERNIA REPAIR  1985   Left   HERNIA REPAIR  03/2004   Right   SHOULDER DEBRIDEMENT Left 2005  TONSILLECTOMY Bilateral childhood   TRANSURETHRAL RESECTION OF PROSTATE N/A 10/2003   VASECTOMY      No Known Allergies  Outpatient Encounter Medications as of 07/29/2022  Medication Sig   acetaminophen (TYLENOL) 500 MG tablet Take 1,000 mg by mouth 3 (three) times daily as needed for mild pain or moderate pain.   amLODipine (NORVASC) 5 MG tablet TAKE 1 TABLET DAILY   ammonium lactate (LAC-HYDRIN) 12 %  lotion Apply 1 application. topically daily.   aspirin 325 MG tablet Take 325 mg by mouth every evening.   atorvastatin (LIPITOR) 40 MG tablet TAKE 1 TABLET DAILY   camphor-menthol (SARNA) lotion Apply 1 Application topically 2 (two) times daily as needed for itching.   DORZOLAMIDE HCL-TIMOLOL MAL OP Place 1 drop into both eyes 2 (two) times daily.   enalapril (VASOTEC) 20 MG tablet Take 20 mg by mouth in the morning.   lactose free nutrition (BOOST) LIQD Take 237 mLs by mouth daily.   latanoprost (XALATAN) 0.005 % ophthalmic solution Place 1 drop into the left eye daily.   loratadine (CLARITIN) 10 MG tablet Take 10 mg by mouth daily.   melatonin 3 MG TABS tablet Take 3 mg by mouth at bedtime. 8pm   Multiple Vitamins-Minerals (MULTIVITAMIN ADULTS PO) Take 1 capsule by mouth every morning.   senna-docusate (SENOKOT-S) 8.6-50 MG tablet Take 1 tablet by mouth daily.   sertraline (ZOLOFT) 50 MG tablet Take 50 mg by mouth daily.   zinc oxide 20 % ointment Apply 1 Application topically as needed for irritation.   No facility-administered encounter medications on file as of 07/29/2022.    Review of Systems  Unable to perform ROS: Dementia    Immunization History  Administered Date(s) Administered   Influenza Whole 05/03/2002, 04/19/2012, 04/20/2013   Influenza, High Dose Seasonal PF 05/03/2019, 05/13/2022   Influenza,inj,Quad PF,6+ Mos 04/21/2018   Influenza-Unspecified 05/03/2014, 04/18/2015, 04/30/2016, 04/18/2017, 04/09/2020, 05/13/2021   Moderna SARS-COV2 Booster Vaccination 12/25/2020   Moderna Sars-Covid-2 Vaccination 07/24/2019, 08/21/2019, 06/03/2020, 12/31/2021, 05/26/2022   Pfizer Covid-19 Vaccine Bivalent Booster 60yr & up 04/09/2021   Pneumococcal Conjugate-13 03/20/2010   Pneumococcal Polysaccharide-23 11/02/2017   Td 02/24/2018   Tdap 06/27/2011   Zoster Recombinat (Shingrix) 12/24/2017, 03/09/2018   Zoster, Live 11/30/2006   Pertinent  Health Maintenance Due  Topic Date  Due   INFLUENZA VACCINE  Completed      12/04/2020    1:31 PM 04/21/2021   10:34 AM 08/25/2021   11:46 AM 07/01/2022   11:58 AM 07/29/2022    9:40 AM  Fall Risk  Falls in the past year? 0  1 1 0  Was there an injury with Fall?   0 0 0  Fall Risk Category Calculator   1 1 0  Fall Risk Category   Low Low Low  Patient Fall Risk Level  Moderate fall risk Moderate fall risk Moderate fall risk Moderate fall risk  Patient at Risk for Falls Due to   History of fall(s);Impaired balance/gait;Impaired mobility History of fall(s);Impaired balance/gait;Impaired mobility History of fall(s);Impaired balance/gait;Impaired mobility  Fall risk Follow up   Falls evaluation completed;Education provided Falls evaluation completed Falls evaluation completed   Functional Status Survey:    Vitals:   07/29/22 0927  BP: 138/70  Pulse: 66  Resp: 16  Temp: 98.2 F (36.8 C)  SpO2: 96%  Weight: 160 lb 4 oz (72.7 kg)  Height: '5\' 7"'$  (1.702 m)   Body mass index is 25.1 kg/m. Physical Exam Vitals reviewed.  Constitutional:  General: He is not in acute distress. HENT:     Head: Normocephalic.  Eyes:     General: Lids are normal.        Right eye: No foreign body or discharge.        Left eye: No foreign body or discharge.     Extraocular Movements: Extraocular movements intact.     Right eye: Normal extraocular motion and no nystagmus.     Left eye: Normal extraocular motion and no nystagmus.     Conjunctiva/sclera:     Right eye: No hemorrhage.    Left eye: No hemorrhage.    Pupils: Pupils are equal, round, and reactive to light.     Comments: Left eye dilation 4 mm, right eye dilation 2 mm, no perioribital edema  Cardiovascular:     Rate and Rhythm: Normal rate and regular rhythm.     Pulses: Normal pulses.     Heart sounds: Normal heart sounds.  Pulmonary:     Effort: Pulmonary effort is normal. No respiratory distress.     Breath sounds: Normal breath sounds. No wheezing.  Abdominal:      General: Bowel sounds are normal. There is no distension.     Palpations: Abdomen is soft.     Tenderness: There is no abdominal tenderness.  Musculoskeletal:     Cervical back: Neck supple.     Right lower leg: No edema.     Left lower leg: No edema.  Skin:    General: Skin is warm and dry.     Capillary Refill: Capillary refill takes less than 2 seconds.  Neurological:     General: No focal deficit present.     Mental Status: He is alert. Mental status is at baseline.     Motor: Weakness present.     Gait: Gait abnormal.     Comments: walker  Psychiatric:        Mood and Affect: Mood normal.     Comments: Very pleasant, follows commands, able to express needs, slowed speech, alert to self/familiar face     Labs reviewed: Recent Labs    10/06/21 0000  NA 141  K 4.0  CL 107  CO2 29*  BUN 28*  CREATININE 1.2  CALCIUM 8.5*   Recent Labs    10/06/21 0000  AST 17  ALT 19  ALKPHOS 55  ALBUMIN 3.5   Recent Labs    10/06/21 0000  WBC 6.3  NEUTROABS 3,522.00  HGB 14.6  HCT 43  PLT 182   Lab Results  Component Value Date   TSH 2.73 04/07/2021   No results found for: "HGBA1C" Lab Results  Component Value Date   CHOL 126 10/06/2021   HDL 41 10/06/2021   LDLCALC 73 10/06/2021   TRIG 50 10/06/2021   CHOLHDL 2.9 05/09/2020    Significant Diagnostic Results in last 30 days:  No results found.  Assessment/Plan: 1. Anisocoria - Left pupil 4 mm, right 2 mm/ PERRLA, no periorbital edema, some blurred vision> normal for him  - suspect related to CVA/ cognitive deficit - asymptomatic - followed by Dr. Cordelia Pen left pupil irregular shape 07/2021 - no additional work up at this time  2. 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

## 2022-08-27 ENCOUNTER — Encounter: Payer: Medicare Other | Admitting: Nurse Practitioner

## 2022-09-21 ENCOUNTER — Encounter: Payer: Self-pay | Admitting: Orthopedic Surgery

## 2022-09-21 ENCOUNTER — Non-Acute Institutional Stay (INDEPENDENT_AMBULATORY_CARE_PROVIDER_SITE_OTHER): Payer: Medicare Other | Admitting: Orthopedic Surgery

## 2022-09-21 DIAGNOSIS — Z0000 Encounter for general adult medical examination without abnormal findings: Secondary | ICD-10-CM

## 2022-09-21 NOTE — Progress Notes (Signed)
Subjective:   Russell [name] is a 89 y.o. male who presents for Medicare Annual/Subsequent preventive examination.  Place of [place]- assisted living Provider: Windell Moulding, AGNP-C   Review of Systems     Cardiac Risk Factors include: advanced age (>31mn, >>80women);hypertension;male gender;sedentary lifestyle     Objective:    Today's Vitals   09/21/22 1459 09/21/22 1501  BP: 133/80   Pulse: 85   Resp: 20   Temp: 98.2 F (36.8 C)   SpO2: 96%   Weight: 163 lb 6.4 oz (74.1 kg)   Height: '5\' 7"'$  (1.702 m)   PainSc:  0-No pain   Body mass index is 25.59 kg/m.     07/29/2022    9:42 AM 03/31/2022   11:34 AM 03/26/2022    1:42 PM 01/07/2022   10:18 AM 10/21/2021   10:37 AM 10/02/2021    2:46 PM 08/25/2021    9:45 AM  Advanced Directives  Does Patient Have a Medical Advance Directive? Yes Yes Yes Yes Yes Yes Yes  Type of Advance Directive Living will;Healthcare Power of Attorney;Out of facility DNR (pink MOST or yellow form) [place];Out of facility DNR (pink MOST or yellow form) [place] will;Out of facility DNR (pink MOST or yellow form) H[place]Living will;Out of facility DNR (pink MOST or yellow form) [place] will;Out of facility DNR (pink MOST or yellow form) [place]Living will;Out of facility DNR (pink MOST or yellow form) [place]Living will;Out of facility DNR (pink MOST or yellow form)  Does patient want to make changes to medical advance directive? No - Patient declined No - Patient declined No - Patient declined No - Patient declined No - Patient declined No - Patient declined No - Patient declined  Copy of H[place] Chart? Yes - validated most recent copy scanned in chart (See row information) Yes - validated most recent copy scanned in chart (See row information) Yes - validated most recent  copy scanned in chart (See row information) Yes - validated most recent copy scanned in chart (See row information) Yes - validated most recent copy scanned in chart (See row information) Yes - validated most recent copy scanned in chart (See row information) Yes - validated most recent copy scanned in chart (See row information)  Pre-existing out of facility DNR order (yellow form or pink MOST form)    Yellow form placed in chart (order not valid for inpatient use)       Current Medications (verified) Outpatient Encounter Medications as of 09/21/2022  Medication Sig   acetaminophen (TYLENOL) 500 MG tablet Take 1,000 mg by mouth 3 (three) times daily as needed for mild pain or moderate pain.   amLODipine (NORVASC) 5 MG tablet TAKE 1 TABLET DAILY   ammonium lactate (LAC-HYDRIN) 12 % lotion Apply 1 application. topically daily.   aspirin 325 MG tablet Take 325 mg by mouth every evening.   atorvastatin (LIPITOR) 40 MG tablet TAKE 1 TABLET DAILY   camphor-menthol (SARNA) lotion Apply 1 Application topically 2 (two) times daily as needed for itching.   DORZOLAMIDE HCL-TIMOLOL MAL OP Place 1 drop into both eyes 2 (two) times daily.   enalapril (VASOTEC) 20 MG tablet Take 20 mg by mouth in the morning.   lactose free nutrition (BOOST) LIQD Take 237 mLs by mouth daily.   latanoprost (XALATAN) 0.005 % ophthalmic solution Place 1 drop into the left  eye daily.   loratadine (CLARITIN) 10 MG tablet Take 10 mg by mouth daily.   melatonin 3 MG TABS tablet Take 3 mg by mouth at bedtime. 8pm   Multiple Vitamins-Minerals (MULTIVITAMIN ADULTS PO) Take 1 capsule by mouth every morning.   senna-docusate (SENOKOT-S) 8.6-50 MG tablet Take 1 tablet by mouth daily.   sertraline (ZOLOFT) 50 MG tablet Take 50 mg by mouth daily.   zinc oxide 20 % ointment Apply 1 Application topically as needed for irritation.   No facility-administered encounter medications on file as of 09/21/2022.    Allergies (verified) Patient has  no known allergies.   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/18/2012   Deafness 10/18/2012   Endothelial corneal dystrophy    Essential hypertension 10/18/2012   Hammer toe 04/24/2014   Hyperlipidemia 10/18/2012   Intermediate stage nonexudative age-related macular degeneration of both eyes 04/16/2016   Nocturia 05/19/2016   Occlusion and stenosis of carotid artery with cerebral infarction    Other specified cardiac dysrhythmias(427.89)    Pain in joint, pelvic region and thigh    Paresthesia 04/30/2015   Toes of both feet    Peripheral vascular disease, unspecified ([place])    Primary open angle glaucoma 09/25/2011   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   Rosacea    Seborrheic keratosis 04/24/2014   Syncope 06/27/2013   TIA (transient ischemic attack) 10/18/2012   States he has had about 5  times   Urine frequency    Past Surgical History:  Procedure Laterality Date   CATARACT EXTRACTION  2005   bilateral   CORNEAL TRANSPLANT  2010 left; 2013 right eye   [place]   Left   HERNIA REPAIR  03/2004   Right   SHOULDER DEBRIDEMENT Left 2005   TONSILLECTOMY Bilateral childhood   TRANSURETHRAL RESECTION OF PROSTATE N/A 10/2003   VASECTOMY     Family History  Problem Relation Age of Onset   Cancer Mother    Heart disease Father    Social History   Socioeconomic History   Marital status: Widowed    Spouse name: Not on file   Number of children: Not on file   Years of education: Not on file   Highest education level: Not on file  Occupational History   Occupation: retired Insurance claims handler  Tobacco Use   Smoking status: Former    Packs/day: 1.00    Years: 25.00    Total pack years: 25.00     Types: Cigarettes    Quit date: 10/18/1964    Years since quitting: 57.9   Smokeless tobacco: Never   Tobacco comments:    quit 1966  Vaping Use   Vaping Use: Never used  Substance and Sexual Activity   Alcohol use: No    Alcohol/week: 1.0 standard drink of alcohol    Types: 1 Glasses of wine per week    Comment: very seldom   Drug use: No   Sexual activity: Not on file  Other Topics Concern   Not on file  Social History Narrative   Lives at [facility] since 08/21/2011   Married to [place]   Has Living Will   Exercise: 3 x week bicycle  Previously smoked stopped 1966   Does not drink cafferinated beverage   Drinks one small glass of wine few nights a week    Social Determinants of Health   Financial Resource Strain: Low Risk  (09/21/2022)   Overall Financial Resource Strain (CARDIA)    Difficulty of Paying Living Expenses: Not hard at all  Food Insecurity: No Food Insecurity (09/21/2022)   Hunger Vital Sign    Worried About Running Out of Food in the Last Year: Never true    Ran Out of Food in the Last Year: Never true  Transportation Needs: No Transportation Needs (09/21/2022)   PRAPARE - Hydrologist (Medical): No    Lack of Transportation (Non-Medical): No  Physical Activity: Insufficiently Active (09/21/2022)   Exercise Vital Sign    Days of Exercise per Week: 5 days    Minutes of Exercise per Session: 10 min  Stress: No Stress Concern Present (09/21/2022)   [place]    Feeling of Stress : Not at all  Social Connections: Moderately Isolated (09/21/2022)   Social Connection and Isolation Panel [NHANES]    Frequency of Communication with Friends and Family: More than three times a week    Frequency of Social Gatherings with Friends and Family: More than three times a week    Attends Religious Services: 1 to 4 times per year    Active Member of [employer] or Organizations: No     Attends Archivist Meetings: Never    Marital Status: Widowed    Tobacco Counseling Counseling given: Not Answered Tobacco comments: quit 1966   Clinical Intake:  Pre-visit preparation completed: No  Pain : No/denies pain Pain Score: 0-No pain     BMI - recorded: 25.59 Nutritional Status: BMI 25 -29 Overweight Nutritional Risks: None Diabetes: No  How often do you need to have someone help you when you read instructions, pamphlets, or other written materials from your doctor or pharmacy?: 4 - Often What is the last grade level you completed in school?: College  Diabetic?No  Interpreter Needed?: No      Activities of Daily Living    09/21/2022    3:05 PM  In your present state of health, do you have any difficulty performing the following activities:  Hearing? 1  Vision? 0  Difficulty concentrating or making decisions? 1  Walking or climbing stairs? 1  Dressing or bathing? 1  Doing errands, shopping? 1  Preparing Food and eating ? Y  In the past six months, have you accidently leaked urine? Y  Do you have problems with loss of bowel control? N  Managing your Medications? Y  Managing your Finances? Y  Housekeeping or managing your Housekeeping? Y    Patient Care Team: Virgie Dad, MD as PCP - General (Internal Medicine) [place], [facility] Crista Luria, MD as Consulting Physician (Dermatology) Marilynne Halsted, MD as Referring Physician (Ophthalmology) Bond, Tracie Harrier, MD as Referring Physician (Ophthalmology) Virgie Dad, MD as Referring Physician (Internal Medicine)  Indicate any recent Medical Services you may have received from other than [facility] providers in the past year (date may be approximate).     Assessment:   This is a routine wellness examination for [place].  Hearing/Vision screen No results found.  Dietary issues and exercise activities discussed: Current Exercise Habits: The patient does not participate in regular  exercise at present, Exercise limited by: neurologic condition(s);cardiac condition(s)   Goals Addressed  This Visit's Progress    Maintain Mobility and Function   On track    Evidence-based guidance:  Acknowledge and validate impact of pain, loss of strength and potential disfigurement (hand osteoarthritis) on mental health and daily life, such as social isolation, anxiety, depression, impaired sexual relationship and   injury from falls.  Anticipate referral to physical or occupational therapy for assessment, therapeutic exercise and recommendation for adaptive equipment or assistive devices; encourage participation.  Assess impact on ability to perform activities of daily living, as well as engage in sports and leisure events or requirements of work or school.  Provide anticipatory guidance and reassurance about the benefit of exercise to maintain function; acknowledge and normalize fear that exercise may worsen symptoms.  Encourage regular exercise, at least 10 minutes at a time for 45 minutes per week; consider yoga, water exercise and proprioceptive exercises; encourage use of wearable activity tracker to increase motivation and adherence.  Encourage maintenance or resumption of daily activities, including employment, as pain allows and with minimal exposure to trauma.  Assist patient to advocate for adaptations to the work environment.  Consider level of pain and function, gender, age, lifestyle, patient preference, quality of life, readiness and ?ocapacity to benefit? when recommending patients for orthopaedic surgery consultation.  Explore strategies, such as changes to medication regimen or activity that enables patient to anticipate and manage flare-ups that increase deconditioning and disability.  Explore patient preferences; encourage exposure to a broader range of activities that have been avoided for fear of experiencing pain.  Identify barriers to participation in  therapy or exercise, such as pain with activity, anticipated or imagined pain.  Monitor postoperative joint replacement or any preexisting joint replacement for ongoing pain and loss of function; provide social support and encouragement throughout recovery.   Notes:      Patient Stated   Not on track    To get back into routine exercise        Depression Screen    09/21/2022    3:05 PM 07/29/2022    9:40 AM 07/01/2022   12:00 PM 08/25/2021   11:41 AM 08/15/2020    9:13 AM 06/29/2018    2:33 PM 07/05/2017    3:35 PM  PHQ 2/9 Scores  PHQ - 2 Score 0 0 0 0 0 0 0    Fall Risk    09/21/2022    3:05 PM 07/29/2022    9:40 AM 07/01/2022   11:58 AM 08/25/2021   11:46 AM 12/04/2020    1:31 PM  Fall Risk   Falls in the past year? 1 0 1 1 0  Number falls in past yr: 0 0 0 0 0  Injury with Fall? 0 0 0 0   Risk for fall due to : History of fall(s);Impaired balance/gait;Impaired mobility History of fall(s);Impaired balance/gait;Impaired mobility History of fall(s);Impaired balance/gait;Impaired mobility History of fall(s);Impaired balance/gait;Impaired mobility   Follow up Falls evaluation completed;Falls prevention discussed;Education provided Falls evaluation completed Falls evaluation completed Falls evaluation completed;Education provided     FALL RISK PREVENTION PERTAINING TO THE HOME:  Any stairs in or around the home? No  If so, are there any without handrails? No  Home free of loose throw rugs in walkways, pet beds, electrical cords, etc? Yes  Adequate lighting in your home to reduce risk of falls? Yes   ASSISTIVE DEVICES UTILIZED TO PREVENT FALLS:  Life alert? No  Use of a cane, walker or w/c? Yes  Grab bars in the bathroom? Yes  Shower chair or bench in shower? Yes  Elevated toilet seat or a handicapped toilet? Yes   TIMED UP AND GO:  Was the test performed? No .  Length of time to ambulate 10 feet: N/A sec.   Gait slow and steady with assistive device  Cognitive  Function:    08/25/2021   12:17 PM 09/13/2019    2:42 PM 05/03/2018    2:23 PM 05/03/2017    1:58 PM 05/19/2016    9:10 AM  MMSE - Mini Mental State Exam  Orientation to time '5 5 5 5 5  '$ Orientation to Place '5 5 5 5 5  '$ Registration '3 3 3 3 3  '$ Attention/ Calculation '4 5 4 5 5  '$ Recall '3 3 3 3 3  '$ Language- name 2 objects '1 2 2 2 2  '$ Language- repeat 0 '1 1 1 1  '$ Language- follow 3 step command '2 3 3 3 3  '$ Language- read & follow direction '1 1 1 1 1  '$ Write a sentence '1 1 1 1 1  '$ Copy design 1 1 0 1 1  Total score '26 30 28 30 30        '$ 08/25/2021   12:18 PM 08/15/2020    9:17 AM  6CIT Screen  What Year? 0 points 0 points  What month? 0 points 0 points  What time? 0 points 0 points  Count back from 20 0 points 0 points  Months in reverse 4 points 4 points  Repeat phrase 0 points 0 points  Total Score 4 points 4 points    Immunizations Immunization History  Administered Date(s) Administered   Influenza Whole 05/03/2002, 04/19/2012, 04/20/2013   Influenza, High Dose Seasonal PF 05/03/2019, 05/13/2022   Influenza,inj,Quad PF,6+ Mos 04/21/2018   Influenza-Unspecified 05/03/2014, 04/18/2015, 04/30/2016, 04/18/2017, 04/09/2020, 05/13/2021   Moderna SARS-COV2 Booster Vaccination 12/25/2020   Moderna Sars-Covid-2 Vaccination 07/24/2019, 08/21/2019, 06/03/2020, 12/31/2021, 05/26/2022   Pfizer Covid-19 Vaccine Bivalent Booster 68yr & up 04/09/2021   Pneumococcal Conjugate-13 03/20/2010   Pneumococcal Polysaccharide-23 11/02/2017   Td 02/24/2018   Tdap 06/27/2011   Zoster Recombinat (Shingrix) 12/24/2017, 03/09/2018   Zoster, Live 11/30/2006    TDAP status: Up to date  Flu Vaccine status: Up to date  Pneumococcal vaccine status: Up to date  Covid-19 vaccine status: Completed vaccines  Qualifies for Shingles Vaccine? Yes   Zostavax completed Yes   Shingrix Completed?: Yes  Screening Tests Health Maintenance  Topic Date Due   COVID-19 Vaccine (7 - 2023-24 season) 07/21/2022    Medicare Annual Wellness (AWV)  09/21/2023   DTaP/Tdap/Td (3 - Td or Tdap) 02/25/2028   Pneumonia Vaccine 89 Years old  Completed   INFLUENZA VACCINE  Completed   Zoster Vaccines- Shingrix  Completed   HPV VACCINES  Aged Out    Health Maintenance  Health Maintenance Due  Topic Date Due   COVID-19 Vaccine (7 - 2023-24 season) 07/21/2022    Colorectal cancer screening: No longer required.   Lung Cancer Screening: (Low Dose CT Chest recommended if Age 87-80years, 30 pack-year currently smoking OR have quit w/in 15years.) does not qualify.   Lung Cancer Screening Referral: No  Additional Screening:  Hepatitis C Screening: does not qualify; Completed   Vision Screening: Recommended annual ophthalmology exams for early detection of glaucoma and other disorders of the eye. Is the patient up to date with their annual eye exam?  Yes  Who is the provider or what is the name of the office in which the  patient attends annual eye exams? [facility]> Dr. Cordelia Pen If pt is not established with a provider, would they like to be referred to a provider to establish care? No .   Dental Screening: Recommended annual dental exams for proper oral hygiene  Community Resource Referral / Chronic Care Management: CRR required this visit?  No   CCM required this visit?  No      Plan:     I have personally reviewed and noted the following in the patient's chart:   Medical and social history Use of alcohol, tobacco or illicit drugs  Current medications and supplements including opioid prescriptions. Patient is not currently taking opioid prescriptions. Functional ability and status Nutritional status Physical activity Advanced directives List of other physicians Hospitalizations, surgeries, and ER visits in previous 12 months Vitals Screenings to include cognitive, depression, and falls Referrals and appointments  In addition, I have reviewed and discussed with patient certain  preventive protocols, quality metrics, and best practice recommendations. A written personalized care plan for preventive services as well as general preventive health recommendations were provided to patient.     Yvonna Alanis, NP   09/21/2022   Nurse Notes: none

## 2022-09-24 ENCOUNTER — Non-Acute Institutional Stay: Payer: Medicare Other | Admitting: Internal Medicine

## 2022-09-24 ENCOUNTER — Encounter: Payer: Self-pay | Admitting: Internal Medicine

## 2022-09-24 DIAGNOSIS — L719 Rosacea, unspecified: Secondary | ICD-10-CM

## 2022-09-24 DIAGNOSIS — I10 Essential (primary) hypertension: Secondary | ICD-10-CM

## 2022-09-24 DIAGNOSIS — I69919 Unspecified symptoms and signs involving cognitive functions following unspecified cerebrovascular disease: Secondary | ICD-10-CM

## 2022-09-24 DIAGNOSIS — E785 Hyperlipidemia, unspecified: Secondary | ICD-10-CM | POA: Diagnosis not present

## 2022-09-24 DIAGNOSIS — G4700 Insomnia, unspecified: Secondary | ICD-10-CM | POA: Diagnosis not present

## 2022-09-24 DIAGNOSIS — F339 Major depressive disorder, recurrent, unspecified: Secondary | ICD-10-CM

## 2022-09-24 DIAGNOSIS — G459 Transient cerebral ischemic attack, unspecified: Secondary | ICD-10-CM | POA: Diagnosis not present

## 2022-09-24 NOTE — Progress Notes (Unsigned)
Location:  [facility] Room Number: [place] of Service:  ALF [phone]) Provider:  Virgie Dad, MD   Virgie Dad, MD  Patient Care Team: Virgie Dad, MD as PCP - General (Internal Medicine) [place], [facility] Crista Luria, MD as Consulting Physician (Dermatology) Marilynne Halsted, MD as Referring Physician (Ophthalmology) Bond, Tracie Harrier, MD as Referring Physician (Ophthalmology) Virgie Dad, MD as Referring Physician (Internal Medicine)  Extended Emergency Contact Information Primary Emergency Contact: Osvaldo Human of [place] Phone: [phone] Relation: Son  Code Status:  DNR Goals of care: Advanced Directive information    09/24/2022    9:59 AM  Advanced Directives  Does Patient Have a Medical Advance Directive? Yes  Type of Advance Directive Living will;Healthcare Power of [place];Out of facility DNR (pink MOST or yellow form)  Does patient want to make changes to medical advance directive? No - Patient declined  Copy of [place] in Chart? Yes - validated most recent copy scanned in chart (See row information)     Chief Complaint  Patient presents with   Medical Management of Chronic Issues    Patient is being seen for a routine visit.    HPI:  Pt is a 89 y.o. male seen today for medical management of chronic diseases.    Seen For Regular Visit Lives in [place] with his walker   Patient has h/o Hypertension and Depression, TIA, Skin cancer Also has h/o TIA versus migraine Had detailed work-up by neurology for his atypical symptoms of visual impairment. With Aphasia and Word finding.He has had detail work ups including EEG, Echo and MRI .which have been Negative. They have recommended Asprin, BP control and Statin.  Patient is progressively getting worse   Past Medical History:  Diagnosis Date   Actinic keratosis 10/18/2012   Anisocoria 10/30/2014   Left pupil is larger than  the right postsurgery corneal transplant.    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/18/2012   Deafness 10/18/2012   Endothelial corneal dystrophy    Essential hypertension 10/18/2012   Hammer toe 04/24/2014   Hyperlipidemia 10/18/2012   Intermediate stage nonexudative age-related macular degeneration of both eyes 04/16/2016   Nocturia 05/19/2016   Occlusion and stenosis of carotid artery with cerebral infarction    Other specified cardiac dysrhythmias(427.89)    Pain in joint, pelvic region and thigh    Paresthesia 04/30/2015   Toes of both feet    Peripheral vascular disease, unspecified ([place])    Primary open angle glaucoma 09/25/2011   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   Rosacea    Seborrheic keratosis 04/24/2014   Syncope 06/27/2013   TIA (transient ischemic attack) 10/18/2012   States he has had about 5  times   Urine frequency    Past Surgical History:  Procedure Laterality Date   CATARACT EXTRACTION  2005   bilateral   CORNEAL TRANSPLANT  2010 left; 2013 right eye   [place]   Left   HERNIA REPAIR  03/2004   Right   SHOULDER DEBRIDEMENT Left 2005   TONSILLECTOMY Bilateral childhood   TRANSURETHRAL RESECTION OF PROSTATE N/A 10/2003   VASECTOMY      No Known Allergies  Outpatient Encounter Medications as of 09/24/2022  Medication Sig   acetaminophen (TYLENOL) 500 MG tablet Take 1,000 mg by mouth 3 (three) times daily as  needed for mild pain or moderate pain.   amLODipine (NORVASC) 5 MG tablet TAKE 1 TABLET DAILY   ammonium lactate (LAC-HYDRIN) 12 % lotion Apply 1 application. topically daily.   aspirin 325 MG tablet Take 325 mg by mouth every evening.   atorvastatin (LIPITOR) 40 MG tablet TAKE 1 TABLET DAILY   camphor-menthol (SARNA) lotion Apply 1 Application topically 2 (two) times daily as needed for itching.   DORZOLAMIDE HCL-TIMOLOL MAL OP Place 1 drop into  both eyes 2 (two) times daily.   enalapril (VASOTEC) 20 MG tablet Take 20 mg by mouth in the morning.   lactose free nutrition (BOOST) LIQD Take 237 mLs by mouth daily.   latanoprost (XALATAN) 0.005 % ophthalmic solution Place 1 drop into the left eye daily.   loratadine (CLARITIN) 10 MG tablet Take 10 mg by mouth daily.   melatonin 3 MG TABS tablet Take 3 mg by mouth at bedtime. 8pm   Multiple Vitamins-Minerals (MULTIVITAMIN ADULTS PO) Take 1 capsule by mouth every morning.   senna-docusate (SENOKOT-S) 8.6-50 MG tablet Take 1 tablet by mouth daily.   sertraline (ZOLOFT) 50 MG tablet Take 50 mg by mouth daily.   zinc oxide 20 % ointment Apply 1 Application topically as needed for irritation.   No facility-administered encounter medications on file as of 09/24/2022.    Review of Systems  Immunization History  Administered Date(s) Administered   Influenza Whole 05/03/2002, 04/19/2012, 04/20/2013   Influenza, High Dose Seasonal PF 05/03/2019, 05/13/2022   Influenza,inj,Quad PF,6+ Mos 04/21/2018   Influenza-Unspecified 05/03/2014, 04/18/2015, 04/30/2016, 04/18/2017, 04/09/2020, 05/13/2021   Moderna SARS-COV2 Booster Vaccination 12/25/2020   Moderna Sars-Covid-2 Vaccination 07/24/2019, 08/21/2019, 06/03/2020, 12/31/2021, 05/26/2022   Pfizer Covid-19 Vaccine Bivalent Booster 37yr & up 04/09/2021   Pneumococcal Conjugate-13 03/20/2010   Pneumococcal Polysaccharide-23 11/02/2017   Td 02/24/2018   Tdap 06/27/2011   Zoster Recombinat (Shingrix) 12/24/2017, 03/09/2018   Zoster, Live 11/30/2006   Pertinent  Health Maintenance Due  Topic Date Due   INFLUENZA VACCINE  Completed      04/21/2021   10:34 AM 08/25/2021   11:46 AM 07/01/2022   11:58 AM 07/29/2022    9:40 AM 09/21/2022    3:05 PM  Fall Risk  Falls in the past year?  1 1 0 1  Was there an injury with Fall?  0 0 0 0  Fall Risk Category Calculator  1 1 0 1  Fall Risk Category (Retired)  Low Low Low   (RETIRED) Patient Fall Risk  Level Moderate fall risk Moderate fall risk Moderate fall risk Moderate fall risk   Patient at Risk for Falls Due to  History of fall(s);Impaired balance/gait;Impaired mobility History of fall(s);Impaired balance/gait;Impaired mobility History of fall(s);Impaired balance/gait;Impaired mobility History of fall(s);Impaired balance/gait;Impaired mobility  Fall risk Follow up  Falls evaluation completed;Education provided Falls evaluation completed Falls evaluation completed Falls evaluation completed;Falls prevention discussed;Education provided   Functional Status Survey:    Vitals:   09/24/22 0956  BP: (!) 160/81  Pulse: 60  Resp: 20  Temp: (!) 97.5 F (36.4 C)  TempSrc: Temporal  SpO2: 95%  Weight: 163 lb 6.4 oz (74.1 kg)  Height: '5\' 7"'$  (1.702 m)   Body mass index is 25.59 kg/m. Physical Exam  Labs reviewed: Recent Labs    10/06/21 0000  NA 141  K 4.0  CL 107  CO2 29*  BUN 28*  CREATININE 1.2  CALCIUM 8.5*   Recent Labs    10/06/21 0000  AST 17  ALT  19  ALKPHOS 55  ALBUMIN 3.5   Recent Labs    10/06/21 0000  WBC 6.3  NEUTROABS 3,522.00  HGB 14.6  HCT 43  PLT 182   Lab Results  Component Value Date   TSH 2.73 04/07/2021   No results found for: "HGBA1C" Lab Results  Component Value Date   CHOL 126 10/06/2021   HDL 41 10/06/2021   LDLCALC 73 10/06/2021   TRIG 50 10/06/2021   CHOLHDL 2.9 05/09/2020    Significant Diagnostic Results in last 30 days:  No results found.  Assessment/Plan 1. Essential hypertension ***  2. Hyperlipidemia, unspecified hyperlipidemia type ***  3. TIA (transient ischemic attack) ***  4. Insomnia, unspecified type ***  5. 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 Depression, recurrent (HCC) ***  7. Rosacea ***    Family/ staff Communication: ***  Labs/tests ordered:  CBC,CMP,TSH Lipid

## 2022-09-28 LAB — LIPID PANEL
Cholesterol: 116 (ref 0–200)
HDL: 26 — AB (ref 35–70)
LDL Cholesterol: 73
Triglycerides: 91 (ref 40–160)

## 2022-09-28 LAB — CBC AND DIFFERENTIAL
HCT: 40 — AB (ref 41–53)
Hemoglobin: 14.3 (ref 13.5–17.5)
Neutrophils Absolute: 4094
Platelets: 190 10*3/uL (ref 150–400)
WBC: 6.7

## 2022-09-28 LAB — BASIC METABOLIC PANEL
BUN: 26 — AB (ref 4–21)
CO2: 27 — AB (ref 13–22)
Chloride: 107 (ref 99–108)
Creatinine: 1 (ref 0.6–1.3)
Glucose: 90
Potassium: 4.4 mEq/L (ref 3.5–5.1)
Sodium: 27 — AB (ref 137–147)

## 2022-09-28 LAB — COMPREHENSIVE METABOLIC PANEL
Albumin: 3.2 — AB (ref 3.5–5.0)
Calcium: 8.4 — AB (ref 8.7–10.7)
Globulin: 1.8

## 2022-09-28 LAB — HEPATIC FUNCTION PANEL
ALT: 15 U/L (ref 10–40)
AST: 17 (ref 14–40)
Alkaline Phosphatase: 39 (ref 25–125)
Bilirubin, Total: 0.8

## 2022-09-28 LAB — CBC: RBC: 4.55 (ref 3.87–5.11)

## 2022-09-28 LAB — TSH: TSH: 2.28 (ref 0.41–5.90)

## 2023-01-01 ENCOUNTER — Encounter: Payer: Self-pay | Admitting: Orthopedic Surgery

## 2023-01-01 ENCOUNTER — Non-Acute Institutional Stay: Payer: Medicare Other | Admitting: Orthopedic Surgery

## 2023-01-01 DIAGNOSIS — Z66 Do not resuscitate: Secondary | ICD-10-CM

## 2023-01-01 DIAGNOSIS — I69919 Unspecified symptoms and signs involving cognitive functions following unspecified cerebrovascular disease: Secondary | ICD-10-CM

## 2023-01-01 DIAGNOSIS — G4700 Insomnia, unspecified: Secondary | ICD-10-CM

## 2023-01-01 DIAGNOSIS — K5904 Chronic idiopathic constipation: Secondary | ICD-10-CM

## 2023-01-01 DIAGNOSIS — E785 Hyperlipidemia, unspecified: Secondary | ICD-10-CM | POA: Diagnosis not present

## 2023-01-01 DIAGNOSIS — G459 Transient cerebral ischemic attack, unspecified: Secondary | ICD-10-CM

## 2023-01-01 DIAGNOSIS — I10 Essential (primary) hypertension: Secondary | ICD-10-CM

## 2023-01-01 DIAGNOSIS — G8929 Other chronic pain: Secondary | ICD-10-CM

## 2023-01-01 DIAGNOSIS — F339 Major depressive disorder, recurrent, unspecified: Secondary | ICD-10-CM

## 2023-01-01 DIAGNOSIS — M545 Low back pain, unspecified: Secondary | ICD-10-CM

## 2023-01-01 NOTE — Progress Notes (Unsigned)
Location:  [facility] Room Number: AL29-A Place of Service:  ALF (13) Provider:Getsemani Lindon Norval Gable, NP    Patient Care Team: Mahlon Gammon, MD as PCP - General (Internal Medicine) [place], [facility] Campbell Stall, MD as Consulting Physician (Dermatology) Holli Humbles, MD as Referring Physician (Ophthalmology) Bond, Doran Stabler, MD as Referring Physician (Ophthalmology) Mahlon Gammon, MD as Referring Physician (Internal Medicine)  Extended Emergency Contact Information Primary Emergency Contact: Dianna Limbo of [place] Home Phone: [phone] Relation: Son  Code Status:  DNR Goals of care: Advanced Directive information    01/01/2023    9:43 AM  Advanced Directives  Does Patient Have a Medical Advance Directive? Yes  Type of Estate agent of [place];Out of facility DNR (pink MOST or yellow form)  Does patient want to make changes to medical advance directive? No - Patient declined  Copy of Healthcare Power of Attorney in Chart? Yes - validated most recent copy scanned in chart (See row information)  Pre-existing out of facility DNR order (yellow form or pink MOST form) Yellow form placed in chart (order not valid for inpatient use)     Chief Complaint  Patient presents with   Medical Management of Chronic Issues    Routine visit     HPI:  Pt is a 89 y.o. male seen today for medical management of chronic diseases.    He currently resides on the assisted living unit at [facility]. PMH: HTN, HLD, TIA, mixed hearing loss, actinic keratosis, glaucoma, joint pain, and depression.   HTN- BUN/creat 26/1.0 09/28/2022, remains on enalapril and amlodipine HLD- LDL 73 09/28/2022, remains on Lipitor TIA- last episode reported 10/18/2012, remains on asa and statin Cognitive deficit- MMSE 11/30 09/25/2022, MRI brain 12/2017 revealed age related cerebral atrophy with moderate to advanced chronic small vessel  ischemic disease, ambulates with walker, needing more cues from nursing to complete staff, on behaviors Chronic back pain-  remains on tylenol prn started Insomnia- remains on melatonin Depression- unsuccessful trial of Wellbutrin (increased anxiety), Na+ 142 09/28/2022, sister passed 09/2022, good family support, remains on Zoloft Constipation- remains on senna  No recent falls or injuries.   Recent blood pressures:  06/12- 136/81  06/05- 124/70  05/29- 149/77  Recent weights:  06/01- 157.6 lbs  05/03- 158.8 lbs  04/07- 158.4 lbs    Past Medical History:  Diagnosis Date   Actinic keratosis 10/18/2012   Anisocoria 10/30/2014   Left pupil is larger than the right postsurgery corneal transplant.    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/18/2012   Deafness 10/18/2012   Endothelial corneal dystrophy    Essential hypertension 10/18/2012   Hammer toe 04/24/2014   Hyperlipidemia 10/18/2012   Intermediate stage nonexudative age-related macular degeneration of both eyes 04/16/2016   Nocturia 05/19/2016   Occlusion and stenosis of carotid artery with cerebral infarction    Other specified cardiac dysrhythmias(427.89)    Pain in joint, pelvic region and thigh    Paresthesia 04/30/2015   Toes of both feet    Peripheral vascular disease, unspecified (HCC)    Primary open angle glaucoma 09/25/2011   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   Rosacea    Seborrheic keratosis 04/24/2014   Syncope 06/27/2013   TIA (transient ischemic attack) 10/18/2012   States he has had about 5  times   Urine frequency    Past  Surgical History:  Procedure Laterality Date   CATARACT EXTRACTION  2005   bilateral   CORNEAL TRANSPLANT  2010 left; 2013 right eye   HERNIA REPAIR  1985   Left   HERNIA REPAIR  03/2004   Right   SHOULDER DEBRIDEMENT Left 2005   TONSILLECTOMY Bilateral childhood   TRANSURETHRAL  RESECTION OF PROSTATE N/A 10/2003   VASECTOMY      No Known Allergies  Outpatient Encounter Medications as of 01/01/2023  Medication Sig   acetaminophen (TYLENOL) 500 MG tablet Take 1,000 mg by mouth 3 (three) times daily as needed for mild pain or moderate pain.   amLODipine (NORVASC) 5 MG tablet TAKE 1 TABLET DAILY   ammonium lactate (LAC-HYDRIN) 12 % lotion Apply 1 application. topically daily.   aspirin 325 MG tablet Take 325 mg by mouth every evening.   atorvastatin (LIPITOR) 40 MG tablet TAKE 1 TABLET DAILY   camphor-menthol (SARNA) lotion Apply 1 Application topically as needed for itching.   DORZOLAMIDE HCL-TIMOLOL MAL OP Place 1 drop into both eyes 2 (two) times daily.   enalapril (VASOTEC) 20 MG tablet Take 20 mg by mouth in the morning.   lactose free nutrition (BOOST) LIQD Take 237 mLs by mouth daily.   latanoprost (XALATAN) 0.005 % ophthalmic solution Place 1 drop into the left eye daily.   loratadine (CLARITIN) 10 MG tablet Take 10 mg by mouth daily.   melatonin 3 MG TABS tablet Take 3 mg by mouth at bedtime. 8pm   Multiple Vitamins-Minerals (MULTIVITAMIN ADULTS PO) Take 1 capsule by mouth every morning.   senna-docusate (SENOKOT-S) 8.6-50 MG tablet Take 1 tablet by mouth daily.   sertraline (ZOLOFT) 50 MG tablet Take 50 mg by mouth daily.   zinc oxide 20 % ointment Apply 1 Application topically as needed for irritation.   No facility-administered encounter medications on file as of 01/01/2023.    Review of Systems  Constitutional:  Negative for activity change and appetite change.  HENT:  Positive for hearing loss. Negative for trouble swallowing.   Eyes:  Positive for visual disturbance.  Respiratory:  Negative for cough, shortness of breath and wheezing.   Cardiovascular:  Negative for chest pain and leg swelling.  Gastrointestinal:  Positive for constipation. Negative for abdominal distention and abdominal pain.  Genitourinary:  Negative for dysuria and hematuria.        Incontinence   Musculoskeletal:  Positive for back pain and gait problem.  Skin:  Negative for wound.  Neurological:  Positive for weakness. Negative for dizziness and headaches.  Psychiatric/Behavioral:  Positive for confusion, dysphoric mood and sleep disturbance. The patient is not nervous/anxious.     Immunization History  Administered Date(s) Administered   Influenza Whole 05/03/2002, 04/19/2012, 04/20/2013   Influenza, High Dose Seasonal PF 05/03/2019, 05/13/2022   Influenza,inj,Quad PF,6+ Mos 04/21/2018   Influenza-Unspecified 05/03/2014, 04/18/2015, 04/30/2016, 04/18/2017, 04/09/2020, 05/13/2021   Moderna SARS-COV2 Booster Vaccination 12/25/2020   Moderna Sars-Covid-2 Vaccination 07/24/2019, 08/21/2019, 06/03/2020, 12/31/2021, 05/26/2022   Pfizer Covid-19 Vaccine Bivalent Booster 37yrs & up 04/09/2021   Pneumococcal Conjugate-13 03/20/2010   Pneumococcal Polysaccharide-23 11/02/2017   Td 02/24/2018   Tdap 06/27/2011   Zoster Recombinat (Shingrix) 12/24/2017, 03/09/2018   Zoster, Live 11/30/2006   Pertinent  Health Maintenance Due  Topic Date Due   INFLUENZA VACCINE  02/18/2023      04/21/2021   10:34 AM 08/25/2021   11:46 AM 07/01/2022   11:58 AM 07/29/2022    9:40 AM 09/21/2022    3:05  PM  Fall Risk  Falls in the past year?  1 1 0 1  Was there an injury with Fall?  0 0 0 0  Fall Risk Category Calculator  1 1 0 1  Fall Risk Category (Retired)  Low Low Low   (RETIRED) Patient Fall Risk Level Moderate fall risk Moderate fall risk Moderate fall risk Moderate fall risk   Patient at Risk for Falls Due to  History of fall(s);Impaired balance/gait;Impaired mobility History of fall(s);Impaired balance/gait;Impaired mobility History of fall(s);Impaired balance/gait;Impaired mobility History of fall(s);Impaired balance/gait;Impaired mobility  Fall risk Follow up  Falls evaluation completed;Education provided Falls evaluation completed Falls evaluation completed Falls evaluation  completed;Falls prevention discussed;Education provided   Functional Status Survey:    Vitals:   01/01/23 0940  BP: 136/81  Pulse: 95  Resp: 19  Temp: (!) 96.5 F (35.8 C)  SpO2: 97%  Weight: 157 lb (71.2 kg)  Height: 5\' 7"  (1.702 m)   Body mass index is 24.59 kg/m. Physical Exam Vitals reviewed.  Constitutional:      General: He is not in acute distress. HENT:     Head: Normocephalic.     Right Ear: There is no impacted cerumen.     Left Ear: There is no impacted cerumen.     Nose: Nose normal.     Mouth/Throat:     Mouth: Mucous membranes are moist.  Eyes:     General:        Right eye: No discharge.        Left eye: No discharge.  Cardiovascular:     Rate and Rhythm: Normal rate and regular rhythm.     Pulses: Normal pulses.     Heart sounds: Normal heart sounds.  Pulmonary:     Effort: Pulmonary effort is normal. No respiratory distress.     Breath sounds: Normal breath sounds. No wheezing.  Abdominal:     General: Bowel sounds are normal.     Palpations: Abdomen is soft.  Musculoskeletal:     Cervical back: Neck supple.     Right lower leg: No edema.     Left lower leg: No edema.  Skin:    General: Skin is warm.     Capillary Refill: Capillary refill takes less than 2 seconds.  Neurological:     General: No focal deficit present.     Motor: Weakness present.     Gait: Gait abnormal.  Psychiatric:        Mood and Affect: Mood normal.     Comments: Flat affect, delayed responses, alert to self/familiar face/place     Labs reviewed: Recent Labs    03/30/22 0000 09/28/22 0000  NA 141 27*  K 4.2 4.4  CL 105 107  CO2 26* 27*  BUN 26* 26*  CREATININE 1.1 1.0  CALCIUM 9.0 8.4*   Recent Labs    03/30/22 0000 09/28/22 0000  AST 22 17  ALT 20 15  ALKPHOS 69 39  ALBUMIN 4.0 3.2*   Recent Labs    03/30/22 0000 04/02/22 0000 09/28/22 0000  WBC 12.0 11.4 6.7  NEUTROABS  --  5,746.00 4,094.00  HGB 16.0 15.5 14.3  HCT 46 44 40*  PLT 223  201 190   Lab Results  Component Value Date   TSH 2.28 09/28/2022   No results found for: "HGBA1C" Lab Results  Component Value Date   CHOL 116 09/28/2022   HDL 26 (A) 09/28/2022   LDLCALC 73 09/28/2022   TRIG 91  09/28/2022   CHOLHDL 2.9 05/09/2020    Significant Diagnostic Results in last 30 days:  No results found.  Assessment/Plan 1. Essential hypertension - controlled with enalapril and amlodipine  2. Hyperlipidemia, unspecified hyperlipidemia type - cont statin  3. TIA (transient ischemic attack) - cont asa and statin  4. 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 Chronic midline low back pain without sciatica - cont tylenol prn  6. Insomnia, unspecified type - stable with melatonin  7. Depression, recurrent (HCC) - sister passed a few months ago - no mood changes - Na+ stable - cont Zoloft  8. Chronic idiopathic constipation - abdomen soft - cont senna     Family/ staff Communication: plan discussed with patient and nurse  Labs/tests ordered:  none

## 2023-02-17 ENCOUNTER — Non-Acute Institutional Stay: Payer: Medicare Other | Admitting: Orthopedic Surgery

## 2023-02-17 ENCOUNTER — Encounter: Payer: Self-pay | Admitting: Orthopedic Surgery

## 2023-02-17 DIAGNOSIS — I69919 Unspecified symptoms and signs involving cognitive functions following unspecified cerebrovascular disease: Secondary | ICD-10-CM

## 2023-02-17 DIAGNOSIS — M7989 Other specified soft tissue disorders: Secondary | ICD-10-CM

## 2023-02-17 NOTE — Progress Notes (Signed)
Careteam: Patient Care Team: Mahlon Gammon, MD as PCP - General (Internal Medicine) [place], [facility] Campbell Stall, MD as Consulting Physician (Dermatology) Holli Humbles, MD as Referring Physician (Ophthalmology) Bond, Doran Stabler, MD as Referring Physician (Ophthalmology) Mahlon Gammon, MD as Referring Physician (Internal Medicine)  Seen by: Hazle Nordmann, AGNP-C  PLACE OF SERVICE:  [facility]  Advanced Directive information    No Known Allergies  Chief Complaint  Patient presents with   Acute Home Visit    Right hand swelling     HPI: Patient is a 89 y.o. male seen today for acute visit due to right hand swelling.   He currently resides on the assisted living unit at [facility] PMH: HTN, HLD, TIA, mixed hearing loss, actinic keratosis, glaucoma, joint pain, and depression.   This morning nursing noticed right hand swelling. He denies pain. He is able to move hand without difficulty. No recent injury or fall reported. H/o gout, he was prescribed allopurinol in past. Afebrile. Vitals stable.   H/o cognitive deficit. No behaviors. Needing more cues to accomplish tasks. Ambulates with walker. No recent falls.   Review of Systems:  Review of Systems  Unable to perform ROS: Other (cognitive deficit)    Past Medical History:  Diagnosis Date   Actinic keratosis 10/18/2012   Anisocoria 10/30/2014   Left pupil is larger than the right postsurgery corneal transplant.    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/18/2012   Deafness 10/18/2012   Endothelial corneal dystrophy    Essential hypertension 10/18/2012   Hammer toe 04/24/2014   Hyperlipidemia 10/18/2012   Intermediate stage nonexudative age-related macular degeneration of both eyes 04/16/2016   Nocturia 05/19/2016   Occlusion and stenosis of carotid artery with cerebral infarction    Other  specified cardiac dysrhythmias(427.89)    Pain in joint, pelvic region and thigh    Paresthesia 04/30/2015   Toes of both feet    Peripheral vascular disease, unspecified (HCC)    Primary open angle glaucoma 09/25/2011   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   Rosacea    Seborrheic keratosis 04/24/2014   Syncope 06/27/2013   TIA (transient ischemic attack) 10/18/2012   States he has had about 5  times   Urine frequency    Past Surgical History:  Procedure Laterality Date   CATARACT EXTRACTION  2005   bilateral   CORNEAL TRANSPLANT  2010 left; 2013 right eye   HERNIA REPAIR  1985   Left   HERNIA REPAIR  03/2004   Right   SHOULDER DEBRIDEMENT Left 2005   TONSILLECTOMY Bilateral childhood   TRANSURETHRAL RESECTION OF PROSTATE N/A 10/2003   VASECTOMY     Social History:   reports that he quit smoking about 58 years ago. His smoking use included cigarettes. He started smoking about 83 years ago. He has a 25 pack-year smoking history. He has never used smokeless tobacco. He reports that he does not drink alcohol and does not use drugs.  Family History  Problem Relation Age of Onset   Cancer Mother    Heart disease Father     Medications: Patient's Medications  New Prescriptions   No medications on file  Previous Medications   ACETAMINOPHEN (TYLENOL) 500 MG TABLET    Take 1,000 mg by mouth 3 (three) times daily as needed for mild pain or moderate pain.   AMLODIPINE (NORVASC) 5  MG TABLET    TAKE 1 TABLET DAILY   AMMONIUM LACTATE (LAC-HYDRIN) 12 % LOTION    Apply 1 application. topically daily.   ASPIRIN 325 MG TABLET    Take 325 mg by mouth every evening.   ATORVASTATIN (LIPITOR) 40 MG TABLET    TAKE 1 TABLET DAILY   CAMPHOR-MENTHOL (SARNA) LOTION    Apply 1 Application topically as needed for itching.   DORZOLAMIDE HCL-TIMOLOL MAL OP    Place 1 drop into both eyes 2 (two) times daily.   ENALAPRIL (VASOTEC) 20 MG TABLET    Take 20 mg by mouth in the morning.   LACTOSE FREE NUTRITION (BOOST) LIQD     Take 237 mLs by mouth daily.   LATANOPROST (XALATAN) 0.005 % OPHTHALMIC SOLUTION    Place 1 drop into the left eye daily.   LORATADINE (CLARITIN) 10 MG TABLET    Take 10 mg by mouth daily.   MELATONIN 3 MG TABS TABLET    Take 3 mg by mouth at bedtime. 8pm   MULTIPLE VITAMINS-MINERALS (MULTIVITAMIN ADULTS PO)    Take 1 capsule by mouth every morning.   SENNA-DOCUSATE (SENOKOT-S) 8.6-50 MG TABLET    Take 1 tablet by mouth daily.   SERTRALINE (ZOLOFT) 50 MG TABLET    Take 50 mg by mouth daily.   ZINC OXIDE 20 % OINTMENT    Apply 1 Application topically as needed for irritation.  Modified Medications   No medications on file  Discontinued Medications   No medications on file    Physical Exam:  Vitals:   02/17/23 1110  BP: 139/64  Pulse: 65  Resp: 20  Temp: 98.2 F (36.8 C)  SpO2: 97%  Weight: 155 lb 12.8 oz (70.7 kg)  Height: 5\' 7"  (1.702 m)   Body mass index is 24.4 kg/m. Wt Readings from Last 3 Encounters:  02/17/23 155 lb 12.8 oz (70.7 kg)  01/01/23 157 lb (71.2 kg)  09/24/22 163 lb 6.4 oz (74.1 kg)    Physical Exam Vitals reviewed.  Constitutional:      General: He is not in acute distress. HENT:     Head: Normocephalic.  Eyes:     General:        Right eye: No discharge.        Left eye: No discharge.  Cardiovascular:     Rate and Rhythm: Normal rate and regular rhythm.     Pulses: Normal pulses.     Heart sounds: Normal heart sounds.  Pulmonary:     Effort: Pulmonary effort is normal. No respiratory distress.     Breath sounds: Normal breath sounds. No wheezing.  Abdominal:     Palpations: Abdomen is soft.  Musculoskeletal:     Left wrist: No swelling, deformity, tenderness or crepitus. Normal range of motion.     Left hand: Swelling present. No deformity or tenderness. Normal range of motion. Normal strength.     Cervical back: Neck supple.     Right lower leg: No edema.     Left lower leg: No edema.     Comments: Right hand and digits x 5 with mild  swelling, hand grip 5/5, raidal pulse 1+, no deformity, skin breakdown, warmth  Skin:    General: Skin is warm.     Capillary Refill: Capillary refill takes less than 2 seconds.  Neurological:     General: No focal deficit present.     Mental Status: He is alert.     Motor: Weakness  present.     Gait: Gait abnormal.  Psychiatric:        Mood and Affect: Mood normal.     Labs reviewed: Basic Metabolic Panel: Recent Labs    03/30/22 0000 09/28/22 0000  NA 141 27*  K 4.2 4.4  CL 105 107  CO2 26* 27*  BUN 26* 26*  CREATININE 1.1 1.0  CALCIUM 9.0 8.4*  TSH 2.88 2.28   Liver Function Tests: Recent Labs    03/30/22 0000 09/28/22 0000  AST 22 17  ALT 20 15  ALKPHOS 69 39  ALBUMIN 4.0 3.2*   No results for input(s): "LIPASE", "AMYLASE" in the last 8760 hours. No results for input(s): "AMMONIA" in the last 8760 hours. CBC: Recent Labs    03/30/22 0000 04/02/22 0000 09/28/22 0000  WBC 12.0 11.4 6.7  NEUTROABS  --  5,746.00 4,094.00  HGB 16.0 15.5 14.3  HCT 46 44 40*  PLT 223 201 190   Lipid Panel: Recent Labs    03/30/22 0000 09/28/22 0000  CHOL 155 116  HDL 38 26*  LDLCALC 98 73  TRIG 92 91   TSH: Recent Labs    03/30/22 0000 09/28/22 0000  TSH 2.88 2.28   A1C: No results found for: "HGBA1C"   Assessment/Plan 1. Swelling of right hand - right hand a digits x 5 with swelling - no pain, warmth or apparent injury - h/o gout> prescribed allopurinol in past - right hand FROM, hand grip 5/5 - recommend watchful waiting - elevate right hand while in bed  2. 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

## 2023-03-08 ENCOUNTER — Non-Acute Institutional Stay: Payer: Medicare Other | Admitting: Nurse Practitioner

## 2023-03-08 ENCOUNTER — Encounter: Payer: Self-pay | Admitting: Nurse Practitioner

## 2023-03-08 DIAGNOSIS — E785 Hyperlipidemia, unspecified: Secondary | ICD-10-CM | POA: Diagnosis not present

## 2023-03-08 DIAGNOSIS — I10 Essential (primary) hypertension: Secondary | ICD-10-CM | POA: Diagnosis not present

## 2023-03-08 DIAGNOSIS — F339 Major depressive disorder, recurrent, unspecified: Secondary | ICD-10-CM | POA: Diagnosis not present

## 2023-03-08 DIAGNOSIS — R55 Syncope and collapse: Secondary | ICD-10-CM | POA: Diagnosis not present

## 2023-03-08 DIAGNOSIS — K5904 Chronic idiopathic constipation: Secondary | ICD-10-CM

## 2023-03-08 NOTE — Progress Notes (Unsigned)
Location:   [facility] Room Number: 38 Place of Service:  ALF (13) Provider: Arna Snipe Lalitha Ilyas NP  Mahlon Gammon, MD  Patient Care Team: Mahlon Gammon, MD as PCP - General (Internal Medicine) Shelva Majestic, [facility] Campbell Stall, MD as Consulting Physician (Dermatology) Holli Humbles, MD as Referring Physician (Ophthalmology) Bond, Doran Stabler, MD as Referring Physician (Ophthalmology) Mahlon Gammon, MD as Referring Physician (Internal Medicine)  Extended Emergency Contact Information Primary Emergency Contact: [name] [place] Home Phone: [phone] Relation: Son  Code Status:  DNR Goals of care: Advanced Directive information    01/01/2023    9:43 AM  Advanced Directives  Does Patient Have a Medical Advance Directive? Yes  Type of Estate agent of [place];Out of facility DNR (pink MOST or yellow form)  Does patient want to make changes to medical advance directive? No - Patient declined  Copy of Healthcare Power of Attorney in Chart? Yes - validated most recent copy scanned in chart (See row information)  Pre-existing out of facility DNR order (yellow form or pink MOST form) Yellow form placed in chart (order not valid for inpatient use)     Chief Complaint  Patient presents with  . Acute Visit    Syncope while having BM, resolved w/o intervention    HPI:  Pt is a 89 y.o. male seen today for an acute visit for syncope while having BM resolved w/o intervention. Denied headache, change of vision, chest pain/palpitation, or focal weakness.    Past Medical History:  Diagnosis Date  . Actinic keratosis 10/18/2012  . 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   . Atrophy, Fuchs' 12/10/2011  . 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 Cervicalgia 04/25/2013  . 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 Cyclitis, chronic 11/18/2012  . Deafness 10/18/2012  .  Endothelial corneal dystrophy   . Essential hypertension 10/18/2012  . Hammer toe 04/24/2014  . Hyperlipidemia 10/18/2012  . Intermediate stage nonexudative age-related macular degeneration of both eyes 04/16/2016  . Nocturia 05/19/2016  . Occlusion and stenosis of carotid artery with cerebral infarction   . Other specified cardiac dysrhythmias(427.89)   . Pain in joint, pelvic region and thigh   . Paresthesia 04/30/2015   Toes of both feet   . Peripheral vascular disease, unspecified (HCC)   . Primary open angle glaucoma 09/25/2011  . 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  . Rosacea   . Seborrheic keratosis 04/24/2014  . Syncope 06/27/2013  . TIA (transient ischemic attack) 10/18/2012   States he has had about 5  times  . Urine frequency    Past Surgical History:  Procedure Laterality Date  . CATARACT EXTRACTION  2005   bilateral  . CORNEAL TRANSPLANT  2010 left; 2013 right eye  . HERNIA REPAIR  1985   Left  . HERNIA REPAIR  03/2004   Right  . SHOULDER DEBRIDEMENT Left 2005  . TONSILLECTOMY Bilateral childhood  . TRANSURETHRAL RESECTION OF PROSTATE N/A 10/2003  . VASECTOMY      No Known Allergies  Allergies as of 03/08/2023   No Known Allergies      Medication List        Accurate as of March 08, 2023  4:50 PM. If you have any questions, ask your nurse or doctor.          acetaminophen 500 MG tablet Commonly known as: TYLENOL Take 1,000 mg by mouth 3 (three) times daily as  needed for mild pain or moderate pain.   amLODipine 5 MG tablet Commonly known as: NORVASC TAKE 1 TABLET DAILY   ammonium lactate 12 % lotion Commonly known as: LAC-HYDRIN Apply 1 application. topically daily.   aspirin 325 MG tablet Take 325 mg by mouth every evening.   atorvastatin 40 MG tablet Commonly known as: LIPITOR TAKE 1 TABLET DAILY   camphor-menthol lotion Commonly known as: SARNA Apply 1 Application topically as needed for itching.   DORZOLAMIDE HCL-TIMOLOL MAL OP Place 1 drop into both  eyes 2 (two) times daily.   enalapril 20 MG tablet Commonly known as: VASOTEC Take 20 mg by mouth in the morning.   lactose free nutrition Liqd Take 237 mLs by mouth daily.   latanoprost 0.005 % ophthalmic solution Commonly known as: XALATAN Place 1 drop into the left eye daily.   loratadine 10 MG tablet Commonly known as: CLARITIN Take 10 mg by mouth daily.   melatonin 3 MG Tabs tablet Take 3 mg by mouth at bedtime. 8pm   MULTIVITAMIN ADULTS PO Take 1 capsule by mouth every morning.   senna-docusate 8.6-50 MG tablet Commonly known as: Senokot-S Take 1 tablet by mouth daily.   sertraline 50 MG tablet Commonly known as: ZOLOFT Take 50 mg by mouth daily.   zinc oxide 20 % ointment Apply 1 Application topically as needed for irritation.        Review of Systems  Immunization History  Administered Date(s) Administered  . Influenza Whole 05/03/2002, 04/19/2012, 04/20/2013  . Influenza, High Dose Seasonal PF 05/03/2019, 05/13/2022  . Influenza,inj,Quad PF,6+ Mos 04/21/2018  . Influenza-Unspecified 05/03/2014, 04/18/2015, 04/30/2016, 04/18/2017, 04/09/2020, 05/13/2021  . Moderna SARS-COV2 Booster Vaccination 12/25/2020  . Moderna Sars-Covid-2 Vaccination 07/24/2019, 08/21/2019, 06/03/2020, 12/31/2021, 05/26/2022  . Research officer, [employer] 87yrs & up 04/09/2021  . Pneumococcal Conjugate-13 03/20/2010  . Pneumococcal Polysaccharide-23 11/02/2017  . Td 02/24/2018  . Tdap 06/27/2011  . Zoster Recombinant(Shingrix) 12/24/2017, 03/09/2018  . Zoster, Live 11/30/2006   Pertinent  Health Maintenance Due  Topic Date Due  . INFLUENZA VACCINE  02/18/2023      04/21/2021   10:34 AM 08/25/2021   11:46 AM 07/01/2022   11:58 AM 07/29/2022    9:40 AM 09/21/2022    3:05 PM  Fall Risk  Falls in the past year?  1 1 0 1  Was there an injury with Fall?  0 0 0 0  Fall Risk Category Calculator  1 1 0 1  Fall Risk Category (Retired)  Low Low Low   (RETIRED) Patient  Fall Risk Level Moderate fall risk Moderate fall risk Moderate fall risk Moderate fall risk   Patient at Risk for Falls Due to  History of fall(s);Impaired balance/gait;Impaired mobility History of fall(s);Impaired balance/gait;Impaired mobility History of fall(s);Impaired balance/gait;Impaired mobility History of fall(s);Impaired balance/gait;Impaired mobility  Fall risk Follow up  Falls evaluation completed;Education provided Falls evaluation completed Falls evaluation completed Falls evaluation completed;Falls prevention discussed;Education provided   Functional Status Survey:    Vitals:   03/08/23 1648  BP: (!) 104/56  Pulse: 74  Resp: 20  Temp: (!) 97.5 F (36.4 C)  SpO2: 99%   There is no height or weight on file to calculate BMI. Physical Exam  Labs reviewed: Recent Labs    03/30/22 0000 09/28/22 0000  NA 141 27*  K 4.2 4.4  CL 105 107  CO2 26* 27*  BUN 26* 26*  CREATININE 1.1 1.0  CALCIUM 9.0 8.4*   Recent Labs  03/30/22 0000 09/28/22 0000  AST 22 17  ALT 20 15  ALKPHOS 69 39  ALBUMIN 4.0 3.2*   Recent Labs    03/30/22 0000 04/02/22 0000 09/28/22 0000  WBC 12.0 11.4 6.7  NEUTROABS  --  5,746.00 4,094.00  HGB 16.0 15.5 14.3  HCT 46 44 40*  PLT 223 201 190   Lab Results  Component Value Date   TSH 2.28 09/28/2022   No results found for: "HGBA1C" Lab Results  Component Value Date   CHOL 116 09/28/2022   HDL 26 (A) 09/28/2022   LDLCALC 73 09/28/2022   TRIG 91 09/28/2022   CHOLHDL 2.9 05/09/2020    Significant Diagnostic Results in last 30 days:  No results found.  Assessment/Plan No problem-specific Assessment & Plan notes found for this encounter.    Family/ staff Communication: plan of care reviewed with the patient and charge nurse.   Labs/tests ordered:  none  Time spend 40 minutes

## 2023-03-08 NOTE — Assessment & Plan Note (Signed)
Vasovagal episode, avoid constipation-Colace 100mg  bid, loose Bp control, decreased Amlodipine 2.5mg  every day. Monitor VS

## 2023-03-09 NOTE — Assessment & Plan Note (Signed)
refused antidepressant in the past. Hx of Wellbutrin use. Stable on Sertraline. TSH 2.28 09/28/22

## 2023-03-09 NOTE — Assessment & Plan Note (Signed)
Loose Bp control, takes Amlodipine, Enalapril, Bun/creat 26/1.0 09/28/22

## 2023-03-09 NOTE — Assessment & Plan Note (Signed)
Avoid constipation, continue Senokot, adding Colace bid

## 2023-03-09 NOTE — Assessment & Plan Note (Signed)
takes Atorvastatin. LDL 73 09/28/22

## 2023-04-15 ENCOUNTER — Encounter: Payer: Self-pay | Admitting: Internal Medicine

## 2023-04-15 ENCOUNTER — Non-Acute Institutional Stay: Payer: Medicare Other | Admitting: Internal Medicine

## 2023-04-15 DIAGNOSIS — G459 Transient cerebral ischemic attack, unspecified: Secondary | ICD-10-CM

## 2023-04-15 DIAGNOSIS — F339 Major depressive disorder, recurrent, unspecified: Secondary | ICD-10-CM

## 2023-04-15 DIAGNOSIS — E785 Hyperlipidemia, unspecified: Secondary | ICD-10-CM

## 2023-04-15 DIAGNOSIS — I10 Essential (primary) hypertension: Secondary | ICD-10-CM | POA: Diagnosis not present

## 2023-04-15 NOTE — Progress Notes (Signed)
Location:  [facility] Room Number: AL29A Place of Service:  ALF [phone]) Provider:  Mahlon Gammon, MD  Patient Care Team: Mahlon Gammon, MD as PCP - General (Internal Medicine) Shelva Majestic, [facility] Campbell Stall, MD as Consulting Physician (Dermatology) Holli Humbles, MD as Referring Physician (Ophthalmology) Bond, Doran Stabler, MD as Referring Physician (Ophthalmology) Mahlon Gammon, MD as Referring Physician (Internal Medicine)  Extended Emergency Contact Information Primary Emergency Contact: Dianna [place] of [place] Home Phone: [phone] Relation: Son  Code Status:  DNR Goals of care: Advanced Directive information    04/15/2023    2:22 PM  Advanced Directives  Does Patient Have a Medical Advance Directive? Yes  Type of Estate agent of [place];Out of facility DNR (pink MOST or yellow form);Living will  Does patient want to make changes to medical advance directive? No - Patient declined  Copy of Healthcare Power of Attorney in Chart? Yes - validated most recent copy scanned in chart (See row information)     Chief Complaint  Patient presents with   Medical Management of Chronic Issues    Medical Management of Chronic Issues.     HPI:  Pt is a 89 y.o. male seen today for medical management of chronic diseases.    Seen For Regular Visit Lives in [place] Walks with his walker   Patient has h/o Hypertension and Depression, TIA, Skin cancer Also has h/o TIA versus migraine Had detailed work-up by neurology for his atypical symptoms of visual impairment. With Aphasia and Word finding.He has had detail work ups including EEG, Echo and MRI .which have been Negative. They have recommended Asprin, BP control and Statin.   Marland Kitchen  He is stable. Worsening Cognition  Needs help with is ADLS especially as Urinary and Stool Incontinence  Was not very interactive today Has Aphasia   No Behavior issues His weight is  stable Walks with her walker No Falls Has lost some weight No Behaviors Wt Readings from Last 3 Encounters:  04/15/23 157 lb 9.6 oz (71.5 kg)  02/17/23 155 lb 12.8 oz (70.7 kg)  01/01/23 157 lb (71.2 kg)   Past Medical History:  Diagnosis Date   Actinic keratosis 10/18/2012   Anisocoria 10/30/2014   Left pupil is larger than the right postsurgery corneal transplant.    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/18/2012   Deafness 10/18/2012   Endothelial corneal dystrophy    Essential hypertension 10/18/2012   Hammer toe 04/24/2014   Hyperlipidemia 10/18/2012   Intermediate stage nonexudative age-related macular degeneration of both eyes 04/16/2016   Nocturia 05/19/2016   Occlusion and stenosis of carotid artery with cerebral infarction    Other specified cardiac dysrhythmias(427.89)    Pain in joint, pelvic region and thigh    Paresthesia 04/30/2015   Toes of both feet    Peripheral vascular disease, unspecified (HCC)    Primary open angle glaucoma 09/25/2011   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   Rosacea    Seborrheic keratosis 04/24/2014   Syncope 06/27/2013   TIA (transient ischemic attack) 10/18/2012   States he has had about 5  times   Urine frequency    Past Surgical History:  Procedure Laterality Date   CATARACT EXTRACTION  2005   bilateral   CORNEAL TRANSPLANT  2010 left; 2013 right eye   HERNIA REPAIR  1985   Left  HERNIA REPAIR  03/2004   Right   SHOULDER DEBRIDEMENT Left 2005   TONSILLECTOMY Bilateral childhood   TRANSURETHRAL RESECTION OF PROSTATE N/A 10/2003   VASECTOMY      No Known Allergies  Outpatient Encounter Medications as of 04/15/2023  Medication Sig   acetaminophen (TYLENOL) 500 MG tablet Take 1,000 mg by mouth 3 (three) times daily as needed for mild pain or moderate pain.   amLODipine (NORVASC) 5 MG tablet TAKE 1 TABLET DAILY   ammonium lactate  (LAC-HYDRIN) 12 % lotion Apply 1 application. topically daily.   aspirin 325 MG tablet Take 325 mg by mouth every evening.   atorvastatin (LIPITOR) 40 MG tablet TAKE 1 TABLET DAILY   camphor-menthol (SARNA) lotion Apply 1 Application topically as needed for itching.   dextromethorphan-guaiFENesin (ROBITUSSIN-DM) 10-100 MG/5ML liquid Take 5 mLs by mouth every 6 (six) hours as needed for cough.   docusate sodium (COLACE) 100 MG capsule Take 100 mg by mouth 2 (two) times daily.   DORZOLAMIDE HCL-TIMOLOL MAL OP Place 1 drop into both eyes 2 (two) times daily.   enalapril (VASOTEC) 20 MG tablet Take 20 mg by mouth in the morning.   lactose free nutrition (BOOST) LIQD Take 237 mLs by mouth daily.   latanoprost (XALATAN) 0.005 % ophthalmic solution Place 1 drop into the left eye daily.   loratadine (CLARITIN) 10 MG tablet Take 10 mg by mouth daily.   melatonin 3 MG TABS tablet Take 3 mg by mouth at bedtime. 8pm   Multiple Vitamins-Minerals (MULTIVITAMIN ADULTS PO) Take 1 capsule by mouth every morning.   senna-docusate (SENOKOT-S) 8.6-50 MG tablet Take 1 tablet by mouth daily.   sertraline (ZOLOFT) 50 MG tablet Take 50 mg by mouth daily.   zinc oxide 20 % ointment Apply 1 Application topically as needed for irritation.   No facility-administered encounter medications on file as of 04/15/2023.    Review of Systems  Unable to perform ROS: Dementia    Immunization History  Administered Date(s) Administered   Influenza Whole 05/03/2002, 04/19/2012, 04/20/2013   Influenza, High Dose Seasonal PF 05/03/2019, 05/13/2022   Influenza,inj,Quad PF,6+ Mos 04/21/2018   Influenza-Unspecified 05/03/2014, 04/18/2015, 04/30/2016, 04/18/2017, 04/09/2020, 05/13/2021   Moderna SARS-COV2 Booster Vaccination 12/25/2020   Moderna Sars-Covid-2 Vaccination 07/24/2019, 08/21/2019, 06/03/2020, 12/31/2021, 05/26/2022   Pfizer Covid-19 Vaccine Bivalent Booster 77yrs & up 04/09/2021   Pneumococcal Conjugate-13 03/20/2010    Pneumococcal Polysaccharide-23 11/02/2017   Td 02/24/2018   Tdap 06/27/2011   Zoster Recombinant(Shingrix) 12/24/2017, 03/09/2018   Zoster, Live 11/30/2006   Pertinent  Health Maintenance Due  Topic Date Due   INFLUENZA VACCINE  02/18/2023      04/21/2021   10:34 AM 08/25/2021   11:46 AM 07/01/2022   11:58 AM 07/29/2022    9:40 AM 09/21/2022    3:05 PM  Fall Risk  Falls in the past year?  1 1 0 1  Was there an injury with Fall?  0 0 0 0  Fall Risk Category Calculator  1 1 0 1  Fall Risk Category (Retired)  Low Low Low   (RETIRED) Patient Fall Risk Level Moderate fall risk Moderate fall risk Moderate fall risk Moderate fall risk   Patient at Risk for Falls Due to  History of fall(s);Impaired balance/gait;Impaired mobility History of fall(s);Impaired balance/gait;Impaired mobility History of fall(s);Impaired balance/gait;Impaired mobility History of fall(s);Impaired balance/gait;Impaired mobility  Fall risk Follow up  Falls evaluation completed;Education provided Falls evaluation completed Falls evaluation completed Falls evaluation completed;Falls prevention discussed;Education provided  Functional Status Survey:    Vitals:   04/15/23 1416  BP: (!) 154/81  Pulse: 66  Resp: (!) 22  Temp: 98.7 F (37.1 C)  SpO2: 97%  Weight: 157 lb 9.6 oz (71.5 kg)  Height: 5\' 7"  (1.702 m)   Body mass index is 24.68 kg/m. Physical Exam Vitals reviewed.  Constitutional:      Appearance: Normal appearance.  HENT:     Head: Normocephalic.     Nose: Nose normal.     Mouth/Throat:     Mouth: Mucous membranes are moist.     Pharynx: Oropharynx is clear.  Eyes:     Pupils: Pupils are equal, round, and reactive to light.  Cardiovascular:     Rate and Rhythm: Normal rate and regular rhythm.     Pulses: Normal pulses.     Heart sounds: No murmur heard. Pulmonary:     Effort: Pulmonary effort is normal. No respiratory distress.     Breath sounds: Normal breath sounds. No rales.   Abdominal:     General: Abdomen is flat. Bowel sounds are normal.     Palpations: Abdomen is soft.  Musculoskeletal:        General: No swelling.     Cervical back: Neck supple.  Skin:    General: Skin is warm.  Neurological:     General: No focal deficit present.     Mental Status: He is alert.     Comments: Aphasia worsening  Psychiatric:        Mood and Affect: Mood normal.        Thought Content: Thought content normal.     Labs reviewed: Recent Labs    09/28/22 0000  NA 27*  K 4.4  CL 107  CO2 27*  BUN 26*  CREATININE 1.0  CALCIUM 8.4*   Recent Labs    09/28/22 0000  AST 17  ALT 15  ALKPHOS 39  ALBUMIN 3.2*   Recent Labs    09/28/22 0000  WBC 6.7  NEUTROABS 4,094.00  HGB 14.3  HCT 40*  PLT 190   Lab Results  Component Value Date   TSH 2.28 09/28/2022   No results found for: "HGBA1C" Lab Results  Component Value Date   CHOL 116 09/28/2022   HDL 26 (A) 09/28/2022   LDLCALC 73 09/28/2022   TRIG 91 09/28/2022   CHOLHDL 2.9 05/09/2020    Significant Diagnostic Results in last 30 days:  No results found.  Assessment/Plan 1. Essential hypertension Norvasc changed due to Syncope and low BP BP acceptable for him On Vasotec 2. 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 Hyperlipidemia, unspecified hyperlipidemia type On statin  4. Depression, recurrent (HCC) Zoloft 5Vascular dementia On aspirin and Statin Worsening Managing in [place]   Family/ staff Communication:   Labs/tests ordered:  CBC,CMP,Lipid

## 2023-04-26 LAB — BASIC METABOLIC PANEL
BUN: 19 (ref 4–21)
CO2: 28 — AB (ref 13–22)
Chloride: 106 (ref 99–108)
Creatinine: 1 (ref 0.6–1.3)
Glucose: 101
Potassium: 4.1 meq/L (ref 3.5–5.1)
Sodium: 139 (ref 137–147)

## 2023-04-26 LAB — CBC: RBC: 4.3 (ref 3.87–5.11)

## 2023-04-26 LAB — CBC AND DIFFERENTIAL
HCT: 40 — AB (ref 41–53)
Hemoglobin: 13.6 (ref 13.5–17.5)
Platelets: 188 10*3/uL (ref 150–400)
WBC: 6.9

## 2023-04-26 LAB — LIPID PANEL
Cholesterol: 97 (ref 0–200)
HDL: 30 — AB (ref 35–70)
LDL Cholesterol: 51
Triglycerides: 76 (ref 40–160)

## 2023-04-26 LAB — COMPREHENSIVE METABOLIC PANEL
Albumin: 3.1 — AB (ref 3.5–5.0)
Calcium: 8.5 — AB (ref 8.7–10.7)
Globulin: 1.7
eGFR: 73

## 2023-04-26 LAB — HEPATIC FUNCTION PANEL
ALT: 11 U/L (ref 10–40)
AST: 17 (ref 14–40)
Alkaline Phosphatase: 51 (ref 25–125)
Bilirubin, Total: 0.5

## 2023-06-25 ENCOUNTER — Encounter: Payer: Self-pay | Admitting: Orthopedic Surgery

## 2023-06-25 ENCOUNTER — Non-Acute Institutional Stay: Payer: Medicare Other | Admitting: Orthopedic Surgery

## 2023-06-25 DIAGNOSIS — I10 Essential (primary) hypertension: Secondary | ICD-10-CM

## 2023-06-25 DIAGNOSIS — M545 Low back pain, unspecified: Secondary | ICD-10-CM

## 2023-06-25 DIAGNOSIS — I69919 Unspecified symptoms and signs involving cognitive functions following unspecified cerebrovascular disease: Secondary | ICD-10-CM | POA: Diagnosis not present

## 2023-06-25 DIAGNOSIS — G4700 Insomnia, unspecified: Secondary | ICD-10-CM

## 2023-06-25 DIAGNOSIS — F339 Major depressive disorder, recurrent, unspecified: Secondary | ICD-10-CM

## 2023-06-25 DIAGNOSIS — G459 Transient cerebral ischemic attack, unspecified: Secondary | ICD-10-CM

## 2023-06-25 DIAGNOSIS — G8929 Other chronic pain: Secondary | ICD-10-CM

## 2023-06-25 DIAGNOSIS — E782 Mixed hyperlipidemia: Secondary | ICD-10-CM | POA: Diagnosis not present

## 2023-06-25 DIAGNOSIS — K5904 Chronic idiopathic constipation: Secondary | ICD-10-CM

## 2023-06-25 NOTE — Progress Notes (Signed)
Location:  [facility] Room Number: 29-A Place of Service:  ALF [phone]) Provider:  Priscella Mann, MD  Patient Care Team: Mahlon Gammon, MD as PCP - General (Internal Medicine) Shelva Majestic, [facility] Campbell Stall, MD as Consulting Physician (Dermatology) Holli Humbles, MD as Referring Physician (Ophthalmology) Bond, Doran Stabler, MD as Referring Physician (Ophthalmology) Mahlon Gammon, MD as Referring Physician (Internal Medicine)  Extended Emergency Contact Information Primary Emergency Contact: [name] of [place] Home Phone: [phone] Relation: Son  Code Status:  DNR Goals of care: Advanced Directive information    06/25/2023    9:45 AM  Advanced Directives  Does Patient Have a Medical Advance Directive? Yes  Type of Estate agent of [place];Living will;Out of facility DNR (pink MOST or yellow form)  Does patient want to make changes to medical advance directive? No - Patient declined  Copy of Healthcare Power of Attorney in Chart? Yes - validated most recent copy scanned in chart (See row information)  Pre-existing out of facility DNR order (yellow form or pink MOST form) Yellow form placed in chart (order not valid for inpatient use)     Chief Complaint  Patient presents with   Medical Management of Chronic Issues    Routine visit     HPI:  Pt is a 89 y.o. male seen today for medical management of chronic diseases.    He currently resides on the assisted living unit at [facility]. PMH: HTN, HLD, TIA, mixed hearing loss, actinic keratosis, glaucoma, joint pain, and depression.    HTN- BUN/creat 19/1.0 04/26/2023, remains on enalapril and amlodipine HLD- LDL 73 09/28/2022, remains on Lipitor TIA- last episode reported 10/18/2012, remains on asa and statin Cognitive deficit- MMSE 11/30 09/25/2022, MRI brain 12/2017 revealed age related cerebral atrophy with moderate to  advanced chronic small vessel ischemic disease, no behaviors, ambulates with walker/wheelchair, some days needs more cueing than others, not on medication GERD- hgb 13.6 04/26/2023, remains on omeprazole Chronic back pain-  remains on tylenol prn started Insomnia- remains on melatonin Depression- unsuccessful trial of Wellbutrin (increased anxiety), Na+ 139 04/26/2023, remains on Zoloft Constipation- remains on senna  No recent falls or injuries.   Recent weights:  12/01- 154.4 lbs  11/01- 154.4 lbs  10/01- 156 lbs  Recent blood pressures:  12/04- 134/79, 214/143  11/27- 129/81  11/20- 149/80     Past Medical History:  Diagnosis Date   Actinic keratosis 10/18/2012   Anisocoria 10/30/2014   Left pupil is larger than the right postsurgery corneal transplant.    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/18/2012   Deafness 10/18/2012   Endothelial corneal dystrophy    Essential hypertension 10/18/2012   Hammer toe 04/24/2014   Hyperlipidemia 10/18/2012   Intermediate stage nonexudative age-related macular degeneration of both eyes 04/16/2016   Nocturia 05/19/2016   Occlusion and stenosis of carotid artery with cerebral infarction    Other specified cardiac dysrhythmias(427.89)    Pain in joint, pelvic region and thigh    Paresthesia 04/30/2015   Toes of both feet    Peripheral vascular disease, unspecified (HCC)    Primary open angle glaucoma 09/25/2011   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   Rosacea    Seborrheic keratosis 04/24/2014   Syncope 06/27/2013   TIA (transient ischemic attack) 10/18/2012   States he has had about 5  times   Urine frequency    Past Surgical History:  Procedure Laterality Date   CATARACT EXTRACTION  2005   bilateral   CORNEAL TRANSPLANT  2010 left; 2013 right eye   HERNIA REPAIR  1985   Left   HERNIA REPAIR  03/2004   Right   SHOULDER DEBRIDEMENT Left  2005   TONSILLECTOMY Bilateral childhood   TRANSURETHRAL RESECTION OF PROSTATE N/A 10/2003   VASECTOMY      No Known Allergies  Outpatient Encounter Medications as of 06/25/2023  Medication Sig   acetaminophen (TYLENOL) 500 MG tablet Take 1,000 mg by mouth 3 (three) times daily as needed for mild pain or moderate pain.   amLODipine (NORVASC) 2.5 MG tablet Take 2.5 mg by mouth daily.   ammonium lactate (LAC-HYDRIN) 12 % lotion Apply 1 application. topically daily.   aspirin 325 MG tablet Take 325 mg by mouth every evening.   atorvastatin (LIPITOR) 40 MG tablet TAKE 1 TABLET DAILY   camphor-menthol (SARNA) lotion Apply 1 Application topically as needed for itching.   dextromethorphan-guaiFENesin (ROBITUSSIN-DM) 10-100 MG/5ML liquid Take 5 mLs by mouth every 6 (six) hours as needed for cough.   docusate sodium (COLACE) 100 MG capsule Take 100 mg by mouth 2 (two) times daily.   DORZOLAMIDE HCL-TIMOLOL MAL OP Place 1 drop into both eyes 2 (two) times daily.   enalapril (VASOTEC) 20 MG tablet Take 20 mg by mouth in the morning.   lactose free nutrition (BOOST) LIQD Take 237 mLs by mouth daily.   latanoprost (XALATAN) 0.005 % ophthalmic solution Place 1 drop into the left eye daily.   loratadine (CLARITIN) 10 MG tablet Take 10 mg by mouth daily.   melatonin 3 MG TABS tablet Take 3 mg by mouth at bedtime. 8pm   Multiple Vitamins-Minerals (MULTIVITAMIN ADULTS PO) Take 1 capsule by mouth every morning.   omeprazole (PRILOSEC) 20 MG capsule Take 20 mg by mouth daily.   senna-docusate (SENOKOT-S) 8.6-50 MG tablet Take 1 tablet by mouth daily.   sertraline (ZOLOFT) 50 MG tablet Take 50 mg by mouth daily.   zinc oxide 20 % ointment Apply 1 Application topically as needed for irritation.   No facility-administered encounter medications on file as of 06/25/2023.    Review of Systems  Unable to perform ROS: Other    Immunization History  Administered Date(s) Administered   Influenza Whole  05/03/2002, 04/19/2012, 04/20/2013   Influenza, High Dose Seasonal PF 05/03/2019, 05/13/2022, 05/18/2023   Influenza,inj,Quad PF,6+ Mos 04/21/2018   Influenza-Unspecified 05/03/2014, 04/18/2015, 04/30/2016, 04/18/2017, 04/09/2020, 05/13/2021   Moderna Covid-19 Vaccine Bivalent Booster 37yrs & up 05/26/2023   Moderna SARS-COV2 Booster Vaccination 12/25/2020   Moderna Sars-Covid-2 Vaccination 07/24/2019, 08/21/2019, 06/03/2020, 12/31/2021, 05/26/2022   Pfizer Covid-19 Vaccine Bivalent Booster 32yrs & up 04/09/2021   Pneumococcal Conjugate-13 03/20/2010   Pneumococcal Polysaccharide-23 11/02/2017   Td 02/24/2018   Tdap 06/27/2011   Zoster Recombinant(Shingrix) 12/24/2017, 03/09/2018   Zoster, Live 11/30/2006   Pertinent  Health Maintenance Due  Topic Date Due   INFLUENZA VACCINE  Completed      04/21/2021   10:34 AM 08/25/2021   11:46 AM 07/01/2022   11:58 AM 07/29/2022    9:40 AM 09/21/2022    3:05 PM  Fall Risk  Falls in the past year?  1 1 0 1  Was there an injury with Fall?  0 0 0 0  Fall Risk Category Calculator  1 1 0 1  Fall Risk Category (Retired)  Low Low Low   (  RETIRED) Patient Fall Risk Level Moderate fall risk Moderate fall risk Moderate fall risk Moderate fall risk   Patient at Risk for Falls Due to  History of fall(s);Impaired balance/gait;Impaired mobility History of fall(s);Impaired balance/gait;Impaired mobility History of fall(s);Impaired balance/gait;Impaired mobility History of fall(s);Impaired balance/gait;Impaired mobility  Fall risk Follow up  Falls evaluation completed;Education provided Falls evaluation completed Falls evaluation completed Falls evaluation completed;Falls prevention discussed;Education provided   Functional Status Survey:    Vitals:   06/25/23 0941  BP: 134/79  Pulse: 80  Resp: 13  Temp: (!) 97.4 F (36.3 C)  SpO2: 96%  Weight: 154 lb 6.4 oz (70 kg)  Height: 5\' 7"  (1.702 m)   Body mass index is 24.18 kg/m. Physical Exam Vitals  reviewed.  Constitutional:      General: He is not in acute distress. HENT:     Head: Normocephalic.     Right Ear: There is no impacted cerumen.     Left Ear: There is no impacted cerumen.     Nose: Nose normal.     Mouth/Throat:     Mouth: Mucous membranes are moist.  Eyes:     General:        Right eye: No discharge.        Left eye: No discharge.  Cardiovascular:     Rate and Rhythm: Normal rate and regular rhythm.     Pulses: Normal pulses.     Heart sounds: Normal heart sounds.  Pulmonary:     Effort: Pulmonary effort is normal. No respiratory distress.     Breath sounds: Normal breath sounds. No wheezing or rales.  Abdominal:     General: Bowel sounds are normal.     Palpations: Abdomen is soft.  Musculoskeletal:     Cervical back: Neck supple.     Right lower leg: No edema.     Left lower leg: No edema.  Skin:    General: Skin is warm.     Capillary Refill: Capillary refill takes less than 2 seconds.  Neurological:     General: No focal deficit present.     Mental Status: He is alert. Mental status is at baseline.     Motor: Weakness present.     Gait: Gait abnormal.     Comments: Walker/wheelchair  Psychiatric:        Mood and Affect: Mood normal.     Labs reviewed: Recent Labs    09/28/22 0000 04/26/23 0000  NA 27* 139  K 4.4 4.1  CL 107 106  CO2 27* 28*  BUN 26* 19  CREATININE 1.0 1.0  CALCIUM 8.4* 8.5*   Recent Labs    09/28/22 0000 04/26/23 0000  AST 17 17  ALT 15 11  ALKPHOS 39 51  ALBUMIN 3.2* 3.1*   Recent Labs    09/28/22 0000 04/26/23 0000  WBC 6.7 6.9  NEUTROABS 4,094.00  --   HGB 14.3 13.6  HCT 40* 40*  PLT 190 188   Lab Results  Component Value Date   TSH 2.28 09/28/2022   No results found for: "HGBA1C" Lab Results  Component Value Date   CHOL 97 04/26/2023   HDL 30 (A) 04/26/2023   LDLCALC 51 04/26/2023   TRIG 76 04/26/2023   CHOLHDL 2.9 05/09/2020    Significant Diagnostic Results in last 30 days:  No  results found.  Assessment/Plan 1. Essential hypertension - controlled - cont amlodipine and enalapril  2. Mixed hyperlipidemia - cont atorvastatin  3. TIA (transient ischemic  attack) - no recent episodes - cont aspirin and atorvastatin - on omeprazole for GI protection  4. 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 Chronic midline low back pain without sciatica - stable with tylenol   6. Insomnia, unspecified type - cont melatonin  7. Depression, recurrent (HCC) - no mood changes - cont Zoloft  8. Chronic idiopathic constipation - abdomen soft - cont senna      Family/ staff Communication: plan discussed with patient and nurse  Labs/tests ordered:  none

## 2023-09-21 ENCOUNTER — Encounter: Payer: Self-pay | Admitting: Orthopedic Surgery

## 2023-09-21 ENCOUNTER — Non-Acute Institutional Stay: Payer: Self-pay | Admitting: Orthopedic Surgery

## 2023-09-21 DIAGNOSIS — R112 Nausea with vomiting, unspecified: Secondary | ICD-10-CM

## 2023-09-21 DIAGNOSIS — Z7189 Other specified counseling: Secondary | ICD-10-CM | POA: Diagnosis not present

## 2023-09-21 LAB — CBC AND DIFFERENTIAL
HCT: 43 (ref 41–53)
Hemoglobin: 14.1 (ref 13.5–17.5)
Platelets: 186 K/uL (ref 150–400)
WBC: 19.3

## 2023-09-21 LAB — BASIC METABOLIC PANEL WITH GFR
BUN: 24 — AB (ref 4–21)
CO2: 23 — AB (ref 13–22)
Chloride: 106 (ref 99–108)
Creatinine: 1.1 (ref 0.6–1.3)
Glucose: 180
Potassium: 3.9 meq/L (ref 3.5–5.1)
Sodium: 140 (ref 137–147)

## 2023-09-21 LAB — COMPREHENSIVE METABOLIC PANEL WITH GFR
Albumin: 3.6 (ref 3.5–5.0)
Calcium: 8.7 (ref 8.7–10.7)
Globulin: 2
eGFR: 62

## 2023-09-21 LAB — HEPATIC FUNCTION PANEL
ALT: 12 U/L (ref 10–40)
AST: 20 (ref 14–40)
Alkaline Phosphatase: 54 (ref 25–125)
Bilirubin, Total: 1

## 2023-09-21 LAB — CBC: RBC: 4.54 (ref 3.87–5.11)

## 2023-09-21 MED ORDER — CEFTRIAXONE SODIUM 2 G IJ SOLR: 2.0000 g | Freq: Once | INTRAMUSCULAR | Status: AC

## 2023-09-21 NOTE — Progress Notes (Signed)
 Location:  [facility] Room Number: 29A Place of Service:  ALF [phone]) Provider:  Priscella Mann, MD  Patient Care Team: Mahlon Gammon, MD as PCP - General (Internal Medicine) Shelva Majestic, [facility] Campbell Stall, MD as Consulting Physician (Dermatology) Holli Humbles, MD as Referring Physician (Ophthalmology) Bond, Doran Stabler, MD as Referring Physician (Ophthalmology) Mahlon Gammon, MD as Referring Physician (Internal Medicine)  Extended Emergency Contact Information Primary Emergency Contact: Dianna [place] of [place] Home Phone: [phone] Relation: Son  Code Status:  DNR Goals of care: Advanced Directive information    09/21/2023    9:48 AM  Advanced Directives  Does Patient Have a Medical Advance Directive? Yes  Type of Estate agent of [place];Living will;Out of facility DNR (pink MOST or yellow form)  Does patient want to make changes to medical advance directive? No - Patient declined  Copy of Healthcare Power of Attorney in Chart? Yes - validated most recent copy scanned in chart (See row information)  Pre-existing out of facility DNR order (yellow form or pink MOST form) Pink MOST/Yellow Form most recent copy in chart - Physician notified to receive inpatient order     Chief Complaint  Patient presents with   Acute Visit    Nausea and vomiting.    HPI:  Pt is a 89 y.o. male seen today for an acute visit for nausea and vomiting.   He currently resides on the assisted living unit at [facility]. PMH: HTN, HLD, TIA, mixed hearing loss, actinic keratosis, glaucoma, joint pain, and depression.   03/04 he had 6 episodes on dark brown emesis. Nursing describes emesis as smelling like stool. Nursing also performed covid test which was negative. He is a poor historian due to cognitive deficit. He denies abdominal pain. He was able to eat breakfast, but did not eat lunch. He normally has  daily bowel movements per nursing. No BM today. Afebrile. Vitals stable.   Discussed treatment plans with son, [name]. He agrees to workup in AL. No future hospitalizations. If condition worsens, proceed with comfort measures.   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/18/2012   Deafness 10/18/2012   Endothelial corneal dystrophy    Essential hypertension 10/18/2012   Hammer toe 04/24/2014   Hyperlipidemia 10/18/2012   Intermediate stage nonexudative age-related macular degeneration of both eyes 04/16/2016   Nocturia 05/19/2016   Occlusion and stenosis of carotid artery with cerebral infarction    Other specified cardiac dysrhythmias(427.89)    Pain in joint, pelvic region and thigh    Paresthesia 04/30/2015   Toes of both feet    Peripheral vascular disease, unspecified (HCC)    Primary open angle glaucoma 09/25/2011   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   Rosacea    Seborrheic keratosis 04/24/2014   Syncope 06/27/2013   TIA (transient ischemic attack) 10/18/2012   States he has had about 5  times   Urine frequency    Past Surgical History:  Procedure Laterality Date   CATARACT EXTRACTION  2005   bilateral   CORNEAL TRANSPLANT  2010 left; 2013 right eye   HERNIA REPAIR  1985   Left   HERNIA REPAIR  03/2004  Right   SHOULDER DEBRIDEMENT Left 2005   TONSILLECTOMY Bilateral childhood   TRANSURETHRAL RESECTION OF PROSTATE N/A 10/2003   VASECTOMY      No Known Allergies  Outpatient Encounter Medications as of 09/21/2023  Medication Sig   acetaminophen (TYLENOL) 500 MG tablet Take 1,000 mg by mouth 3 (three) times daily as needed for mild pain or moderate pain.   amLODipine (NORVASC) 2.5 MG tablet Take 2.5 mg by mouth  daily.   ammonium lactate (LAC-HYDRIN) 12 % lotion Apply 1 application. topically daily.   aspirin 325 MG tablet Take 325 mg by mouth every evening.   atorvastatin (LIPITOR) 40 MG tablet TAKE 1 TABLET DAILY   camphor-menthol (SARNA) lotion Apply 1 Application topically as needed for itching.   dextromethorphan-guaiFENesin (ROBITUSSIN-DM) 10-100 MG/5ML liquid Take 5 mLs by mouth every 6 (six) hours as needed for cough.   docusate sodium (COLACE) 100 MG capsule Take 100 mg by mouth 2 (two) times daily.   DORZOLAMIDE HCL-TIMOLOL MAL OP Place 1 drop into both eyes 2 (two) times daily.   enalapril (VASOTEC) 20 MG tablet Take 20 mg by mouth in the morning.   lactose free nutrition (BOOST) LIQD Take 237 mLs by mouth daily.   latanoprost (XALATAN) 0.005 % ophthalmic solution Place 1 drop into the left eye daily.   loratadine (CLARITIN) 10 MG tablet Take 10 mg by mouth daily.   melatonin 3 MG TABS tablet Take 3 mg by mouth at bedtime. 8pm   Multiple Vitamins-Minerals (MULTIVITAMIN ADULTS PO) Take 1 capsule by mouth every morning.   omeprazole (PRILOSEC) 20 MG capsule Take 20 mg by mouth daily.   promethazine (PHENERGAN) 12.5 MG tablet Take 12.5 mg by mouth every 6 (six) hours. For nausea and vomiting.   promethazine (PHENERGAN) 25 MG/ML injection Inject 25 mg into the muscle every 6 (six) hours as needed for nausea or vomiting.   senna-docusate (SENOKOT-S) 8.6-50 MG tablet Take 1 tablet by mouth daily.   sertraline (ZOLOFT) 50 MG tablet Take 50 mg by mouth daily.   zinc oxide 20 % ointment Apply 1 Application topically as needed for irritation.   No facility-administered encounter medications on file as of 09/21/2023.    Review of Systems  Unable to perform ROS: Dementia    Immunization History  Administered Date(s) Administered   Influenza Whole 05/03/2002, 04/19/2012, 04/20/2013   Influenza, High Dose Seasonal PF 05/03/2019, 05/13/2022, 05/18/2023   Influenza,inj,Quad PF,6+ Mos 04/21/2018    Influenza-Unspecified 05/03/2014, 04/18/2015, 04/30/2016, 04/18/2017, 04/09/2020, 05/13/2021   Moderna Covid-19 Vaccine Bivalent Booster 90yrs & up 05/26/2023   Moderna SARS-COV2 Booster Vaccination 12/25/2020   Moderna Sars-Covid-2 Vaccination 07/24/2019, 08/21/2019, 06/03/2020, 12/31/2021, 05/26/2022   Pfizer Covid-19 Vaccine Bivalent Booster 45yrs & up 04/09/2021   Pneumococcal Conjugate-13 03/20/2010   Pneumococcal Polysaccharide-23 11/02/2017   Td 02/24/2018   Tdap 06/27/2011   Zoster Recombinant(Shingrix) 12/24/2017, 03/09/2018   Zoster, Live 11/30/2006   Pertinent  Health Maintenance Due  Topic Date Due   INFLUENZA VACCINE  Completed      08/25/2021   11:46 AM 07/01/2022   11:58 AM 07/29/2022    9:40 AM 09/21/2022    3:05 PM 06/25/2023    2:13 PM  Fall Risk  Falls in the past year? 1 1 0 1 0  Was there an injury with Fall? 0 0 0 0 0  Fall Risk Category Calculator 1 1 0 1 0  Fall Risk Category (Retired) Low Low Low    (RETIRED) Patient Fall Risk Level  Moderate fall risk Moderate fall risk Moderate fall risk    Patient at Risk for Falls Due to History of fall(s);Impaired balance/gait;Impaired mobility History of fall(s);Impaired balance/gait;Impaired mobility History of fall(s);Impaired balance/gait;Impaired mobility History of fall(s);Impaired balance/gait;Impaired mobility History of fall(s);Impaired balance/gait;Impaired mobility  Fall risk Follow up Falls evaluation completed;Education provided Falls evaluation completed Falls evaluation completed Falls evaluation completed;Falls prevention discussed;Education provided Falls evaluation completed;Education provided;Falls prevention discussed   Functional Status Survey:    Vitals:   09/21/23 0947  BP: 118/80  Pulse: 74  Resp: 18  Temp: 97.9 F (36.6 C)  SpO2: 96%  Weight: 152 lb 6.4 oz (69.1 kg)  Height: 5\' 7"  (1.702 m)   Body mass index is 23.87 kg/m. Physical Exam Vitals reviewed.  Constitutional:      General:  He is not in acute distress.    Appearance: He is ill-appearing.  HENT:     Head: Normocephalic.     Nose: Nose normal.     Mouth/Throat:     Mouth: Mucous membranes are moist.  Eyes:     General:        Right eye: No discharge.        Left eye: No discharge.  Cardiovascular:     Rate and Rhythm: Normal rate and regular rhythm.     Pulses: Normal pulses.     Heart sounds: Normal heart sounds.  Pulmonary:     Effort: Pulmonary effort is normal. No respiratory distress.     Breath sounds: Examination of the right-middle field reveals decreased breath sounds. Examination of the left-middle field reveals decreased breath sounds. Decreased breath sounds present. No wheezing.  Abdominal:     General: There is no distension.     Palpations: Abdomen is soft.     Tenderness: There is no abdominal tenderness. There is no guarding.     Comments: LLQ and RLQ hypoactive  Musculoskeletal:     Cervical back: Neck supple.     Right lower leg: No edema.     Left lower leg: No edema.  Lymphadenopathy:     Cervical: No cervical adenopathy.  Skin:    General: Skin is warm.     Capillary Refill: Capillary refill takes less than 2 seconds.  Neurological:     General: No focal deficit present.     Mental Status: He is alert. Mental status is at baseline.     Motor: Weakness present.     Gait: Gait abnormal.  Psychiatric:        Mood and Affect: Mood normal.     Labs reviewed: Recent Labs    09/28/22 0000 04/26/23 0000  NA 27* 139  K 4.4 4.1  CL 107 106  CO2 27* 28*  BUN 26* 19  CREATININE 1.0 1.0  CALCIUM 8.4* 8.5*   Recent Labs    09/28/22 0000 04/26/23 0000  AST 17 17  ALT 15 11  ALKPHOS 39 51  ALBUMIN 3.2* 3.1*   Recent Labs    09/28/22 0000 04/26/23 0000  WBC 6.7 6.9  NEUTROABS 4,094.00  --   HGB 14.3 13.6  HCT 40* 40*  PLT 190 188   Lab Results  Component Value Date   TSH 2.28 09/28/2022   No results found for: "HGBA1C" Lab Results  Component Value Date    CHOL 97 04/26/2023   HDL 30 (A) 04/26/2023   LDLCALC 51 04/26/2023   TRIG 76 04/26/2023   CHOLHDL 2.9 05/09/2020    Significant Diagnostic Results in  last 30 days:  No results found.  Assessment/Plan 1. Nausea and vomiting, unspecified vomiting type (Primary) - 6 episodes of dark foul smelling emesis  - no abdominal distension or pain, LLQ/RLQ hypoactive, middle lobes diminished  - h/o hernia repair 2005 - concerns for obstruction/ aspiration PNA - stat cbc/diff> WBC 19.3, hgb 14.1, hct 42.6, platelets 186 - stat cmp> BUN/creat 24/1.10, Na 140, K 3.9, alk phos 54, bilirubin 1.0, AST/ALT 20/12 - stat CXR - stat KUB - stat UA/culture  - will give one dose Rocephin due to elevated white count - cefTRIAXone (ROCEPHIN) 2 g injection; Inject 2 g into the muscle once for 1 dose.  2. Advanced directives, counseling/discussion - see above - patient DNR - MOST form completed> no hospitalizations - if condition worsens> proceed with comfort measures    Family/ staff Communication: plan discussed with nursing, son and patient  Labs/tests ordered:  stat cbc/diff, cmp, UA/culture, CXR and KUB

## 2023-09-22 ENCOUNTER — Non-Acute Institutional Stay: Payer: Self-pay | Admitting: Orthopedic Surgery

## 2023-09-22 ENCOUNTER — Encounter: Payer: Self-pay | Admitting: Orthopedic Surgery

## 2023-09-22 DIAGNOSIS — Z515 Encounter for palliative care: Secondary | ICD-10-CM

## 2023-09-22 DIAGNOSIS — D72829 Elevated white blood cell count, unspecified: Secondary | ICD-10-CM

## 2023-09-22 MED ORDER — MORPHINE SULFATE (CONCENTRATE) 20 MG/ML PO SOLN: 5.0000 mg | ORAL | 0 refills | Status: DC | PRN

## 2023-09-22 MED ORDER — LORAZEPAM 0.5 MG PO TABS: 0.5000 mg | ORAL_TABLET | ORAL | 0 refills | Status: DC | PRN

## 2023-09-22 NOTE — Progress Notes (Signed)
 Location:  [facility] Room Number: 29/A Place of Service:  ALF [phone]) Provider:  Octavia Heir, NP   Mahlon Gammon, MD  Patient Care Team: Mahlon Gammon, MD as PCP - General (Internal Medicine) Shelva Majestic, [facility] Campbell Stall, MD as Consulting Physician (Dermatology) Holli Humbles, MD as Referring Physician (Ophthalmology) Bond, Doran Stabler, MD as Referring Physician (Ophthalmology) Mahlon Gammon, MD as Referring Physician (Internal Medicine)  Extended Emergency Contact Information Primary Emergency Contact: [name] of [place] Home Phone: [phone] Relation: Son  Code Status:  DNR Goals of care: Advanced Directive information    09/21/2023    9:48 AM  Advanced Directives  Does Patient Have a Medical Advance Directive? Yes  Type of Estate agent of [place];Living will;Out of facility DNR (pink MOST or yellow form)  Does patient want to make changes to medical advance directive? No - Patient declined  Copy of Healthcare Power of Attorney in Chart? Yes - validated most recent copy scanned in chart (See row information)  Pre-existing out of facility DNR order (yellow form or pink MOST form) Pink MOST/Yellow Form most recent copy in chart - Physician notified to receive inpatient order     Chief Complaint  Patient presents with   Acute Visit    End of life care    HPI:  Pt is a 89 y.o. male seen today for acute visit due to failure to thrive.   He currently resides on the assisted living unit at [facility]. PMH: HTN, HLD, TIA, mixed hearing loss, actinic keratosis, glaucoma, joint pain, and depression.   03/04 he had 6 episodes of dark brown emesis. KUB negative for occlusion, moderate stool in rectal vault. CXR negative for infiltrates or effusion. WBC was 19.3, other labs unremarkable. He was given Rocephin 2g IM once. This morning he is only responsive to touch. Nonverbal. He does not follow  commands. Nursing was unable to obtain urine sample for UA/culture. He is not drinking. Did not eat breakfast this morning. Treatment options discussed with son, [name]. He agrees to comfort care interventions.    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/18/2012   Deafness 10/18/2012   Endothelial corneal dystrophy    Essential hypertension 10/18/2012   Hammer toe 04/24/2014   Hyperlipidemia 10/18/2012   Intermediate stage nonexudative age-related macular degeneration of both eyes 04/16/2016   Nocturia 05/19/2016   Occlusion and stenosis of carotid artery with cerebral infarction    Other specified cardiac dysrhythmias(427.89)    Pain in joint, pelvic region and thigh    Paresthesia 04/30/2015   Toes of both feet    Peripheral vascular disease, unspecified (HCC)    Primary open angle glaucoma 09/25/2011   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   Rosacea    Seborrheic keratosis 04/24/2014   Syncope 06/27/2013   TIA (transient ischemic attack) 10/18/2012   States he has had about 5  times   Urine frequency    Past Surgical History:  Procedure Laterality Date   CATARACT EXTRACTION  2005   bilateral   CORNEAL TRANSPLANT  2010 left; 2013 right eye   HERNIA REPAIR  1985   Left   HERNIA REPAIR  03/2004  Right   SHOULDER DEBRIDEMENT Left 2005   TONSILLECTOMY Bilateral childhood   TRANSURETHRAL RESECTION OF PROSTATE N/A 10/2003   VASECTOMY      No Known Allergies  Outpatient Encounter Medications as of 09/22/2023  Medication Sig   acetaminophen (TYLENOL) 500 MG tablet Take 1,000 mg by mouth 3 (three) times daily as needed for mild pain or moderate pain.   amLODipine (NORVASC) 2.5 MG tablet Take 2.5 mg by mouth daily.   ammonium  lactate (LAC-HYDRIN) 12 % lotion Apply 1 application. topically daily.   aspirin 325 MG tablet Take 325 mg by mouth every evening.   atorvastatin (LIPITOR) 40 MG tablet TAKE 1 TABLET DAILY   camphor-menthol (SARNA) lotion Apply 1 Application topically as needed for itching.   dextromethorphan-guaiFENesin (ROBITUSSIN-DM) 10-100 MG/5ML liquid Take 5 mLs by mouth every 6 (six) hours as needed for cough.   docusate sodium (COLACE) 100 MG capsule Take 100 mg by mouth 2 (two) times daily.   DORZOLAMIDE HCL-TIMOLOL MAL OP Place 1 drop into both eyes 2 (two) times daily.   enalapril (VASOTEC) 20 MG tablet Take 20 mg by mouth in the morning.   lactose free nutrition (BOOST) LIQD Take 237 mLs by mouth daily.   latanoprost (XALATAN) 0.005 % ophthalmic solution Place 1 drop into the left eye daily.   loratadine (CLARITIN) 10 MG tablet Take 10 mg by mouth daily.   melatonin 3 MG TABS tablet Take 3 mg by mouth at bedtime. 8pm   Multiple Vitamins-Minerals (MULTIVITAMIN ADULTS PO) Take 1 capsule by mouth every morning.   omeprazole (PRILOSEC) 20 MG capsule Take 20 mg by mouth daily.   promethazine (PHENERGAN) 12.5 MG tablet Take 12.5 mg by mouth every 6 (six) hours. For nausea and vomiting.   promethazine (PHENERGAN) 25 MG/ML injection Inject 25 mg into the muscle every 6 (six) hours as needed for nausea or vomiting.   senna-docusate (SENOKOT-S) 8.6-50 MG tablet Take 1 tablet by mouth daily.   sertraline (ZOLOFT) 50 MG tablet Take 50 mg by mouth daily.   zinc oxide 20 % ointment Apply 1 Application topically as needed for irritation.   No facility-administered encounter medications on file as of 09/22/2023.    Review of Systems  Unable to perform ROS: Dementia    Immunization History  Administered Date(s) Administered   Influenza Whole 05/03/2002, 04/19/2012, 04/20/2013   Influenza, High Dose Seasonal PF 05/03/2019, 05/13/2022, 05/18/2023   Influenza,inj,Quad PF,6+ Mos 04/21/2018    Influenza-Unspecified 05/03/2014, 04/18/2015, 04/30/2016, 04/18/2017, 04/09/2020, 05/13/2021   Moderna Covid-19 Vaccine Bivalent Booster 2yrs & up 05/26/2023   Moderna SARS-COV2 Booster Vaccination 12/25/2020   Moderna Sars-Covid-2 Vaccination 07/24/2019, 08/21/2019, 06/03/2020, 12/31/2021, 05/26/2022   Pfizer Covid-19 Vaccine Bivalent Booster 50yrs & up 04/09/2021   Pneumococcal Conjugate-13 03/20/2010   Pneumococcal Polysaccharide-23 11/02/2017   Td 02/24/2018   Tdap 06/27/2011   Zoster Recombinant(Shingrix) 12/24/2017, 03/09/2018   Zoster, Live 11/30/2006   Pertinent  Health Maintenance Due  Topic Date Due   INFLUENZA VACCINE  Completed      08/25/2021   11:46 AM 07/01/2022   11:58 AM 07/29/2022    9:40 AM 09/21/2022    3:05 PM 06/25/2023    2:13 PM  Fall Risk  Falls in the past year? 1 1 0 1 0  Was there an injury with Fall? 0 0 0 0 0  Fall Risk Category Calculator 1 1 0 1 0  Fall Risk Category (Retired) Low Low Low    (RETIRED) Patient Fall Risk Level  Moderate fall risk Moderate fall risk Moderate fall risk    Patient at Risk for Falls Due to History of fall(s);Impaired balance/gait;Impaired mobility History of fall(s);Impaired balance/gait;Impaired mobility History of fall(s);Impaired balance/gait;Impaired mobility History of fall(s);Impaired balance/gait;Impaired mobility History of fall(s);Impaired balance/gait;Impaired mobility  Fall risk Follow up Falls evaluation completed;Education provided Falls evaluation completed Falls evaluation completed Falls evaluation completed;Falls prevention discussed;Education provided Falls evaluation completed;Education provided;Falls prevention discussed   Functional Status Survey:    Vitals:   09/22/23 0959  BP: 123/65  Pulse: (!) 54  Resp: 20  Temp: 97.9 F (36.6 C)  SpO2: 96%  Weight: 152 lb 6.4 oz (69.1 kg)  Height: 5\' 7"  (1.702 m)   Body mass index is 23.87 kg/m. Physical Exam Vitals reviewed.  Constitutional:       Appearance: He is ill-appearing.  HENT:     Head: Normocephalic.  Eyes:     General:        Right eye: No discharge.        Left eye: No discharge.  Cardiovascular:     Rate and Rhythm: Normal rate and regular rhythm.     Pulses: Normal pulses.     Heart sounds: Normal heart sounds.  Pulmonary:     Effort: Pulmonary effort is normal. No respiratory distress.     Breath sounds: Normal breath sounds. No stridor. No wheezing or rales.  Abdominal:     General: Bowel sounds are normal.     Palpations: Abdomen is soft.  Musculoskeletal:     Cervical back: Neck supple.     Right lower leg: No edema.     Left lower leg: No edema.  Skin:    General: Skin is warm.     Capillary Refill: Capillary refill takes less than 2 seconds.  Neurological:     General: No focal deficit present.     Mental Status: He is easily aroused. Mental status is at baseline.     Motor: Weakness present.     Gait: Gait abnormal.  Psychiatric:     Comments: nonverbal     Labs reviewed: Recent Labs    09/28/22 0000 04/26/23 0000  NA 27* 139  K 4.4 4.1  CL 107 106  CO2 27* 28*  BUN 26* 19  CREATININE 1.0 1.0  CALCIUM 8.4* 8.5*   Recent Labs    09/28/22 0000 04/26/23 0000  AST 17 17  ALT 15 11  ALKPHOS 39 51  ALBUMIN 3.2* 3.1*   Recent Labs    09/28/22 0000 04/26/23 0000  WBC 6.7 6.9  NEUTROABS 4,094.00  --   HGB 14.3 13.6  HCT 40* 40*  PLT 190 188   Lab Results  Component Value Date   TSH 2.28 09/28/2022   No results found for: "HGBA1C" Lab Results  Component Value Date   CHOL 97 04/26/2023   HDL 30 (A) 04/26/2023   LDLCALC 51 04/26/2023   TRIG 76 04/26/2023   CHOLHDL 2.9 05/09/2020    Significant Diagnostic Results in last 30 days:  No results found.  Assessment/Plan 1. End of life care (Primary) - decline within past 2 days> 6 episodes of dark brown emesis - suspect ischemic bowel or obstruction - not drinking or eating  - son agrees to comfort measures  -  discontinue po medications - start oral morphine and ativan for comfort - morphine (ROXANOL) 20 MG/ML concentrated solution; Take 0.25 mLs (5 mg total) by mouth every 4 (four) hours as needed for severe pain (pain  score 7-10), moderate pain (pain score 4-6) or shortness of breath.  Dispense: 15 mL; Refill: 0 - LORazepam (ATIVAN) 0.5 MG tablet; Take 1 tablet (0.5 mg total) by mouth every 4 (four) hours as needed for anxiety.  Dispense: 30 tablet; Refill: 0  2. Leukocytosis, unspecified type - WBC 19.3 - KUB negative for obstruction, moderate stool in colon noted - CXR negative for infiltrates - unable to obtain urine sample due to dehydration - discontinue UA/culture     Family/ staff Communication: plan discussed with patient, son and nurse  Labs/tests ordered:  none

## 2023-10-01 ENCOUNTER — Encounter: Payer: Self-pay | Admitting: Orthopedic Surgery

## 2023-10-01 ENCOUNTER — Non-Acute Institutional Stay: Payer: Self-pay | Admitting: Orthopedic Surgery

## 2023-10-01 DIAGNOSIS — R627 Adult failure to thrive: Secondary | ICD-10-CM | POA: Diagnosis not present

## 2023-10-01 NOTE — Progress Notes (Signed)
 Location:  [facility] Room Number: 29/A Place of Service:  ALF [phone]) Provider:  Octavia Heir, NP   Mahlon Gammon, MD  Patient Care Team: Mahlon Gammon, MD as PCP - General (Internal Medicine) Shelva Majestic, [facility] Campbell Stall, MD as Consulting Physician (Dermatology) Holli Humbles, MD as Referring Physician (Ophthalmology) Bond, Doran Stabler, MD as Referring Physician (Ophthalmology) Mahlon Gammon, MD as Referring Physician (Internal Medicine)  Extended Emergency Contact Information Primary Emergency Contact: [name] [place] of [place] Home Phone: [phone] Relation: Son  Code Status:  DNR Goals of care: Advanced Directive information    09/21/2023    9:48 AM  Advanced Directives  Does Patient Have a Medical Advance Directive? Yes  Type of Estate agent of [place];Living will;Out of facility DNR (pink MOST or yellow form)  Does patient want to make changes to medical advance directive? No - Patient declined  Copy of Healthcare Power of Attorney in Chart? Yes - validated most recent copy scanned in chart (See row information)  Pre-existing out of facility DNR order (yellow form or pink MOST form) Pink MOST/Yellow Form most recent copy in chart - Physician notified to receive inpatient order     Chief Complaint  Patient presents with   Acute Visit    Failure to thrive    HPI:  Pt is a 89 y.o. male seen today for acute visit due to failure to thrive.   He currently resides on the assisted living unit at [facility]. PMH: HTN, HLD, TIA, mixed hearing loss, actinic keratosis, glaucoma, joint pain, and depression.    "03/04 he had 6 episodes of dark brown emesis. KUB negative for occlusion, moderate stool in rectal vault. CXR negative for infiltrates or effusion. WBC was 19.3, other labs unremarkable. He was given Rocephin 2g IM once. This morning he is only responsive to touch. Nonverbal. He does not  follow commands. Nursing was unable to obtain urine sample for UA/culture. He is not drinking. Did not eat breakfast this morning. Treatment options discussed with son, [name]. He agrees to comfort care interventions."  03/05 po medications discontinued since he was not eating or drinking. He continues to have some days were he is not eating and drinking. He did not eat anything for 1.5 days, but ate 50% of breakfast this morning. N/V has subsided. He has had a few bowel movements per nursing. Sleeping more during day. He will get up with staff to use bathroom. 03/08 fall without injury. He is nonverbal during out encounter. Remains on morphine sulfate and ativan prn. Afebrile. Vitals stable.    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/18/2012   Deafness 10/18/2012   Endothelial corneal dystrophy    Essential hypertension 10/18/2012   Hammer toe 04/24/2014   Hyperlipidemia 10/18/2012   Intermediate stage nonexudative age-related macular degeneration of both eyes 04/16/2016   Nocturia 05/19/2016   Occlusion and stenosis of carotid artery with cerebral infarction    Other specified cardiac dysrhythmias(427.89)    Pain in joint, pelvic region and thigh    Paresthesia 04/30/2015   Toes of both feet    Peripheral vascular disease, unspecified (HCC)    Primary open  angle glaucoma 09/25/2011   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   Rosacea    Seborrheic keratosis 04/24/2014   Syncope 06/27/2013   TIA (transient ischemic attack) 10/18/2012   States he has had about 5  times   Urine frequency    Past Surgical History:  Procedure Laterality Date   CATARACT EXTRACTION  2005   bilateral   CORNEAL TRANSPLANT  2010 left; 2013 right eye    HERNIA REPAIR  1985   Left   HERNIA REPAIR  03/2004   Right   SHOULDER DEBRIDEMENT Left 2005   TONSILLECTOMY Bilateral childhood   TRANSURETHRAL RESECTION OF PROSTATE N/A 10/2003   VASECTOMY      No Known Allergies  Outpatient Encounter Medications as of 10/01/2023  Medication Sig   LORazepam (ATIVAN) 0.5 MG tablet Take 1 tablet (0.5 mg total) by mouth every 4 (four) hours as needed for anxiety.   morphine (ROXANOL) 20 MG/ML concentrated solution Take 0.25 mLs (5 mg total) by mouth every 4 (four) hours as needed for severe pain (pain score 7-10), moderate pain (pain score 4-6) or shortness of breath.   promethazine (PHENERGAN) 12.5 MG tablet Take 12.5 mg by mouth every 6 (six) hours. For nausea and vomiting.   promethazine (PHENERGAN) 25 MG/ML injection Inject 25 mg into the muscle every 6 (six) hours as needed for nausea or vomiting.   No facility-administered encounter medications on file as of 10/01/2023.    Review of Systems  Unable to perform ROS: Patient nonverbal    Immunization History  Administered Date(s) Administered   Influenza Whole 05/03/2002, 04/19/2012, 04/20/2013   Influenza, High Dose Seasonal PF 05/03/2019, 05/13/2022, 05/18/2023   Influenza,inj,Quad PF,6+ Mos 04/21/2018   Influenza-Unspecified 05/03/2014, 04/18/2015, 04/30/2016, 04/18/2017, 04/09/2020, 05/13/2021   Moderna Covid-19 Vaccine Bivalent Booster 39yrs & up 05/26/2023   Moderna SARS-COV2 Booster Vaccination 12/25/2020   Moderna Sars-Covid-2 Vaccination 07/24/2019, 08/21/2019, 06/03/2020, 12/31/2021, 05/26/2022   Pfizer Covid-19 Vaccine Bivalent Booster 28yrs & up 04/09/2021   Pneumococcal Conjugate-13 03/20/2010   Pneumococcal Polysaccharide-23 11/02/2017   Td 02/24/2018   Tdap 06/27/2011   Zoster Recombinant(Shingrix) 12/24/2017, 03/09/2018   Zoster, Live 11/30/2006   Pertinent  Health Maintenance Due  Topic Date Due   INFLUENZA VACCINE  Completed      08/25/2021   11:46 AM 07/01/2022   11:58  AM 07/29/2022    9:40 AM 09/21/2022    3:05 PM 06/25/2023    2:13 PM  Fall Risk  Falls in the past year? 1 1 0 1 0  Was there an injury with Fall? 0 0 0 0 0  Fall Risk Category Calculator 1 1 0 1 0  Fall Risk Category (Retired) Low Low Low    (RETIRED) Patient Fall Risk Level Moderate fall risk Moderate fall risk Moderate fall risk    Patient at Risk for Falls Due to History of fall(s);Impaired balance/gait;Impaired mobility History of fall(s);Impaired balance/gait;Impaired mobility History of fall(s);Impaired balance/gait;Impaired mobility History of fall(s);Impaired balance/gait;Impaired mobility History of fall(s);Impaired balance/gait;Impaired mobility  Fall risk Follow up Falls evaluation completed;Education provided Falls evaluation completed Falls evaluation completed Falls evaluation completed;Falls prevention discussed;Education provided Falls evaluation completed;Education provided;Falls prevention discussed   Functional Status Survey:    Vitals:   10/01/23 1318  BP: 130/76  Pulse: 72  Resp: (!) 24  Temp: 97.7 F (36.5 C)  SpO2: 96%  Weight: 152 lb 6.4 oz (69.1 kg)  Height: 5\' 7"  (1.702 m)   Body mass index is 23.87 kg/m. Physical Exam Vitals reviewed.  Constitutional:  Appearance: He is ill-appearing.  HENT:     Head: Normocephalic.  Eyes:     General:        Right eye: No discharge.        Left eye: No discharge.  Cardiovascular:     Rate and Rhythm: Normal rate and regular rhythm.     Pulses: Normal pulses.     Heart sounds: Normal heart sounds.  Pulmonary:     Effort: Pulmonary effort is normal. No respiratory distress.     Breath sounds: Examination of the right-middle field reveals decreased breath sounds. Examination of the left-middle field reveals decreased breath sounds. Decreased breath sounds present. No wheezing or rales.  Abdominal:     General: Bowel sounds are normal. There is no distension.     Palpations: Abdomen is soft.     Tenderness:  There is no abdominal tenderness.  Musculoskeletal:     Cervical back: Neck supple.     Right lower leg: No edema.     Left lower leg: No edema.  Skin:    General: Skin is warm.     Capillary Refill: Capillary refill takes less than 2 seconds.  Neurological:     General: No focal deficit present.     Mental Status: He is easily aroused.     Motor: Weakness present.     Gait: Gait abnormal.  Psychiatric:        Mood and Affect: Mood normal.     Labs reviewed: Recent Labs    04/26/23 0000  NA 139  K 4.1  CL 106  CO2 28*  BUN 19  CREATININE 1.0  CALCIUM 8.5*   Recent Labs    04/26/23 0000  AST 17  ALT 11  ALKPHOS 51  ALBUMIN 3.1*   Recent Labs    04/26/23 0000  WBC 6.9  HGB 13.6  HCT 40*  PLT 188   Lab Results  Component Value Date   TSH 2.28 09/28/2022   No results found for: "HGBA1C" Lab Results  Component Value Date   CHOL 97 04/26/2023   HDL 30 (A) 04/26/2023   LDLCALC 51 04/26/2023   TRIG 76 04/26/2023   CHOLHDL 2.9 05/09/2020    Significant Diagnostic Results in last 30 days:  No results found.  Assessment/Plan 1. Adult failure to thrive (Primary) - began 03/04> N/V x 6 episodes> dark/stool like> WBC 19.3> other labs/CXR/KUB unremarkable - 03/05 po medications discontinued  - po intake poor> going 1-2 days without eating  - recommend consulting Hospice - cont morphine sulfate and ativan prn    Family/ staff Communication: plan discussed with patient and nurse  Labs/tests ordered:  Hospice consult

## 2023-11-22 ENCOUNTER — Non-Acute Institutional Stay: Payer: Self-pay | Admitting: Orthopedic Surgery

## 2023-11-22 ENCOUNTER — Encounter: Payer: Self-pay | Admitting: Orthopedic Surgery

## 2023-11-22 DIAGNOSIS — B372 Candidiasis of skin and nail: Secondary | ICD-10-CM

## 2023-11-22 MED ORDER — NYSTATIN 100000 UNIT/GM EX CREA: 1.0000 | TOPICAL_CREAM | Freq: Three times a day (TID) | CUTANEOUS | Status: AC

## 2023-11-22 NOTE — Progress Notes (Signed)
 Location:  [facility] Room Number: 29/A Place of Service:  ALF [phone]) Provider:  Arnetha Bhat, NP   Marguerite Shiley, MD  Patient Care Team: Marguerite Shiley, MD as PCP - General (Internal Medicine) Stephenie Einstein, [facility] Wolm Hawthorne, MD as Consulting Physician (Dermatology) Ladon Pickler, MD as Referring Physician (Ophthalmology) Bond, Muriel Arm, MD as Referring Physician (Ophthalmology) Marguerite Shiley, MD as Referring Physician (Internal Medicine)  Extended Emergency Contact Information Primary Emergency Contact: Bechler,Steve  [place] Home Phone: [phone] Relation: Son  Code Status:  DNR Goals of care: Advanced Directive information    09/21/2023    9:48 AM  Advanced Directives  Does Patient Have a Medical Advance Directive? Yes  Type of Estate agent of [place];Living will;Out of facility DNR (pink MOST or yellow form)  Does patient want to make changes to medical advance directive? No - Patient declined  Copy of Healthcare Power of Attorney in Chart? Yes - validated most recent copy scanned in chart (See row information)  Pre-existing out of facility DNR order (yellow form or pink MOST form) Pink MOST/Yellow Form most recent copy in chart - Physician notified to receive inpatient order     Chief Complaint  Patient presents with   Acute Visit    Rash to groin    HPI:  Pt is a 89 y.o. [name] seen today for acute visit due to rash.   He currently resides on the assisted living unit at [facility]. PMH: HTN, HLD, TIA, mixed hearing loss, actinic keratosis, glaucoma, joint pain, and depression.   Followed by hospice due to FOT.   Nursing reports red rash to groin folds. They tried applying zinc oxide cream without success. Nonverbal during encounter today. Incontinent of B&B. Afebrile. Vitals stable.    Past Medical History:  Diagnosis Date   Actinic keratosis 10/18/2012   Anisocoria 10/30/2014    Left pupil is larger than the right postsurgery corneal transplant.    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/18/2012   Deafness 10/18/2012   Endothelial corneal dystrophy    Essential hypertension 10/18/2012   Hammer toe 04/24/2014   Hyperlipidemia 10/18/2012   Intermediate stage nonexudative age-related macular degeneration of both eyes 04/16/2016   Nocturia 05/19/2016   Occlusion and stenosis of carotid artery with cerebral infarction    Other specified cardiac dysrhythmias(427.89)    Pain in joint, pelvic region and thigh    Paresthesia 04/30/2015   Toes of both feet    Peripheral vascular disease, unspecified (HCC)    Primary open angle glaucoma 09/25/2011   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   Rosacea    Seborrheic keratosis 04/24/2014   Syncope 06/27/2013   TIA (transient ischemic attack) 10/18/2012   States he has had about 5  times   Urine frequency    Past Surgical History:  Procedure Laterality Date   CATARACT EXTRACTION  2005   bilateral   CORNEAL TRANSPLANT  2010 left; 2013 right eye   HERNIA REPAIR  1985   Left   HERNIA REPAIR  03/2004   Right   SHOULDER DEBRIDEMENT Left 2005   TONSILLECTOMY Bilateral childhood   TRANSURETHRAL RESECTION OF PROSTATE N/A 10/2003   VASECTOMY      No Known Allergies  Outpatient Encounter Medications as of 11/22/2023  Medication Sig   LORazepam  (ATIVAN ) 0.5 MG tablet Take 1  tablet (0.5 mg total) by mouth every 4 (four) hours as needed for anxiety.   morphine  (ROXANOL) 20 MG/ML concentrated solution Take 0.25 mLs (5 mg total) by mouth every 4 (four) hours as needed for severe pain (pain score 7-10), moderate pain (pain score 4-6) or shortness of breath.   No facility-administered encounter medications on file as of 11/22/2023.    Review of Systems  Unable to perform ROS: Dementia    Immunization History  Administered Date(s)  Administered   Influenza Whole 05/03/2002, 04/19/2012, 04/20/2013   Influenza, High Dose Seasonal PF 05/03/2019, 05/13/2022, 05/18/2023   Influenza,inj,Quad PF,6+ Mos 04/21/2018   Influenza-Unspecified 05/03/2014, 04/18/2015, 04/30/2016, 04/18/2017, 04/09/2020, 05/13/2021   Moderna Covid-19 Vaccine Bivalent Booster 49yrs & up 05/26/2023   Moderna SARS-COV2 Booster Vaccination 12/25/2020   Moderna Sars-Covid-2 Vaccination 07/24/2019, 08/21/2019, 06/03/2020, 12/31/2021, 05/26/2022   Pfizer Covid-19 Vaccine Bivalent Booster 16yrs & up 04/09/2021   Pneumococcal Conjugate-13 03/20/2010   Pneumococcal Polysaccharide-23 11/02/2017   Td 02/24/2018   Tdap 06/27/2011   Zoster Recombinant(Shingrix) 12/24/2017, 03/09/2018   Zoster, Live 11/30/2006   Pertinent  Health Maintenance Due  Topic Date Due   INFLUENZA VACCINE  02/18/2024      07/01/2022   11:58 AM 07/29/2022    9:40 AM 09/21/2022    3:05 PM 06/25/2023    2:13 PM 10/03/2023    6:11 PM  Fall Risk  Falls in the past year? 1 0 1 0 1  Was there an injury with Fall? 0 0 0 0 0  Fall Risk Category Calculator 1 0 1 0 1  Fall Risk Category (Retired) Low Low     (RETIRED) Patient Fall Risk Level Moderate fall risk Moderate fall risk     Patient at Risk for Falls Due to History of fall(s);Impaired balance/gait;Impaired mobility History of fall(s);Impaired balance/gait;Impaired mobility History of fall(s);Impaired balance/gait;Impaired mobility History of fall(s);Impaired balance/gait;Impaired mobility History of fall(s);Impaired balance/gait  Fall risk Follow up Falls evaluation completed Falls evaluation completed Falls evaluation completed;Falls prevention discussed;Education provided Falls evaluation completed;Education provided;Falls prevention discussed Falls evaluation completed;Education provided   Functional Status Survey:    Vitals:   11/22/23 1544  BP: (!) 149/81  Pulse: 71  Resp: 18  Temp: 98.6 F (37 C)  SpO2: 96%  Weight: 146  lb 3.2 oz (66.3 kg)  Height: 5\' 7"  (1.702 m)   Body mass index is 22.9 kg/m. Physical Exam Vitals reviewed.  Constitutional:      General: He is not in acute distress. Eyes:     General:        Right eye: No discharge.        Left eye: No discharge.  Cardiovascular:     Rate and Rhythm: Normal rate and regular rhythm.     Pulses: Normal pulses.     Heart sounds: Normal heart sounds.  Pulmonary:     Effort: Pulmonary effort is normal.     Breath sounds: Normal breath sounds.  Abdominal:     Palpations: Abdomen is soft.  Musculoskeletal:     Cervical back: Neck supple.     Right lower leg: No edema.     Left lower leg: No edema.  Skin:    Capillary Refill: Capillary refill takes less than 2 seconds.     Findings: Rash present.     Comments: Excoriated skin to groin folds  Neurological:     General: No focal deficit present.     Mental Status: He is alert. Mental status is at baseline.  Motor: Weakness present.     Gait: Gait abnormal.  Psychiatric:        Mood and Affect: Mood normal.     Comments: Nonverbal, does not follow commands     Labs reviewed: Recent Labs    04/26/23 0000  NA 139  K 4.1  CL 106  CO2 28*  BUN 19  CREATININE 1.0  CALCIUM  8.5*   Recent Labs    04/26/23 0000  AST 17  ALT 11  ALKPHOS 51  ALBUMIN 3.1*   Recent Labs    04/26/23 0000  WBC 6.9  HGB 13.6  HCT 40*  PLT 188   Lab Results  Component Value Date   TSH 2.28 09/28/2022   No results found for: "HGBA1C" Lab Results  Component Value Date   CHOL 97 04/26/2023   HDL 30 (A) 04/26/2023   LDLCALC 51 04/26/2023   TRIG 76 04/26/2023   CHOLHDL 2.9 05/09/2020    Significant Diagnostic Results in last 30 days:  No results found.  Assessment/Plan 1. Candidal skin infection (Primary) - nystatin cream (MYCOSTATIN); Apply 1 Application topically 3 (three) times daily for 10 days.    Family/ staff Communication: plan discussed with patient and nurse  Labs/tests  ordered:  none

## 2023-11-24 ENCOUNTER — Other Ambulatory Visit: Payer: Self-pay | Admitting: Orthopedic Surgery

## 2023-11-24 DIAGNOSIS — Z515 Encounter for palliative care: Secondary | ICD-10-CM

## 2023-11-24 MED ORDER — LORAZEPAM 0.5 MG PO TABS
0.5000 mg | ORAL_TABLET | ORAL | 0 refills | Status: DC | PRN
Start: 2023-11-24 — End: 2023-12-20

## 2023-11-24 MED ORDER — MORPHINE SULFATE (CONCENTRATE) 20 MG/ML PO SOLN: 5.0000 mg | ORAL | 0 refills | Status: AC | PRN

## 2023-12-02 ENCOUNTER — Non-Acute Institutional Stay: Payer: Self-pay | Admitting: Internal Medicine

## 2023-12-02 DIAGNOSIS — I69919 Unspecified symptoms and signs involving cognitive functions following unspecified cerebrovascular disease: Secondary | ICD-10-CM | POA: Diagnosis not present

## 2023-12-02 DIAGNOSIS — I10 Essential (primary) hypertension: Secondary | ICD-10-CM

## 2023-12-02 DIAGNOSIS — G459 Transient cerebral ischemic attack, unspecified: Secondary | ICD-10-CM | POA: Diagnosis not present

## 2023-12-02 DIAGNOSIS — F339 Major depressive disorder, recurrent, unspecified: Secondary | ICD-10-CM

## 2023-12-05 ENCOUNTER — Encounter: Payer: Self-pay | Admitting: Internal Medicine

## 2023-12-05 NOTE — Progress Notes (Signed)
 [facility]:  ALF [phone])  Provider:   Code Status: DNR/hospice Goals of Care:     09/21/2023    9:48 AM  Advanced Directives  Does Patient Have a Medical Advance Directive? Yes  Type of Estate agent of [place];Living will;Out of facility DNR (pink MOST or yellow form)  Does patient want to make changes to medical advance directive? No - Patient declined  Copy of Healthcare Power of Attorney in Chart? Yes - validated most recent copy scanned in chart (See row information)  Pre-existing out of facility DNR order (yellow form or pink MOST form) Pink MOST/Yellow Form most recent copy in chart - Physician notified to receive inpatient order     Chief Complaint  Patient presents with   Care Management    HPI: Patient is a 89 y.o. male seen today for medical management of chronic diseases.    Seen For Regular Visit Lives in [place] Walks with his walker   Patient has h/o Hypertension and Depression, TIA, Skin cancer Also has h/o TIA versus migraine Had detailed work-up by neurology for his atypical symptoms of visual impairment. With Aphasia and Word finding.He has had detail work ups including EEG, Echo and MRI .which have been Negative. They have recommended Asprin, BP control and Statin  He is now enrolled in Hospice Progressive Cognition Decline Needs help and Cueing with his ADLS Still able to walk with his walker Aphasia is worse NO New Nursing Issues Has Lost 10 lbs in past 6 months Wt Readings from Last 3 Encounters:  12/05/23 146 lb 3.2 oz (66.3 kg)  11/22/23 146 lb 3.2 oz (66.3 kg)  10/01/23 152 lb 6.4 oz (69.1 kg)    Past Medical History:  Diagnosis Date   Actinic keratosis 10/18/2012   Anisocoria 10/30/2014   Left pupil is larger than the right postsurgery corneal transplant.    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  04/25/2012   Cyclitis, chronic 11/18/2012   Deafness 10/18/2012   Endothelial corneal dystrophy    Essential hypertension 10/18/2012   Hammer toe 04/24/2014   Hyperlipidemia 10/18/2012   Intermediate stage nonexudative age-related macular degeneration of both eyes 04/16/2016   Nocturia 05/19/2016   Occlusion and stenosis of carotid artery with cerebral infarction    Other specified cardiac dysrhythmias(427.89)    Pain in joint, pelvic region and thigh    Paresthesia 04/30/2015   Toes of both feet    Peripheral vascular disease, unspecified (HCC)    Primary open angle glaucoma 09/25/2011   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   Rosacea    Seborrheic keratosis 04/24/2014   Syncope 06/27/2013   TIA (transient ischemic attack) 10/18/2012   States he has had about 5  times   Urine frequency     Past Surgical History:  Procedure Laterality Date   CATARACT EXTRACTION  2005   bilateral   CORNEAL TRANSPLANT  2010 left; 2013 right eye   HERNIA REPAIR  1985   Left   HERNIA REPAIR  03/2004   Right   SHOULDER DEBRIDEMENT Left 2005   TONSILLECTOMY Bilateral childhood   TRANSURETHRAL RESECTION OF PROSTATE N/A 10/2003   VASECTOMY      No Known Allergies  Outpatient Encounter Medications as of 12/02/2023  Medication Sig   LORazepam  (ATIVAN ) 0.5 MG tablet Take 1 tablet (0.5 mg total) by mouth every 4 (four) hours as  needed for anxiety.   morphine  (ROXANOL) 20 MG/ML concentrated solution Take 0.25 mLs (5 mg total) by mouth every 4 (four) hours as needed for severe pain (pain score 7-10), moderate pain (pain score 4-6) or shortness of breath.   [EXPIRED] nystatin  cream (MYCOSTATIN ) Apply 1 Application topically 3 (three) times daily for 10 days.   No facility-administered encounter medications on file as of 12/02/2023.    Review of Systems:  Review of Systems  Unable to perform ROS: Dementia    Health Maintenance  Topic Date Due   COVID-19 Vaccine (8 - 2024-25 season) 07/21/2023   INFLUENZA VACCINE  02/18/2024    DTaP/Tdap/Td (3 - Td or Tdap) 02/25/2028   Pneumonia Vaccine 52+ Years old  Completed   Zoster Vaccines- Shingrix  Completed   HPV VACCINES  Aged Out   Meningococcal B Vaccine  Aged Out    Physical Exam: There were no vitals filed for this visit. There is no height or weight on file to calculate BMI. Physical Exam Vitals reviewed.  Constitutional:      Appearance: Normal appearance.  HENT:     Head: Normocephalic.     Nose: Nose normal.     Mouth/Throat:     Mouth: Mucous membranes are moist.     Pharynx: Oropharynx is clear.  Eyes:     Pupils: Pupils are equal, round, and reactive to light.  Cardiovascular:     Rate and Rhythm: Normal rate and regular rhythm.     Pulses: Normal pulses.     Heart sounds: No murmur heard. Pulmonary:     Effort: Pulmonary effort is normal. No respiratory distress.     Breath sounds: Normal breath sounds. No rales.  Abdominal:     General: Abdomen is flat. Bowel sounds are normal.     Palpations: Abdomen is soft.  Musculoskeletal:        General: No swelling.     Cervical back: Neck supple.  Skin:    General: Skin is warm.  Neurological:     Mental Status: He is alert.     Comments: Has Aphasia now Walks with his walker   Psychiatric:        Mood and Affect: Mood normal.        Thought Content: Thought content normal.     Labs reviewed: Basic Metabolic Panel: Recent Labs    04/26/23 0000  NA 139  K 4.1  CL 106  CO2 28*  BUN 19  CREATININE 1.0  CALCIUM  8.5*   Liver Function Tests: Recent Labs    04/26/23 0000  AST 17  ALT 11  ALKPHOS 51  ALBUMIN 3.1*   No results for input(s): "LIPASE", "AMYLASE" in the last 8760 hours. No results for input(s): "AMMONIA" in the last 8760 hours. CBC: Recent Labs    04/26/23 0000  WBC 6.9  HGB 13.6  HCT 40*  PLT 188   Lipid Panel: Recent Labs    04/26/23 0000  CHOL 97  HDL 30*  LDLCALC 51  TRIG 76   No results found for: "HGBA1C"  Procedures since last visit: No  results found.  Assessment/Plan Patient with history of Hypertension TIA HLD and Depression with Vascular Dementia He is now Comfort care on Hospice Off All meds   Labs/tests ordered:  * No order type specified * Next appt:  Visit date not found

## 2023-12-20 ENCOUNTER — Other Ambulatory Visit: Payer: Self-pay | Admitting: Orthopedic Surgery

## 2023-12-20 DIAGNOSIS — Z515 Encounter for palliative care: Secondary | ICD-10-CM

## 2023-12-20 MED ORDER — LORAZEPAM 0.5 MG PO TABS: 0.5000 mg | ORAL_TABLET | ORAL | 0 refills | Status: DC | PRN

## 2024-01-13 ENCOUNTER — Non-Acute Institutional Stay: Payer: Self-pay | Admitting: Internal Medicine

## 2024-01-13 DIAGNOSIS — H00011 Hordeolum externum right upper eyelid: Secondary | ICD-10-CM | POA: Diagnosis not present

## 2024-01-13 NOTE — Addendum Note (Signed)
 Addended by: CHARLANNE FREDIA SMALLING on: 01/13/2024 03:17 PM   Modules accepted: Level of Service

## 2024-01-13 NOTE — Progress Notes (Signed)
 [facility] Service:  ALF [phone])  Provider:   Code Status: DNR/hospice Goals of Care:     09/21/2023    9:48 AM  Advanced Directives  Does Patient Have a Medical Advance Directive? Yes  Type of Estate agent of [place];Living will;Out of facility DNR (pink MOST or yellow form)  Does patient want to make changes to medical advance directive? No - Patient declined  Copy of Healthcare Power of Attorney in Chart? Yes - validated most recent copy scanned in chart (See row information)  Pre-existing out of facility DNR order (yellow form or pink MOST form) Pink MOST/Yellow Form most recent copy in chart - Physician notified to receive inpatient order     Chief Complaint  Patient presents with   Acute Visit    HPI: Patient is a 89 y.o. male seen today for an acute visit for Right Eye swelling and redness   Past Medical History:  Diagnosis Date   Actinic keratosis 10/18/2012   Anisocoria 10/30/2014   Left pupil is larger than the right postsurgery corneal transplant.    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/18/2012   Deafness 10/18/2012   Endothelial corneal dystrophy    Essential hypertension 10/18/2012   Hammer toe 04/24/2014   Hyperlipidemia 10/18/2012   Intermediate stage nonexudative age-related macular degeneration of both eyes 04/16/2016   Nocturia 05/19/2016   Occlusion and stenosis of carotid artery with cerebral infarction    Other specified cardiac dysrhythmias(427.89)    Pain in joint, pelvic region and thigh    Paresthesia 04/30/2015   Toes of both feet    Peripheral vascular disease, unspecified (HCC)    Primary open angle glaucoma 09/25/2011   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   Rosacea    Seborrheic keratosis 04/24/2014   Syncope 06/27/2013   TIA (transient ischemic attack) 10/18/2012   States he has had about 5   times   Urine frequency     Past Surgical History:  Procedure Laterality Date   CATARACT EXTRACTION  2005   bilateral   CORNEAL TRANSPLANT  2010 left; 2013 right eye   HERNIA REPAIR  1985   Left   HERNIA REPAIR  03/2004   Right   SHOULDER DEBRIDEMENT Left 2005   TONSILLECTOMY Bilateral childhood   TRANSURETHRAL RESECTION OF PROSTATE N/A 10/2003   VASECTOMY      No Known Allergies  Outpatient Encounter Medications as of 01/13/2024  Medication Sig   LORazepam  (ATIVAN ) 0.5 MG tablet Take 1 tablet (0.5 mg total) by mouth every 4 (four) hours as needed for anxiety.   morphine  (ROXANOL) 20 MG/ML concentrated solution Take 0.25 mLs (5 mg total) by mouth every 4 (four) hours as needed for severe pain (pain score 7-10), moderate pain (pain score 4-6) or shortness of breath.   No facility-administered encounter medications on file as of 01/13/2024.    Review of Systems:  Review of Systems  Unable to perform ROS: Dementia    Health Maintenance  Topic Date Due   COVID-19 Vaccine (8 - 2024-25 season) 07/21/2023   INFLUENZA VACCINE  02/18/2024   DTaP/Tdap/Td (3 - Td or Tdap) 02/25/2028   Pneumococcal Vaccine: 50+ Years  Completed   Zoster Vaccines- Shingrix  Completed   Hepatitis B Vaccines  Aged Out   HPV VACCINES  Aged Out   Meningococcal B Vaccine  Aged  Out    Physical Exam: There were no vitals filed for this visit. There is no height or weight on file to calculate BMI. Physical Exam Constitutional:      Appearance: Normal appearance.  HENT:     Nose: Nose normal.     Mouth/Throat:     Mouth: Mucous membranes are moist.     Pharynx: Oropharynx is clear.   Eyes:     Pupils: Pupils are equal, round, and reactive to light.     Comments: Right Eye Upper lid in swollen and red and he has stye   Neurological:     Mental Status: He is alert.     Labs reviewed: Basic Metabolic Panel: Recent Labs    04/26/23 0000  NA 139  K 4.1  CL 106  CO2 28*  BUN 19   CREATININE 1.0  CALCIUM  8.5*   Liver Function Tests: Recent Labs    04/26/23 0000  AST 17  ALT 11  ALKPHOS 51  ALBUMIN 3.1*   No results for input(s): LIPASE, AMYLASE in the last 8760 hours. No results for input(s): AMMONIA in the last 8760 hours. CBC: Recent Labs    04/26/23 0000  WBC 6.9  HGB 13.6  HCT 40*  PLT 188   Lipid Panel: Recent Labs    04/26/23 0000  CHOL 97  HDL 30*  LDLCALC 51  TRIG 76   No results found for: HGBA1C  Procedures since last visit: No results found.  Assessment/Plan 1. Hordeolum externum of right upper eyelid (Primary) Erythromycin ointment QID for 2 weeks Warm Compresses TID     Labs/tests ordered:  * No order type specified * Next appt:  Visit date not found

## 2024-01-27 ENCOUNTER — Encounter: Payer: Self-pay | Admitting: Internal Medicine

## 2024-01-27 ENCOUNTER — Non-Acute Institutional Stay: Admitting: Internal Medicine

## 2024-01-27 DIAGNOSIS — F339 Major depressive disorder, recurrent, unspecified: Secondary | ICD-10-CM

## 2024-01-27 DIAGNOSIS — G459 Transient cerebral ischemic attack, unspecified: Secondary | ICD-10-CM | POA: Diagnosis not present

## 2024-01-27 DIAGNOSIS — I10 Essential (primary) hypertension: Secondary | ICD-10-CM

## 2024-01-27 DIAGNOSIS — F01C0 Vascular dementia, severe, without behavioral disturbance, psychotic disturbance, mood disturbance, and anxiety: Secondary | ICD-10-CM | POA: Diagnosis not present

## 2024-01-27 NOTE — Progress Notes (Signed)
 Provider:    Charlanne Fredia CROME, MD  Location:  [facility] Room Number: N20-A Place of Service:  ALF ([phone])  PCP: Charlanne Fredia CROME, MD Patient Care Team: Charlanne Fredia CROME, MD as PCP - General (Internal Medicine) Devora, [facility] Helga Slice, MD as Consulting Physician (Dermatology) Caresse Cough, MD as Referring Physician (Ophthalmology) [place], Reyes Mcardle, MD as Referring Physician (Ophthalmology) Charlanne Fredia CROME, MD as Referring Physician (Internal Medicine)  Extended Emergency Contact Information Primary Emergency Contact: Hepworth,Steve  [place] Home Phone: [phone] Relation: Son  Code Status: DNR Goals of Care: Advanced Directive information    01/27/2024    8:48 AM  Advanced Directives  Does Patient Have a Medical Advance Directive? Yes  Type of Estate agent of [place];Out of facility DNR (pink MOST or yellow form)  Does patient want to make changes to medical advance directive? No - Patient declined  Copy of Healthcare Power of Attorney in Chart? Yes - validated most recent copy scanned in chart (See row information)      Chief Complaint  Patient presents with   New Admit To SNF    HPI: Patient is a 89 y.o. male seen today for admission to SNF  Was in AL  Now transfer to SNF due to needing more help with ADLS  Patient has h/o Hypertension and Depression, TIA, Skin cancer Also has h/o TIA versus migraine Had detailed work-up by neurology for his atypical symptoms of visual impairment. With Aphasia and Word finding.He has had detail work ups including EEG, Echo and MRI .which have been Negative.in 2019 They have recommended Asprin, BP control and Statin   Patient continued to get Worse It was decided not to do more work up due to his age But he had worsen with his aphasia and Cognition Now Enrolled in Hospice Needs Help with his ADLS Needs Cueing with his Feeding No Nursing issues Stays in  Wheelchair  Wt Readings from Last 3 Encounters:  01/27/24 148 lb (67.1 kg)  12/05/23 146 lb 3.2 oz (66.3 kg)  11/22/23 146 lb 3.2 oz (66.3 kg)   Not walking with walker anymore  Unable to give history  Past Medical History:  Diagnosis Date   Actinic keratosis 10/18/2012   Anisocoria 10/30/2014   Left pupil is larger than the right postsurgery corneal transplant.    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/18/2012   Deafness 10/18/2012   Endothelial corneal dystrophy    Essential hypertension 10/18/2012   Hammer toe 04/24/2014   Hyperlipidemia 10/18/2012   Intermediate stage nonexudative age-related macular degeneration of both eyes 04/16/2016   Nocturia 05/19/2016   Occlusion and stenosis of carotid artery with cerebral infarction    Other specified cardiac dysrhythmias(427.89)    Pain in joint, pelvic region and thigh    Paresthesia 04/30/2015   Toes of both feet    Peripheral vascular disease, unspecified (HCC)    Primary open angle glaucoma 09/25/2011   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   Rosacea    Seborrheic keratosis 04/24/2014   Syncope 06/27/2013   TIA (transient ischemic attack) 10/18/2012   States he has had about 5  times   Urine frequency    Past Surgical History:  Procedure Laterality Date   CATARACT EXTRACTION  2005   bilateral   CORNEAL TRANSPLANT  2010 left; 2013 right eye   HERNIA REPAIR  1985   Left   HERNIA REPAIR  03/2004   Right   SHOULDER DEBRIDEMENT Left 2005   TONSILLECTOMY Bilateral childhood   TRANSURETHRAL RESECTION OF PROSTATE N/A 10/2003   VASECTOMY      reports that he quit smoking about 59 years ago. His smoking use included cigarettes. He started smoking about 84 years ago. He has a 25 pack-year smoking history. He has never used smokeless tobacco. He reports that he does not drink alcohol and does not use drugs. Social History   Socioeconomic  History   Marital status: Widowed    Spouse name: Not on file   Number of children: Not on file   Years of education: Not on file   Highest education level: Not on file  Occupational History   Occupation: retired Stage manager  Tobacco Use   Smoking status: Former    Current packs/day: 0.00    Average packs/day: 1 pack/day for 25.0 years (25.0 ttl pk-yrs)    Types: Cigarettes    Start date: 10/19/1939    Quit date: 10/18/1964    Years since quitting: 59.3   Smokeless tobacco: Never   Tobacco comments:    quit 1966  Vaping Use   Vaping status: Never Used  Substance and Sexual Activity   Alcohol use: No    Alcohol/week: 1.0 standard drink of alcohol    Types: 1 Glasses of wine per week    Comment: very seldom   Drug use: No   Sexual activity: Not on file  Other Topics Concern   Not on file  Social History Narrative   Lives at [facility] since 08/21/2011   Married to [place]   Has Living Will   Exercise: 3 x week bicycle   Previously smoked stopped 1966   Does not drink cafferinated beverage   Drinks one small glass of wine few nights a week    Social Drivers of Corporate investment banker Strain: Low Risk  (09/21/2022)   Overall Financial Resource Strain (CARDIA)    Difficulty of Paying Living Expenses: Not hard at all  Food Insecurity: No Food Insecurity (09/21/2022)   Hunger Vital Sign    Worried About Running Out of Food in the Last Year: Never true    Ran Out of Food in the Last Year: Never true  Transportation Needs: No Transportation Needs (09/21/2022)   PRAPARE - Administrator, Civil Service (Medical): No    Lack of Transportation (Non-Medical): No  Physical Activity: Insufficiently Active (09/21/2022)   Exercise Vital Sign    Days of Exercise per Week: 5 days    Minutes of Exercise per Session: 10 min  Stress: No Stress Concern Present (09/21/2022)   [employer] of Occupational Health - Occupational Stress Questionnaire    Feeling of Stress  : Not at all  Social Connections: Moderately Isolated (09/21/2022)   Social Connection and Isolation Panel    Frequency of Communication with Friends and Family: More than three times a week    Frequency of Social Gatherings with Friends and Family: More than three times a week    Attends Religious Services: 1 to 4 times per year    Active Member of [employer] or Organizations: No    Attends Banker Meetings: Never    Marital Status: Widowed  Intimate Partner Violence: Unknown (09/21/2022)   Humiliation, Afraid, Rape, and Kick questionnaire    Fear of Current or Ex-Partner: No    Emotionally Abused:  No    Physically Abused: Not on file    Sexually Abused: No    Functional Status Survey:    Family History  Problem Relation Age of Onset   Cancer Mother    Heart disease Father     Health Maintenance  Topic Date Due   COVID-19 Vaccine (8 - 2024-25 season) 07/21/2023   INFLUENZA VACCINE  02/18/2024   DTaP/Tdap/Td (3 - Td or Tdap) 02/25/2028   Pneumococcal Vaccine: 50+ Years  Completed   Zoster Vaccines- Shingrix  Completed   Hepatitis B Vaccines  Aged Out   HPV VACCINES  Aged Out   Meningococcal B Vaccine  Aged Out    No Known Allergies  Outpatient Encounter Medications as of 01/27/2024  Medication Sig   ammonium lactate (LAC-HYDRIN) 12 % lotion Apply 1 Application topically as needed for dry skin (Apply to arms, legs and back for rough, dry skin. Avoid face and skin folds.).   dorzolamide -timolol  (COSOPT) 2-0.5 % ophthalmic solution Place 1 drop into both eyes 2 (two) times daily.   erythromycin ophthalmic ointment Place 1 Application into the right eye 4 (four) times daily.   feeding supplement (BOOST HIGH PROTEIN) LIQD Take 1 Container by mouth daily.   latanoprost  (XALATAN ) 0.005 % ophthalmic solution Place 1 drop into the left eye at bedtime.   LORazepam  (ATIVAN ) 0.5 MG tablet Take 1 tablet (0.5 mg total) by mouth every 4 (four) hours as needed for anxiety.    morphine  (ROXANOL) 20 MG/ML concentrated solution Take 0.25 mLs (5 mg total) by mouth every 4 (four) hours as needed for severe pain (pain score 7-10), moderate pain (pain score 4-6) or shortness of breath.   zinc oxide 20 % ointment Apply 1 Application topically as needed for irritation.   No facility-administered encounter medications on file as of 01/27/2024.    Review of Systems  Unable to perform ROS: Dementia    Vitals:   01/27/24 0834  BP: 135/65  Pulse: 75  Temp: 97.6 F (36.4 C)  SpO2: 97%  Weight: 148 lb (67.1 kg)  Height: 5' 7 (1.702 m)   Body mass index is 23.18 kg/m. Physical Exam Vitals reviewed.  Constitutional:      Comments: Has Frozen stare. Does not respond or follow Commands  HENT:     Head: Normocephalic.     Nose: Nose normal.     Mouth/Throat:     Mouth: Mucous membranes are moist.     Pharynx: Oropharynx is clear.  Eyes:     Pupils: Pupils are equal, round, and reactive to light.  Cardiovascular:     Rate and Rhythm: Normal rate. Rhythm irregular.     Pulses: Normal pulses.     Heart sounds: No murmur heard. Pulmonary:     Effort: Pulmonary effort is normal. No respiratory distress.     Breath sounds: Normal breath sounds. No rales.  Abdominal:     General: Abdomen is flat. Bowel sounds are normal.     Palpations: Abdomen is soft.  Musculoskeletal:        General: No swelling.     Cervical back: Neck supple.  Skin:    General: Skin is warm.  Neurological:     General: No focal deficit present.     Mental Status: He is alert and oriented to person, place, and time.  Psychiatric:        Mood and Affect: Mood normal.        Thought Content: Thought content normal.  Labs reviewed: Basic Metabolic Panel: Recent Labs    04/26/23 0000 09/21/23 0000  NA 139 140  K 4.1 3.9  CL 106 106  CO2 28* 23*  BUN 19 24*  CREATININE 1.0 1.1  CALCIUM  8.5* 8.7   Liver Function Tests: Recent Labs    04/26/23 0000 09/21/23 0000  AST 17 20   ALT 11 12  ALKPHOS 51 54  ALBUMIN 3.1* 3.6   No results for input(s): LIPASE, AMYLASE in the last 8760 hours. No results for input(s): AMMONIA in the last 8760 hours. CBC: Recent Labs    04/26/23 0000 09/21/23 0000  WBC 6.9 19.3  HGB 13.6 14.1  HCT 40* 43  PLT 188 186   Cardiac Enzymes: No results for input(s): CKTOTAL, CKMB, CKMBINDEX, TROPONINI in the last 8760 hours. BNP: Invalid input(s): POCBNP No results found for: HGBA1C Lab Results  Component Value Date   TSH 2.28 09/28/2022   No results found for: VITAMINB12 No results found for: FOLATE No results found for: IRON, TIBC, FERRITIN  Imaging and Procedures obtained prior to SNF admission: No results found.  Assessment/Plan Patient with h/o TIA Hypertension,HLD And vascular dementia Moved to SNF as needing hihger level of  care He is on hospice  Continue Ativan  and Morphine  PRN Not on any meds   Family/ staff Communication:   Labs/tests ordered:

## 2024-02-28 ENCOUNTER — Encounter: Payer: Self-pay | Admitting: Orthopedic Surgery

## 2024-02-28 ENCOUNTER — Non-Acute Institutional Stay: Payer: Self-pay | Admitting: Orthopedic Surgery

## 2024-02-28 DIAGNOSIS — F339 Major depressive disorder, recurrent, unspecified: Secondary | ICD-10-CM | POA: Diagnosis not present

## 2024-02-28 DIAGNOSIS — G459 Transient cerebral ischemic attack, unspecified: Secondary | ICD-10-CM

## 2024-02-28 DIAGNOSIS — I10 Essential (primary) hypertension: Secondary | ICD-10-CM | POA: Diagnosis not present

## 2024-02-28 DIAGNOSIS — F01C0 Vascular dementia, severe, without behavioral disturbance, psychotic disturbance, mood disturbance, and anxiety: Secondary | ICD-10-CM | POA: Diagnosis not present

## 2024-02-28 NOTE — Progress Notes (Signed)
 Location:  [facility] Room Number: 20/A Place of Service:  ALF [phone]) Provider:  Greig FORBES Cluster, NP   Charlanne Fredia CROME, MD  Patient Care Team: Charlanne Fredia CROME, MD as PCP - General (Internal Medicine) Devora, [facility] Helga Slice, MD as Consulting Physician (Dermatology) Caresse Cough, MD as Referring Physician (Ophthalmology) Bond, Reyes Mcardle, MD as Referring Physician (Ophthalmology) Charlanne Fredia CROME, MD as Referring Physician (Internal Medicine)  Extended Emergency Contact Information Primary Emergency Contact: Amato,Steve  [place] Home Phone: [phone] Relation: Son  Code Status:  DNR Goals of care: Advanced Directive information    01/27/2024    8:48 AM  Advanced Directives  Does Patient Have a Medical Advance Directive? Yes  Type of Estate agent of [place];Out of facility DNR (pink MOST or yellow form)  Does patient want to make changes to medical advance directive? No - Patient declined  Copy of Healthcare Power of Attorney in Chart? Yes - validated most recent copy scanned in chart (See row information)     Chief Complaint  Patient presents with   Medical Management of Chronic Issues    HPI:  Pt is a 89 y.o. male seen today for medical management of chronic diseases.    He currently resides on the skilled nursing unit at [facility]. PMH: HTN, HLD, TIA, mixed hearing loss, actinic keratosis, glaucoma, joint pain, and depression.   Followed by Hospice. Morphine  concentrate and ativan  prn.   HTN- BUN/creat 24/1.1 09/21/2023, not on medication  TIA- last episode reported 10/18/2012, not on medication Depression- not on medication  Dementia- MMSE 11/30 09/25/2022, MRI brain 12/2017 revealed age related cerebral atrophy with moderate to advanced chronic small vessel ischemic disease, no behaviors, ambulates with wheelchair, 1+ assist with ADLs, not on medication  No recent falls or  injuries.   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/18/2012   Deafness 10/18/2012   Endothelial corneal dystrophy    Essential hypertension 10/18/2012   Hammer toe 04/24/2014   Hyperlipidemia 10/18/2012   Intermediate stage nonexudative age-related macular degeneration of both eyes 04/16/2016   Nocturia 05/19/2016   Occlusion and stenosis of carotid artery with cerebral infarction    Other specified cardiac dysrhythmias(427.89)    Pain in joint, pelvic region and thigh    Paresthesia 04/30/2015   Toes of both feet    Peripheral vascular disease, unspecified (HCC)    Primary open angle glaucoma 09/25/2011   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   Rosacea    Seborrheic keratosis 04/24/2014   Syncope 06/27/2013   TIA (transient ischemic attack) 10/18/2012   States he has had about 5  times   Urine frequency    Past Surgical History:  Procedure Laterality Date   CATARACT EXTRACTION  2005   bilateral   CORNEAL TRANSPLANT  2010 left; 2013 right eye   HERNIA REPAIR  1985   Left   HERNIA REPAIR  03/2004   Right   SHOULDER DEBRIDEMENT Left 2005   TONSILLECTOMY Bilateral childhood   TRANSURETHRAL  RESECTION OF PROSTATE N/A 10/2003   VASECTOMY      No Known Allergies  Outpatient Encounter Medications as of 02/28/2024  Medication Sig   ammonium lactate (LAC-HYDRIN) 12 % lotion Apply 1 Application topically as needed for dry skin (Apply to arms, legs and back for rough, dry skin. Avoid face and skin folds.).   dorzolamide -timolol  (COSOPT) 2-0.5 % ophthalmic solution Place 1 drop into  both eyes 2 (two) times daily.   erythromycin ophthalmic ointment Place 1 Application into the right eye 4 (four) times daily.   feeding supplement (BOOST HIGH PROTEIN) LIQD Take 1 Container by mouth daily.   latanoprost  (XALATAN ) 0.005 % ophthalmic solution Place 1 drop into the left eye at bedtime.   LORazepam  (ATIVAN ) 0.5 MG tablet Take 1 tablet (0.5 mg total) by mouth every 4 (four) hours as needed for anxiety.   morphine  (ROXANOL) 20 MG/ML concentrated solution Take 0.25 mLs (5 mg total) by mouth every 4 (four) hours as needed for severe pain (pain score 7-10), moderate pain (pain score 4-6) or shortness of breath.   zinc oxide 20 % ointment Apply 1 Application topically as needed for irritation.   No facility-administered encounter medications on file as of 02/28/2024.    Review of Systems  Unable to perform ROS: Dementia    Immunization History  Administered Date(s) Administered   Influenza Whole 05/03/2002, 04/19/2012, 04/20/2013   Influenza, High Dose Seasonal PF 05/03/2019, 05/13/2022, 05/18/2023   Influenza,inj,Quad PF,6+ Mos 04/21/2018   Influenza-Unspecified 05/03/2014, 04/18/2015, 04/30/2016, 04/18/2017, 04/09/2020, 05/13/2021   Moderna Covid-19 Vaccine Bivalent Booster 40yrs & up 05/26/2023   Moderna SARS-COV2 Booster Vaccination 12/25/2020   Moderna Sars-Covid-2 Vaccination 07/24/2019, 08/21/2019, 06/03/2020, 12/31/2021, 05/26/2022   Pfizer Covid-19 Vaccine Bivalent Booster 40yrs & up 04/09/2021   Pneumococcal Conjugate-13 03/20/2010   Pneumococcal Polysaccharide-23 11/02/2017   Td 02/24/2018   Tdap 06/27/2011   Zoster Recombinant(Shingrix) 12/24/2017, 03/09/2018   Zoster, Live 11/30/2006   Pertinent  Health Maintenance Due  Topic Date Due   INFLUENZA VACCINE  02/18/2024      07/29/2022    9:40 AM 09/21/2022    3:05 PM 06/25/2023    2:13 PM 10/03/2023    6:11 PM 11/22/2023    3:54 PM  Fall Risk  Falls in the past year? 0 1 0 1 1  Was there an injury with Fall? 0 0  0 0 0  Fall Risk Category Calculator 0 1 0 1 2  Fall Risk Category (Retired) Low       (RETIRED) Patient Fall Risk Level Moderate fall risk       Patient at Risk for Falls Due to History of fall(s);Impaired balance/gait;Impaired mobility History of fall(s);Impaired balance/gait;Impaired mobility History of fall(s);Impaired balance/gait;Impaired mobility History of fall(s);Impaired balance/gait History of fall(s);Impaired balance/gait  Fall risk Follow up Falls evaluation completed  Falls evaluation completed;Falls prevention discussed;Education provided Falls evaluation completed;Education provided;Falls prevention discussed Falls evaluation completed;Education provided Falls evaluation completed;Education provided     Data saved with a previous flowsheet row definition   Functional Status Survey:    Vitals:   02/28/24 0942  BP: (!) 149/82  Pulse: 91  Resp: 18  Temp: (!) 97 F (36.1 C)  SpO2: 99%  Weight: 148 lb 6.4 oz (67.3 kg)  Height: 5' 7 (1.702 m)   Body mass index is 23.24 kg/m. Physical Exam Vitals reviewed.  Constitutional:      General: He is not in acute distress. HENT:     Head: Normocephalic.  Right Ear: There is no impacted cerumen.     Left Ear: There is no impacted cerumen.     Nose: Nose normal.     Mouth/Throat:     Mouth: Mucous membranes are moist.  Eyes:     General:        Right eye: No discharge.        Left eye: No discharge.  Cardiovascular:     Rate and Rhythm: Normal rate and regular rhythm.     Pulses: Normal pulses.     Heart sounds: Normal heart sounds.  Pulmonary:     Effort: Pulmonary effort is normal.     Breath sounds: Normal breath sounds.  Abdominal:     General: Bowel sounds are normal.     Palpations: Abdomen is soft.  Musculoskeletal:     Cervical back: Neck supple.     Right lower leg: No edema.     Left lower leg: No edema.  Skin:    General: Skin is warm.     Capillary Refill: Capillary refill takes less than 2  seconds.  Neurological:     General: No focal deficit present.     Mental Status: He is alert. Mental status is at baseline.     Motor: Weakness present.     Gait: Gait abnormal.  Psychiatric:        Mood and Affect: Mood normal.     Comments: Flat affect, mainly nonverbal, follows some commands, alert to self/familiar face     Labs reviewed: Recent Labs    04/26/23 0000 09/21/23 0000  NA 139 140  K 4.1 3.9  CL 106 106  CO2 28* 23*  BUN 19 24*  CREATININE 1.0 1.1  CALCIUM  8.5* 8.7   Recent Labs    04/26/23 0000 09/21/23 0000  AST 17 20  ALT 11 12  ALKPHOS 51 54  ALBUMIN 3.1* 3.6   Recent Labs    04/26/23 0000 09/21/23 0000  WBC 6.9 19.3  HGB 13.6 14.1  HCT 40* 43  PLT 188 186   Lab Results  Component Value Date   TSH 2.28 09/28/2022   No results found for: HGBA1C Lab Results  Component Value Date   CHOL 97 04/26/2023   HDL 30 (A) 04/26/2023   LDLCALC 51 04/26/2023   TRIG 76 04/26/2023   CHOLHDL 2.9 05/09/2020    Significant Diagnostic Results in last 30 days:  No results found.  Assessment/Plan 1. Essential hypertension (Primary) - controlled without medication, goal < 150/90  2. TIA (transient ischemic attack) - noted 2014 - off asa and statin   3. Depression, recurrent (HCC) - no changes in mood - recently moved to SNF> adjusting well  - off antidepressant per Hospice  4. Severe vascular dementia without behavioral disturbance, psychotic disturbance, mood disturbance, or anxiety (HCC) - MMSE 11/30 09/2022 - followed by hospice - no behaviors - 1+ assist with some ADLS - weight stable - cont skilled nursing     Family/ staff Communication: plan discussed with nursing   Labs/tests ordered:  none

## 2024-03-14 ENCOUNTER — Non-Acute Institutional Stay (SKILLED_NURSING_FACILITY): Admitting: Orthopedic Surgery

## 2024-03-14 ENCOUNTER — Encounter: Payer: Self-pay | Admitting: Orthopedic Surgery

## 2024-03-14 DIAGNOSIS — S81812A Laceration without foreign body, left lower leg, initial encounter: Secondary | ICD-10-CM | POA: Diagnosis not present

## 2024-03-14 DIAGNOSIS — L02224 Furuncle of groin: Secondary | ICD-10-CM

## 2024-03-14 DIAGNOSIS — F01C0 Vascular dementia, severe, without behavioral disturbance, psychotic disturbance, mood disturbance, and anxiety: Secondary | ICD-10-CM | POA: Diagnosis not present

## 2024-03-14 MED ORDER — DOXYCYCLINE HYCLATE 100 MG PO TABS: 100.0000 mg | ORAL_TABLET | Freq: Two times a day (BID) | ORAL | Status: AC

## 2024-03-14 NOTE — Progress Notes (Signed)
 Location:   [facility] Room Number: 20-A Place of Service:  SNF [phone]) Provider:  Greig Cluster, NP  PCP: Charlanne Fredia CROME, MD  Patient Care Team: Charlanne Fredia CROME, MD as PCP - General (Internal Medicine) Devora, [facility] Helga Slice, MD as Consulting Physician (Dermatology) Caresse Cough, MD as Referring Physician (Ophthalmology) Bond, Reyes Mcardle, MD as Referring Physician (Ophthalmology) Charlanne Fredia CROME, MD as Referring Physician (Internal Medicine)  Extended Emergency Contact Information Primary Emergency Contact: Lovelady,Steve  [place] Home Phone: [phone] Relation: Son  Code Status:  DNR Goals of care: Advanced Directive information    03/14/2024    9:13 AM  Advanced Directives  Does Patient Have a Medical Advance Directive? Yes  Type of Estate agent of [place];Living will;Out of facility DNR (pink MOST or yellow form)  Does patient want to make changes to medical advance directive? No - Patient declined  Copy of Healthcare Power of Attorney in Chart? Yes - validated most recent copy scanned in chart (See row information)     Chief Complaint  Patient presents with   Acute Visit    Boil to thigh    HPI:  Pt is a 89 y.o. male seen today for acute visit due to right upper thigh wound.   He currently resides on the skilled nursing unit at [facility]. PMH: HTN, HLD, TIA, mixed hearing loss, actinic keratosis, glaucoma, joint pain, and depression.   He is followed by hospice.   Poor historian due to dementia. Nursing reports wound to right upper thigh. Hard nodule present. Skin appears red. He is nonverbal, but grimaces when area is touched. Afebrile. Vitals stable.     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   Cyclitis, chronic 11/18/2012   Deafness 10/18/2012   Endothelial corneal dystrophy    Essential hypertension 10/18/2012   Hammer toe 04/24/2014   Hyperlipidemia 10/18/2012   Intermediate stage nonexudative age-related macular degeneration of both eyes 04/16/2016   Nocturia 05/19/2016   Occlusion and stenosis of carotid artery with cerebral infarction    Other specified cardiac dysrhythmias(427.89)    Pain in joint, pelvic region and thigh    Paresthesia 04/30/2015   Toes of both feet    Peripheral vascular disease, unspecified (HCC)    Primary open angle glaucoma 09/25/2011   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   Rosacea    Seborrheic keratosis 04/24/2014   Syncope 06/27/2013   TIA (transient ischemic attack) 10/18/2012   States he has had about 5  times   Urine frequency    Past Surgical History:  Procedure Laterality Date   CATARACT EXTRACTION  2005   bilateral   CORNEAL TRANSPLANT  2010 left; 2013 right eye   HERNIA REPAIR  1985   Left   HERNIA REPAIR  03/2004   Right   SHOULDER DEBRIDEMENT Left 2005   TONSILLECTOMY Bilateral childhood   TRANSURETHRAL RESECTION OF PROSTATE N/A 10/2003   VASECTOMY      No Known Allergies  Allergies as of 03/14/2024   No Known Allergies      Medication List        Accurate as of March 14, 2024  9:14 AM. If you have any questions,  ask your nurse or doctor.          ammonium lactate 12 % lotion Commonly known as: LAC-HYDRIN Apply 1 Application topically as needed for dry skin (Apply to arms, legs and back for rough, dry skin. Avoid face and skin folds.).   camphor-menthol lotion Commonly known as: SARNA Apply 1 Application topically 2 (two) times daily as needed for itching.   dorzolamide -timolol  2-0.5 % ophthalmic solution Commonly known as: COSOPT Place 1 drop into both eyes 2 (two) times daily.   erythromycin ophthalmic ointment Place 1 Application into the  right eye 4 (four) times daily.   feeding supplement Liqd Take 1 Container by mouth daily.   latanoprost  0.005 % ophthalmic solution Commonly known as: XALATAN  Place 1 drop into the left eye at bedtime.   LORazepam  0.5 MG tablet Commonly known as: Ativan  Take 1 tablet (0.5 mg total) by mouth every 4 (four) hours as needed for anxiety.   morphine  20 MG/ML concentrated solution Commonly known as: ROXANOL Take 0.25 mLs (5 mg total) by mouth every 4 (four) hours as needed for severe pain (pain score 7-10), moderate pain (pain score 4-6) or shortness of breath.   zinc oxide 20 % ointment Apply 1 Application topically as needed for irritation.        Review of Systems  Unable to perform ROS: Patient nonverbal    Immunization History  Administered Date(s) Administered   INFLUENZA, HIGH DOSE SEASONAL PF 05/03/2019, 05/13/2022, 05/18/2023   Influenza Whole 05/03/2002, 04/19/2012, 04/20/2013   Influenza,inj,Quad PF,6+ Mos 04/21/2018   Influenza-Unspecified 05/03/2014, 04/18/2015, 04/30/2016, 04/18/2017, 04/09/2020, 05/13/2021   Moderna Covid-19 Vaccine Bivalent Booster 63yrs & up 05/26/2023   Moderna SARS-COV2 Booster Vaccination 12/25/2020   Moderna Sars-Covid-2 Vaccination 07/24/2019, 08/21/2019, 06/03/2020, 12/31/2021, 05/26/2022   Pfizer Covid-19 Vaccine Bivalent Booster 54yrs & up 04/09/2021   Pneumococcal Conjugate-13 03/20/2010   Pneumococcal Polysaccharide-23 11/02/2017   Td 02/24/2018   Tdap 06/27/2011   Zoster Recombinant(Shingrix) 12/24/2017, 03/09/2018   Zoster, Live 11/30/2006   Pertinent  Health Maintenance Due  Topic Date Due   INFLUENZA VACCINE  02/18/2024      07/29/2022    9:40 AM 09/21/2022    3:05 PM 06/25/2023    2:13 PM 10/03/2023    6:11 PM 11/22/2023    3:54 PM  Fall Risk  Falls in the past year? 0 1 0 1 1  Was there an injury with Fall? 0 0 0 0 0  Fall Risk Category Calculator 0 1 0 1 2  Fall Risk Category (Retired) Low       (RETIRED) Patient Fall  Risk Level Moderate fall risk       Patient at Risk for Falls Due to History of fall(s);Impaired balance/gait;Impaired mobility History of fall(s);Impaired balance/gait;Impaired mobility History of fall(s);Impaired balance/gait;Impaired mobility History of fall(s);Impaired balance/gait History of fall(s);Impaired balance/gait  Fall risk Follow up Falls evaluation completed  Falls evaluation completed;Falls prevention discussed;Education provided Falls evaluation completed;Education provided;Falls prevention discussed Falls evaluation completed;Education provided Falls evaluation completed;Education provided     Data saved with a previous flowsheet row definition   Functional Status Survey:    Vitals:   03/14/24 0909  BP: 129/68  Pulse: 63  Resp: 16  Temp: 97.6 F (36.4 C)  SpO2: 95%  Weight: 148 lb 6.4 oz (67.3 kg)  Height: 5' 7 (1.702 m)   Body mass index is 23.24 kg/m. Physical Exam Vitals reviewed.  Constitutional:      General: He is not in acute distress.  HENT:     Head: Normocephalic.  Eyes:     General:        Right eye: No discharge.        Left eye: No discharge.  Cardiovascular:     Rate and Rhythm: Normal rate and regular rhythm.     Pulses: Normal pulses.     Heart sounds: Normal heart sounds.  Pulmonary:     Effort: Pulmonary effort is normal.     Breath sounds: Normal breath sounds.  Abdominal:     Palpations: Abdomen is soft.  Musculoskeletal:     Cervical back: Neck supple.     Right lower leg: No edema.     Left lower leg: No edema.  Skin:    General: Skin is warm.     Capillary Refill: Capillary refill takes less than 2 seconds.     Comments: Skin tear left shin, CDI, no infection. Approx 1-2 cm hard nodule to right upper thigh/groin, erythema and tenderness present, no drainage.  Neurological:     General: No focal deficit present.     Mental Status: He is alert. Mental status is at baseline.     Gait: Gait abnormal.  Psychiatric:        Mood  and Affect: Mood normal.     Comments: Nonverbal, alert to self, does not follow commands     Labs reviewed: Recent Labs    04/26/23 0000 09/21/23 0000  NA 139 140  K 4.1 3.9  CL 106 106  CO2 28* 23*  BUN 19 24*  CREATININE 1.0 1.1  CALCIUM  8.5* 8.7   Recent Labs    04/26/23 0000 09/21/23 0000  AST 17 20  ALT 11 12  ALKPHOS 51 54  ALBUMIN 3.1* 3.6   Recent Labs    04/26/23 0000 09/21/23 0000  WBC 6.9 19.3  HGB 13.6 14.1  HCT 40* 43  PLT 188 186   Lab Results  Component Value Date   TSH 2.28 09/28/2022   No results found for: HGBA1C Lab Results  Component Value Date   CHOL 97 04/26/2023   HDL 30 (A) 04/26/2023   LDLCALC 51 04/26/2023   TRIG 76 04/26/2023   CHOLHDL 2.9 05/09/2020    Significant Diagnostic Results in last 30 days:  No results found.  Assessment/Plan 1. Boil of groin (Primary) - noted 08/25 - located right upper thigh/groin  - hard nodule, erythema and tenderness noted  - doxycycline  (VIBRA -TABS) 100 MG tablet; Take 1 tablet (100 mg total) by mouth 2 (two) times daily for 10 days.  2. Severe vascular dementia without behavioral disturbance, psychotic disturbance, mood disturbance, or anxiety (HCC) - followed by hospice - no behaviors - dependent with ADLs including feeding  - not on medication - cont skilled nursing   3. Skin tear of left lower leg without complication, initial encounter - located left anterior shin - DOI unknown per chart review - no sign on infection at this time    Family/ staff Communication: plan discussed with patient and nurse  Labs/tests ordered:  none

## 2024-03-27 ENCOUNTER — Non-Acute Institutional Stay (SKILLED_NURSING_FACILITY): Payer: Self-pay | Admitting: Orthopedic Surgery

## 2024-03-27 ENCOUNTER — Encounter: Payer: Self-pay | Admitting: Orthopedic Surgery

## 2024-03-27 DIAGNOSIS — I10 Essential (primary) hypertension: Secondary | ICD-10-CM

## 2024-03-27 DIAGNOSIS — R634 Abnormal weight loss: Secondary | ICD-10-CM

## 2024-03-27 DIAGNOSIS — L02224 Furuncle of groin: Secondary | ICD-10-CM | POA: Diagnosis not present

## 2024-03-27 DIAGNOSIS — Z8673 Personal history of transient ischemic attack (TIA), and cerebral infarction without residual deficits: Secondary | ICD-10-CM

## 2024-03-27 DIAGNOSIS — F339 Major depressive disorder, recurrent, unspecified: Secondary | ICD-10-CM | POA: Diagnosis not present

## 2024-03-27 DIAGNOSIS — F01C0 Vascular dementia, severe, without behavioral disturbance, psychotic disturbance, mood disturbance, and anxiety: Secondary | ICD-10-CM

## 2024-03-27 NOTE — Progress Notes (Signed)
 Location:   [facility]  Nursing Home Room Number: 20-A Place of Service:  SNF [phone]) Provider:  Greig Cluster, NP  PCP: Charlanne Fredia CROME, MD  Patient Care Team: Charlanne Fredia CROME, MD as PCP - General (Internal Medicine) Devora, [facility] Helga Slice, MD as Consulting Physician (Dermatology) Caresse Cough, MD as Referring Physician (Ophthalmology) Bond, Reyes Mcardle, MD as Referring Physician (Ophthalmology) Charlanne Fredia CROME, MD as Referring Physician (Internal Medicine)  Extended Emergency Contact Information Primary Emergency Contact: Korb,Steve  [place] Home Phone: [phone] Relation: Son  Code Status:  DNR Goals of care: Advanced Directive information    03/27/2024   11:39 AM  Advanced Directives  Does Patient Have a Medical Advance Directive? Yes  Type of Estate agent of [place];Living will;Out of facility DNR (pink MOST or yellow form)  Does patient want to make changes to medical advance directive? No - Patient declined  Copy of Healthcare Power of Attorney in Chart? Yes - validated most recent copy scanned in chart (See row information)     Chief Complaint  Patient presents with   Medical Management of Chronic Issues    Routine visit. Discuss the need for Influenza vaccine, and Covid Booster.     HPI:  Pt is a 89 y.o. male seen today for medical management of chronic diseases.    He currently resides on the skilled nursing unit at [facility] PMH: HTN, HLD, TIA, mixed hearing loss, actinic keratosis, glaucoma, joint pain, and depression.    Followed by Hospice.   Boil- 08/26 started on doxycycline  x 10 day, almost healed today  HTN- BUN/creat 24/1.1 09/21/2023, not on medication  TIA- last episode reported 10/18/2012, not on medication Depression- not on medication  Dementia- MMSE 11/30 09/25/2022, MRI brain 12/2017 revealed age related cerebral atrophy with moderate to advanced chronic small vessel  ischemic disease, no behaviors, ambulates with wheelchair, 1+ assist with ADLs, not on medication  No recent falls or injuries.   Recent weights:  09/01- 138 lbs  08/05- 148 lbs  07/16- 147.8 lbs  Recent blood pressures:  09/02- 141/86  08/26- 151/54  08/19- 129/68      Past Medical History:  Diagnosis Date   Actinic keratosis 10/18/2012   Anisocoria 10/30/2014   Left pupil is larger than the right postsurgery corneal transplant.    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/18/2012   Deafness 10/18/2012   Endothelial corneal dystrophy    Essential hypertension 10/18/2012   Hammer toe 04/24/2014   Hyperlipidemia 10/18/2012   Intermediate stage nonexudative age-related macular degeneration of both eyes 04/16/2016   Nocturia 05/19/2016   Occlusion and stenosis of carotid artery with cerebral infarction    Other specified cardiac dysrhythmias(427.89)    Pain in joint, pelvic region and thigh    Paresthesia 04/30/2015   Toes of both feet    Peripheral vascular disease, unspecified (HCC)    Primary open angle glaucoma 09/25/2011   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   Rosacea    Seborrheic keratosis 04/24/2014   Syncope 06/27/2013   TIA (transient ischemic attack) 10/18/2012   States he has had about 5  times   Urine frequency    Past Surgical History:  Procedure Laterality Date   CATARACT EXTRACTION  2005   bilateral   CORNEAL TRANSPLANT  2010 left; 2013 right eye   HERNIA REPAIR  1985  Left   HERNIA REPAIR  03/2004   Right   SHOULDER DEBRIDEMENT Left 2005   TONSILLECTOMY Bilateral childhood   TRANSURETHRAL RESECTION OF PROSTATE N/A 10/2003   VASECTOMY      No Known Allergies  Allergies as of 03/27/2024   No Known Allergies      Medication List        Accurate as of March 27, 2024 11:40 AM. If you have any questions, ask your nurse or doctor.           ammonium lactate 12 % lotion Commonly known as: LAC-HYDRIN Apply 1 Application topically at bedtime.   camphor-menthol lotion Commonly known as: SARNA Apply 1 Application topically 2 (two) times daily as needed for itching.   dorzolamide -timolol  2-0.5 % ophthalmic solution Commonly known as: COSOPT Place 1 drop into both eyes 2 (two) times daily.   feeding supplement Liqd Take 1 Container by mouth daily.   latanoprost  0.005 % ophthalmic solution Commonly known as: XALATAN  Place 1 drop into the left eye at bedtime.   morphine  20 MG/ML concentrated solution Commonly known as: ROXANOL Take 0.25 mLs (5 mg total) by mouth every 4 (four) hours as needed for severe pain (pain score 7-10), moderate pain (pain score 4-6) or shortness of breath.   zinc oxide 20 % ointment Apply 1 Application topically as needed for irritation.        Review of Systems  Unable to perform ROS: Patient nonverbal    Immunization History  Administered Date(s) Administered   INFLUENZA, HIGH DOSE SEASONAL PF 05/03/2019, 05/13/2022, 05/18/2023   Influenza Whole 05/03/2002, 04/19/2012, 04/20/2013   Influenza,inj,Quad PF,6+ Mos 04/21/2018   Influenza-Unspecified 05/03/2014, 04/18/2015, 04/30/2016, 04/18/2017, 04/09/2020, 05/13/2021   Moderna Covid-19 Vaccine Bivalent Booster 48yrs & up 05/26/2023   Moderna SARS-COV2 Booster Vaccination 12/25/2020   Moderna Sars-Covid-2 Vaccination 07/24/2019, 08/21/2019, 06/03/2020, 12/31/2021, 05/26/2022   Pfizer Covid-19 Vaccine Bivalent Booster 45yrs & up 04/09/2021   Pneumococcal Conjugate-13 03/20/2010   Pneumococcal Polysaccharide-23 11/02/2017   Td 02/24/2018   Tdap 06/27/2011   Zoster Recombinant(Shingrix) 12/24/2017, 03/09/2018   Zoster, Live 11/30/2006   Pertinent  Health Maintenance Due  Topic Date Due   Influenza Vaccine  02/18/2024      07/29/2022    9:40 AM 09/21/2022    3:05 PM 06/25/2023    2:13 PM 10/03/2023    6:11 PM 11/22/2023    3:54 PM   Fall Risk  Falls in the past year? 0 1 0 1 1  Was there an injury with Fall? 0 0 0 0 0  Fall Risk Category Calculator 0 1 0 1 2  Fall Risk Category (Retired) Low       (RETIRED) Patient Fall Risk Level Moderate fall risk       Patient at Risk for Falls Due to History of fall(s);Impaired balance/gait;Impaired mobility History of fall(s);Impaired balance/gait;Impaired mobility History of fall(s);Impaired balance/gait;Impaired mobility History of fall(s);Impaired balance/gait History of fall(s);Impaired balance/gait  Fall risk Follow up Falls evaluation completed  Falls evaluation completed;Falls prevention discussed;Education provided Falls evaluation completed;Education provided;Falls prevention discussed Falls evaluation completed;Education provided Falls evaluation completed;Education provided     Data saved with a previous flowsheet row definition   Functional Status Survey:    Vitals:   03/27/24 1137  BP: (!) 141/86  Pulse: 83  Resp: 19  Temp: (!) 97.4 F (36.3 C)  SpO2: 97%  Weight: 138 lb (62.6 kg)  Height: 5' 7 (1.702 m)   Body mass index is 21.61 kg/m. Physical  Exam Vitals reviewed.  Constitutional:      General: He is not in acute distress. HENT:     Head: Normocephalic.     Right Ear: There is no impacted cerumen.     Left Ear: There is no impacted cerumen.     Nose: Nose normal.     Mouth/Throat:     Mouth: Mucous membranes are moist.  Eyes:     General:        Right eye: No discharge.        Left eye: No discharge.  Cardiovascular:     Rate and Rhythm: Normal rate and regular rhythm.     Pulses: Normal pulses.     Heart sounds: Normal heart sounds.  Pulmonary:     Effort: Pulmonary effort is normal. No respiratory distress.     Breath sounds: Normal breath sounds. No wheezing or rales.  Abdominal:     General: Bowel sounds are normal. There is no distension.     Palpations: Abdomen is soft.     Tenderness: There is no abdominal tenderness.   Musculoskeletal:     Cervical back: Neck supple.     Right lower leg: No edema.     Left lower leg: No edema.  Skin:    Capillary Refill: Capillary refill takes less than 2 seconds.     Findings: Lesion present.     Comments: Boil to right upper thigh/groin nontender, almost healed  Neurological:     General: No focal deficit present.     Mental Status: He is alert. Mental status is at baseline.     Motor: Weakness present.     Gait: Gait abnormal.  Psychiatric:        Mood and Affect: Mood normal.     Comments: Nonverbal, does not follow commands     Labs reviewed: Recent Labs    04/26/23 0000 09/21/23 0000  NA 139 140  K 4.1 3.9  CL 106 106  CO2 28* 23*  BUN 19 24*  CREATININE 1.0 1.1  CALCIUM  8.5* 8.7   Recent Labs    04/26/23 0000 09/21/23 0000  AST 17 20  ALT 11 12  ALKPHOS 51 54  ALBUMIN 3.1* 3.6   Recent Labs    04/26/23 0000 09/21/23 0000  WBC 6.9 19.3  HGB 13.6 14.1  HCT 40* 43  PLT 188 186   Lab Results  Component Value Date   TSH 2.28 09/28/2022   No results found for: HGBA1C Lab Results  Component Value Date   CHOL 97 04/26/2023   HDL 30 (A) 04/26/2023   LDLCALC 51 04/26/2023   TRIG 76 04/26/2023   CHOLHDL 2.9 05/09/2020    Significant Diagnostic Results in last 30 days:  No results found.  Assessment/Plan 1. Boil of groin (Primary) - 08/26 noted to right upper thigh/buttock - 09/06 doxycycline  x 10 days completed - almost healed today  2. Essential hypertension - controlled without medication  3. History of TIA (transient ischemic attack) - noted 2014 - off asa and statin  4. Depression, recurrent (HCC) - no changes in mood - off antidepressant per hospice  5. Severe vascular dementia without behavioral disturbance, psychotic disturbance, mood disturbance, or anxiety (HCC) - followed by hospice - MMSE 11/30 09/2022 - no behaviors - nonverbal or aphasia - 10 lbs weight loss within past month - not on  medication - cont skilled nursing  6. Weight loss - BMI 21.61 - weight down 10 lbs within last  month - followed by dietary - cont monthly weights     Family/ staff Communication: plan discussed with patient and nurse  Labs/tests ordered:  none

## 2024-04-20 ENCOUNTER — Non-Acute Institutional Stay (SKILLED_NURSING_FACILITY): Payer: Self-pay | Admitting: Internal Medicine

## 2024-04-20 ENCOUNTER — Encounter: Payer: Self-pay | Admitting: Internal Medicine

## 2024-04-20 DIAGNOSIS — F01C0 Vascular dementia, severe, without behavioral disturbance, psychotic disturbance, mood disturbance, and anxiety: Secondary | ICD-10-CM

## 2024-04-20 DIAGNOSIS — I639 Cerebral infarction, unspecified: Secondary | ICD-10-CM

## 2024-04-20 DIAGNOSIS — R4701 Aphasia: Secondary | ICD-10-CM | POA: Diagnosis not present

## 2024-04-20 NOTE — Progress Notes (Signed)
 [facility]:   SNF  Provider:   Code Status: DNR [place] Goals of Care:     03/27/2024   11:39 AM  Advanced Directives  Does Patient Have a Medical Advance Directive? Yes  Type of Estate agent of [place];Living will;Out of facility DNR (pink MOST or yellow form)  Does patient want to make changes to medical advance directive? No - Patient declined  Copy of Healthcare Power of Attorney in Chart? Yes - validated most recent copy scanned in chart (See row information)     Seen For Routine Visit   HPI: Patient is a 89 y.o. male seen today for medical management of chronic diseases.    In SNF in [facility] Patient has h/o Hypertension and Depression, TIA, Skin cancer TIA With Aphasia and Word finding.He has had detail work ups including EEG, Echo and MRI .  Continues to have issues with Aphasia Cognition Now Enrolled in Hospice Stays in Wheelchair Need help with his ADLS including Cueing for feeding Wt Readings from Last 3 Encounters:  04/20/24 145 lb (65.8 kg)  03/27/24 138 lb (62.6 kg)  03/14/24 148 lb 6.4 oz (67.3 kg)      Past Medical History:  Diagnosis Date   Actinic keratosis 10/18/2012   Anisocoria 10/30/2014   Left pupil is larger than the right postsurgery corneal transplant.    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/18/2012   Deafness 10/18/2012   Endothelial corneal dystrophy    Essential hypertension 10/18/2012   Hammer toe 04/24/2014   Hyperlipidemia 10/18/2012   Intermediate stage nonexudative age-related macular degeneration of both eyes 04/16/2016   Nocturia 05/19/2016   Occlusion and stenosis of carotid artery with cerebral infarction    Other specified cardiac dysrhythmias(427.89)    Pain in joint, pelvic region and thigh    Paresthesia 04/30/2015   Toes of both feet    Peripheral  vascular disease, unspecified    Primary open angle glaucoma 09/25/2011   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   Rosacea    Seborrheic keratosis 04/24/2014   Syncope 06/27/2013   TIA (transient ischemic attack) 10/18/2012   States he has had about 5  times   Urine frequency     Past Surgical History:  Procedure Laterality Date   CATARACT EXTRACTION  2005   bilateral   CORNEAL TRANSPLANT  2010 left; 2013 right eye   HERNIA REPAIR  1985   Left   HERNIA REPAIR  03/2004   Right   SHOULDER DEBRIDEMENT Left 2005   TONSILLECTOMY Bilateral childhood   TRANSURETHRAL RESECTION OF PROSTATE N/A 10/2003   VASECTOMY      No Known Allergies  Outpatient Encounter Medications as of 04/20/2024  Medication Sig   ammonium lactate (LAC-HYDRIN) 12 % lotion Apply 1 Application topically at bedtime.   camphor-menthol (SARNA) lotion Apply 1 Application topically 2 (two) times daily as needed for itching.   dorzolamide -timolol  (COSOPT) 2-0.5 % ophthalmic solution Place 1 drop into both eyes 2 (two) times daily.   feeding supplement (BOOST HIGH PROTEIN) LIQD Take 1 Container by mouth daily.   latanoprost  (XALATAN ) 0.005 % ophthalmic solution Place 1 drop into the left eye at bedtime.   morphine  (ROXANOL) 20 MG/ML concentrated solution Take 0.25 mLs (5 mg total) by mouth every 4 (four) hours as needed for severe pain (pain score 7-10), moderate  pain (pain score 4-6) or shortness of breath.   zinc oxide 20 % ointment Apply 1 Application topically as needed for irritation.   No facility-administered encounter medications on file as of 04/20/2024.    Review of Systems:  Review of Systems  Unable to perform ROS: Dementia    Health Maintenance  Topic Date Due   Influenza Vaccine  02/18/2024   COVID-19 Vaccine (8 - 2025-26 season) 03/20/2024   DTaP/Tdap/Td (3 - Td or Tdap) 02/25/2028   Pneumococcal Vaccine: 50+ Years  Completed   Zoster Vaccines- Shingrix  Completed   HPV VACCINES  Aged Out   Meningococcal B Vaccine   Aged Out    Physical Exam: Vitals:   04/20/24 2022  BP: 128/64  Pulse: 75  Resp: 18  Temp: 98.4 F (36.9 C)  Weight: 145 lb (65.8 kg)   Body mass index is 22.71 kg/m. Physical Exam Vitals reviewed.  Constitutional:      Appearance: Normal appearance.  HENT:     Head: Normocephalic.     Nose: Nose normal.     Mouth/Throat:     Mouth: Mucous membranes are moist.     Pharynx: Oropharynx is clear.  Eyes:     Pupils: Pupils are equal, round, and reactive to light.  Cardiovascular:     Rate and Rhythm: Normal rate and regular rhythm.     Pulses: Normal pulses.     Heart sounds: No murmur heard. Pulmonary:     Effort: Pulmonary effort is normal. No respiratory distress.     Breath sounds: Normal breath sounds. No rales.  Abdominal:     General: Abdomen is flat. Bowel sounds are normal.     Palpations: Abdomen is soft.  Musculoskeletal:        General: No swelling.     Cervical back: Neck supple.  Skin:    General: Skin is warm.  Neurological:     Mental Status: He is alert.     Comments: Ha Global Aphasia  Psychiatric:        Mood and Affect: Mood normal.        Thought Content: Thought content normal.     Labs reviewed: Basic Metabolic Panel: Recent Labs    04/26/23 0000 09/21/23 0000  NA 139 140  K 4.1 3.9  CL 106 106  CO2 28* 23*  BUN 19 24*  CREATININE 1.0 1.1  CALCIUM  8.5* 8.7   Liver Function Tests: Recent Labs    04/26/23 0000 09/21/23 0000  AST 17 20  ALT 11 12  ALKPHOS 51 54  ALBUMIN 3.1* 3.6   No results for input(s): LIPASE, AMYLASE in the last 8760 hours. No results for input(s): AMMONIA in the last 8760 hours. CBC: Recent Labs    04/26/23 0000 09/21/23 0000  WBC 6.9 19.3  HGB 13.6 14.1  HCT 40* 43  PLT 188 186   Lipid Panel: Recent Labs    04/26/23 0000  CHOL 97  HDL 30*  LDLCALC 51  TRIG 76   No results found for: HGBA1C  Procedures since last visit: No results found.  Assessment/Plan Patient with  Recurrent CVA Global Aphasia, HTN and Dementia Now enrolled in Hospice Continue Supportive care    Labs/tests ordered:  * No order type specified * Next appt:  Visit date not found

## 2024-06-22 ENCOUNTER — Non-Acute Institutional Stay (SKILLED_NURSING_FACILITY): Payer: Self-pay | Admitting: Internal Medicine

## 2024-06-22 DIAGNOSIS — I639 Cerebral infarction, unspecified: Secondary | ICD-10-CM

## 2024-06-22 DIAGNOSIS — F01C0 Vascular dementia, severe, without behavioral disturbance, psychotic disturbance, mood disturbance, and anxiety: Secondary | ICD-10-CM

## 2024-06-22 DIAGNOSIS — R4701 Aphasia: Secondary | ICD-10-CM

## 2024-06-22 DIAGNOSIS — I10 Essential (primary) hypertension: Secondary | ICD-10-CM

## 2024-06-22 DIAGNOSIS — F339 Major depressive disorder, recurrent, unspecified: Secondary | ICD-10-CM

## 2024-06-23 ENCOUNTER — Encounter: Payer: Self-pay | Admitting: Internal Medicine

## 2024-06-23 NOTE — Progress Notes (Signed)
 [facility]  SNF ([id number])  Provider:   Code Status: DNR/hospice Goals of Care:     03/27/2024   11:39 AM  Advanced Directives  Does Patient Have a Medical Advance Directive? Yes  Type of Estate Agent of [place];Living will;Out of facility DNR (pink MOST or yellow form)  Does patient want to make changes to medical advance directive? No - Patient declined  Copy of Healthcare Power of Attorney in Chart? Yes - validated most recent copy scanned in chart (See row information)     Chief Complaint  Patient presents with   Care Management    HPI: Patient is a 89 y.o. male seen today for medical management of chronic diseases.   In SNF in [facility]  Patient has h/o Hypertension and Depression, TIA, Skin cancer TIA With Aphasia and Word finding.He has had detail work ups including EEG, Echo and MRI .  He continues to have issues with aphasia and cognition He is enrolled in hospice States in his wheelchair today patient was also trying to exit the facility. He needs help with his ADLs including cueing for feeding. Wt Readings from Last 3 Encounters:  06/22/24 136 lb (61.7 kg)  04/20/24 145 lb (65.8 kg)  03/27/24 138 lb (62.6 kg)     Past Medical History:  Diagnosis Date   Actinic keratosis 10/18/2012   Anisocoria 10/30/2014   Left pupil is larger than the right postsurgery corneal transplant.    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/18/2012   Deafness 10/18/2012   Endothelial corneal dystrophy    Essential hypertension 10/18/2012   Hammer toe 04/24/2014   Hyperlipidemia 10/18/2012   Intermediate stage nonexudative age-related macular degeneration of both eyes 04/16/2016   Nocturia 05/19/2016   Occlusion and stenosis of carotid artery with cerebral infarction    Other specified cardiac dysrhythmias(427.89)    Pain  in joint, pelvic region and thigh    Paresthesia 04/30/2015   Toes of both feet    Peripheral vascular disease, unspecified    Primary open angle glaucoma 09/25/2011   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   Rosacea    Seborrheic keratosis 04/24/2014   Syncope 06/27/2013   TIA (transient ischemic attack) 10/18/2012   States he has had about 5  times   Urine frequency     Past Surgical History:  Procedure Laterality Date   CATARACT EXTRACTION  2005   bilateral   CORNEAL TRANSPLANT  2010 left; 2013 right eye   HERNIA REPAIR  1985   Left   HERNIA REPAIR  03/2004   Right   SHOULDER DEBRIDEMENT Left 2005   TONSILLECTOMY Bilateral childhood   TRANSURETHRAL RESECTION OF PROSTATE N/A 10/2003   VASECTOMY      No Known Allergies  Outpatient Encounter Medications as of 06/22/2024  Medication Sig   ammonium lactate (LAC-HYDRIN) 12 % lotion Apply 1 Application topically at bedtime.   camphor-menthol (SARNA) lotion Apply 1 Application topically 2 (two) times daily as needed for itching.   dorzolamide -timolol  (COSOPT) 2-0.5 % ophthalmic solution Place 1 drop into both eyes 2 (two) times daily.   feeding supplement (BOOST HIGH PROTEIN) LIQD Take 1 Container by mouth daily.   latanoprost  (XALATAN ) 0.005 % ophthalmic solution Place 1 drop into the left eye at bedtime.   morphine  (ROXANOL) 20 MG/ML concentrated solution Take 0.25 mLs (5 mg  total) by mouth every 4 (four) hours as needed for severe pain (pain score 7-10), moderate pain (pain score 4-6) or shortness of breath.   zinc oxide 20 % ointment Apply 1 Application topically as needed for irritation.   No facility-administered encounter medications on file as of 06/22/2024.    Review of Systems:  Review of Systems  Unable to perform ROS: Dementia    Health Maintenance  Topic Date Due   Influenza Vaccine  02/18/2024   COVID-19 Vaccine (8 - 2025-26 season) 03/20/2024   DTaP/Tdap/Td (3 - Td or Tdap) 02/25/2028   Pneumococcal Vaccine: 50+ Years   Completed   Zoster Vaccines- Shingrix  Completed   Meningococcal B Vaccine  Aged Out    Physical Exam: Vitals:   06/22/24 1059  BP: (!) 130/56  Pulse: 60  Resp: 16  Temp: 98 F (36.7 C)  Weight: 136 lb (61.7 kg)   Body mass index is 21.3 kg/m. Physical Exam Vitals reviewed.  Constitutional:      Appearance: Normal appearance.  HENT:     Head: Normocephalic.     Nose: Nose normal.     Mouth/Throat:     Mouth: Mucous membranes are moist.     Pharynx: Oropharynx is clear.  Eyes:     Pupils: Pupils are equal, round, and reactive to light.  Cardiovascular:     Rate and Rhythm: Normal rate and regular rhythm.     Pulses: Normal pulses.     Heart sounds: No murmur heard. Pulmonary:     Effort: Pulmonary effort is normal. No respiratory distress.     Breath sounds: Normal breath sounds. No rales.  Abdominal:     General: Abdomen is flat. Bowel sounds are normal.     Palpations: Abdomen is soft.  Musculoskeletal:        General: No swelling.     Cervical back: Neck supple.  Skin:    General: Skin is warm.     Comments: Rosacea on his Cheeks  Neurological:     Mental Status: He is alert.     Comments: Has global aphasia  Psychiatric:        Mood and Affect: Mood normal.        Thought Content: Thought content normal.     Labs reviewed: Basic Metabolic Panel: Recent Labs    09/21/23 0000  NA 140  K 3.9  CL 106  CO2 23*  BUN 24*  CREATININE 1.1  CALCIUM  8.7   Liver Function Tests: Recent Labs    09/21/23 0000  AST 20  ALT 12  ALKPHOS 54  ALBUMIN 3.6   No results for input(s): LIPASE, AMYLASE in the last 8760 hours. No results for input(s): AMMONIA in the last 8760 hours. CBC: Recent Labs    09/21/23 0000  WBC 19.3  HGB 14.1  HCT 43  PLT 186   Lipid Panel: No results for input(s): CHOL, HDL, LDLCALC, TRIG, CHOLHDL, LDLDIRECT in the last 8760 hours. No results found for: HGBA1C  Procedures since last visit: No results  found.  Assessment/Plan 1. Global aphasia (Primary)   2. Recurrent cerebrovascular accidents (CVAs) (HCC)   3. Severe vascular dementia without behavioral disturbance, psychotic disturbance, mood disturbance, or anxiety (HCC)   4. Essential hypertension   5. Depression, recurrent  He is Off All meds Enrolled in Hospice   Labs/tests ordered:  * No order type specified * Next appt:  Visit date not found
